# Patient Record
Sex: Female | Born: 1935
Health system: Southern US, Community
[De-identification: ages and names within clinical notes are randomized; demographics above are authoritative.]

## PROBLEM LIST (undated history)

## (undated) DIAGNOSIS — I739 Peripheral vascular disease, unspecified: Secondary | ICD-10-CM

## (undated) DIAGNOSIS — I5032 Chronic diastolic (congestive) heart failure: Secondary | ICD-10-CM

## (undated) DIAGNOSIS — I6529 Occlusion and stenosis of unspecified carotid artery: Secondary | ICD-10-CM

## (undated) DIAGNOSIS — K2970 Gastritis, unspecified, without bleeding: Secondary | ICD-10-CM

## (undated) DIAGNOSIS — J4489 Other specified chronic obstructive pulmonary disease: Secondary | ICD-10-CM

## (undated) DIAGNOSIS — I4891 Unspecified atrial fibrillation: Secondary | ICD-10-CM

## (undated) DIAGNOSIS — Z9289 Personal history of other medical treatment: Secondary | ICD-10-CM

## (undated) DIAGNOSIS — J449 Chronic obstructive pulmonary disease, unspecified: Secondary | ICD-10-CM

## (undated) DIAGNOSIS — R0602 Shortness of breath: Secondary | ICD-10-CM

## (undated) DIAGNOSIS — R609 Edema, unspecified: Secondary | ICD-10-CM

## (undated) DIAGNOSIS — M1990 Unspecified osteoarthritis, unspecified site: Secondary | ICD-10-CM

## (undated) DIAGNOSIS — K2990 Gastroduodenitis, unspecified, without bleeding: Secondary | ICD-10-CM

## (undated) DIAGNOSIS — I421 Obstructive hypertrophic cardiomyopathy: Secondary | ICD-10-CM

## (undated) DIAGNOSIS — I10 Essential (primary) hypertension: Secondary | ICD-10-CM

## (undated) DIAGNOSIS — J189 Pneumonia, unspecified organism: Secondary | ICD-10-CM

## (undated) DIAGNOSIS — E7800 Pure hypercholesterolemia, unspecified: Secondary | ICD-10-CM

## (undated) HISTORY — PX: CATARACT EXTRACTION W/ INTRAOCULAR LENS  IMPLANT, BILATERAL: SHX1307

## (undated) HISTORY — PX: PARTIAL GASTRECTOMY: SHX2172

## (undated) HISTORY — PX: PAROTID GLAND TUMOR EXCISION: SHX5221

## (undated) HISTORY — DX: Edema, unspecified: R60.9

## (undated) HISTORY — DX: Chronic obstructive pulmonary disease, unspecified: J44.9

## (undated) HISTORY — PX: CHOLECYSTECTOMY: SHX55

## (undated) HISTORY — DX: Essential (primary) hypertension: I10

## (undated) HISTORY — DX: Gastritis, unspecified, without bleeding: K29.70

## (undated) HISTORY — PX: CARDIAC CATHETERIZATION: SHX172

## (undated) HISTORY — DX: Gastroduodenitis, unspecified, without bleeding: K29.90

## (undated) HISTORY — DX: Unspecified atrial fibrillation: I48.91

## (undated) HISTORY — DX: Peripheral vascular disease, unspecified: I73.9

## (undated) HISTORY — DX: Obstructive hypertrophic cardiomyopathy: I42.1

## (undated) HISTORY — DX: Occlusion and stenosis of unspecified carotid artery: I65.29

## (undated) HISTORY — DX: Other specified chronic obstructive pulmonary disease: J44.89

## (undated) HISTORY — DX: Obstructive hypertrophic cardiomyopathy: I50.32

## (undated) HISTORY — PX: SHOULDER OPEN ROTATOR CUFF REPAIR: SHX2407

## (undated) HISTORY — PX: APPENDECTOMY: SHX54

## (undated) HISTORY — PX: TOTAL ABDOMINAL HYSTERECTOMY: SHX209

---

## 1997-06-17 ENCOUNTER — Ambulatory Visit (HOSPITAL_COMMUNITY): Admission: RE | Admit: 1997-06-17 | Discharge: 1997-06-17 | Payer: Self-pay | Admitting: *Deleted

## 1997-07-31 ENCOUNTER — Ambulatory Visit (HOSPITAL_COMMUNITY): Admission: RE | Admit: 1997-07-31 | Discharge: 1997-07-31 | Payer: Self-pay | Admitting: *Deleted

## 1998-06-01 ENCOUNTER — Ambulatory Visit (HOSPITAL_COMMUNITY): Admission: RE | Admit: 1998-06-01 | Discharge: 1998-06-01 | Payer: Self-pay | Admitting: *Deleted

## 1998-07-25 ENCOUNTER — Encounter: Payer: Self-pay | Admitting: *Deleted

## 1998-07-25 ENCOUNTER — Ambulatory Visit (HOSPITAL_COMMUNITY): Admission: RE | Admit: 1998-07-25 | Discharge: 1998-07-25 | Payer: Self-pay | Admitting: *Deleted

## 1998-09-24 ENCOUNTER — Ambulatory Visit (HOSPITAL_COMMUNITY): Admission: RE | Admit: 1998-09-24 | Discharge: 1998-09-24 | Payer: Self-pay | Admitting: *Deleted

## 1998-11-12 ENCOUNTER — Ambulatory Visit (HOSPITAL_COMMUNITY): Admission: RE | Admit: 1998-11-12 | Discharge: 1998-11-12 | Payer: Self-pay | Admitting: *Deleted

## 1998-11-12 ENCOUNTER — Encounter: Payer: Self-pay | Admitting: *Deleted

## 1998-11-23 ENCOUNTER — Ambulatory Visit (HOSPITAL_COMMUNITY): Admission: RE | Admit: 1998-11-23 | Discharge: 1998-11-23 | Payer: Self-pay | Admitting: *Deleted

## 1998-11-23 ENCOUNTER — Encounter: Payer: Self-pay | Admitting: *Deleted

## 1998-11-27 ENCOUNTER — Ambulatory Visit (HOSPITAL_COMMUNITY): Admission: RE | Admit: 1998-11-27 | Discharge: 1998-11-27 | Payer: Self-pay | Admitting: *Deleted

## 1999-01-01 ENCOUNTER — Encounter: Payer: Self-pay | Admitting: Neurology

## 1999-01-01 ENCOUNTER — Encounter: Admission: RE | Admit: 1999-01-01 | Discharge: 1999-01-01 | Payer: Self-pay | Admitting: Neurology

## 1999-01-04 ENCOUNTER — Encounter (HOSPITAL_BASED_OUTPATIENT_CLINIC_OR_DEPARTMENT_OTHER): Payer: Self-pay | Admitting: General Surgery

## 1999-01-07 ENCOUNTER — Encounter (INDEPENDENT_AMBULATORY_CARE_PROVIDER_SITE_OTHER): Payer: Self-pay | Admitting: Specialist

## 1999-01-07 ENCOUNTER — Encounter (HOSPITAL_BASED_OUTPATIENT_CLINIC_OR_DEPARTMENT_OTHER): Payer: Self-pay | Admitting: General Surgery

## 1999-01-07 ENCOUNTER — Ambulatory Visit (HOSPITAL_COMMUNITY): Admission: RE | Admit: 1999-01-07 | Discharge: 1999-01-08 | Payer: Self-pay | Admitting: General Surgery

## 2001-05-27 ENCOUNTER — Ambulatory Visit (HOSPITAL_COMMUNITY): Admission: RE | Admit: 2001-05-27 | Discharge: 2001-05-27 | Payer: Self-pay | Admitting: *Deleted

## 2001-05-27 ENCOUNTER — Encounter: Payer: Self-pay | Admitting: *Deleted

## 2001-12-13 ENCOUNTER — Encounter: Payer: Self-pay | Admitting: *Deleted

## 2001-12-13 ENCOUNTER — Encounter: Admission: RE | Admit: 2001-12-13 | Discharge: 2001-12-13 | Payer: Self-pay | Admitting: *Deleted

## 2002-12-15 ENCOUNTER — Encounter: Payer: Self-pay | Admitting: *Deleted

## 2002-12-15 ENCOUNTER — Encounter: Admission: RE | Admit: 2002-12-15 | Discharge: 2002-12-15 | Payer: Self-pay | Admitting: *Deleted

## 2003-03-13 ENCOUNTER — Emergency Department (HOSPITAL_COMMUNITY): Admission: EM | Admit: 2003-03-13 | Discharge: 2003-03-13 | Payer: Self-pay | Admitting: Emergency Medicine

## 2003-11-28 ENCOUNTER — Encounter: Payer: Self-pay | Admitting: Internal Medicine

## 2003-11-29 ENCOUNTER — Encounter: Admission: RE | Admit: 2003-11-29 | Discharge: 2003-11-29 | Payer: Self-pay | Admitting: Cardiology

## 2004-01-09 ENCOUNTER — Encounter: Admission: RE | Admit: 2004-01-09 | Discharge: 2004-01-09 | Payer: Self-pay | Admitting: Cardiology

## 2004-03-14 ENCOUNTER — Ambulatory Visit (HOSPITAL_COMMUNITY): Admission: RE | Admit: 2004-03-14 | Discharge: 2004-03-14 | Payer: Self-pay | Admitting: Cardiology

## 2004-12-31 ENCOUNTER — Ambulatory Visit (HOSPITAL_COMMUNITY): Admission: RE | Admit: 2004-12-31 | Discharge: 2004-12-31 | Payer: Self-pay | Admitting: Gastroenterology

## 2005-01-07 ENCOUNTER — Ambulatory Visit (HOSPITAL_COMMUNITY): Admission: RE | Admit: 2005-01-07 | Discharge: 2005-01-07 | Payer: Self-pay | Admitting: Gastroenterology

## 2005-01-22 ENCOUNTER — Encounter: Admission: RE | Admit: 2005-01-22 | Discharge: 2005-01-22 | Payer: Self-pay | Admitting: Cardiology

## 2005-03-11 ENCOUNTER — Encounter: Admission: RE | Admit: 2005-03-11 | Discharge: 2005-03-11 | Payer: Self-pay | Admitting: Cardiology

## 2005-08-12 ENCOUNTER — Encounter: Admission: RE | Admit: 2005-08-12 | Discharge: 2005-08-12 | Payer: Self-pay | Admitting: Cardiology

## 2005-08-27 ENCOUNTER — Encounter: Payer: Self-pay | Admitting: Internal Medicine

## 2005-08-27 ENCOUNTER — Ambulatory Visit (HOSPITAL_COMMUNITY): Admission: RE | Admit: 2005-08-27 | Discharge: 2005-08-27 | Payer: Self-pay | Admitting: Cardiology

## 2005-09-11 ENCOUNTER — Encounter: Admission: RE | Admit: 2005-09-11 | Discharge: 2005-09-11 | Payer: Self-pay | Admitting: Cardiology

## 2005-10-11 ENCOUNTER — Encounter: Admission: RE | Admit: 2005-10-11 | Discharge: 2005-10-11 | Payer: Self-pay | Admitting: Otolaryngology

## 2005-11-19 ENCOUNTER — Encounter (INDEPENDENT_AMBULATORY_CARE_PROVIDER_SITE_OTHER): Payer: Self-pay | Admitting: Specialist

## 2005-11-19 ENCOUNTER — Ambulatory Visit (HOSPITAL_COMMUNITY): Admission: RE | Admit: 2005-11-19 | Discharge: 2005-11-20 | Payer: Self-pay | Admitting: Otolaryngology

## 2006-04-10 ENCOUNTER — Ambulatory Visit (HOSPITAL_COMMUNITY): Admission: RE | Admit: 2006-04-10 | Discharge: 2006-04-11 | Payer: Self-pay | Admitting: Otolaryngology

## 2006-04-10 ENCOUNTER — Encounter (INDEPENDENT_AMBULATORY_CARE_PROVIDER_SITE_OTHER): Payer: Self-pay | Admitting: Specialist

## 2006-08-11 ENCOUNTER — Encounter: Payer: Self-pay | Admitting: Internal Medicine

## 2006-10-09 ENCOUNTER — Ambulatory Visit (HOSPITAL_COMMUNITY): Admission: RE | Admit: 2006-10-09 | Discharge: 2006-10-09 | Payer: Self-pay | Admitting: Cardiology

## 2007-03-10 ENCOUNTER — Encounter: Payer: Self-pay | Admitting: Internal Medicine

## 2007-03-12 ENCOUNTER — Encounter: Payer: Self-pay | Admitting: Internal Medicine

## 2008-05-31 ENCOUNTER — Encounter: Payer: Self-pay | Admitting: Internal Medicine

## 2008-09-05 ENCOUNTER — Encounter: Payer: Self-pay | Admitting: Internal Medicine

## 2008-10-17 ENCOUNTER — Encounter: Payer: Self-pay | Admitting: Internal Medicine

## 2008-11-22 ENCOUNTER — Encounter (INDEPENDENT_AMBULATORY_CARE_PROVIDER_SITE_OTHER): Payer: Self-pay | Admitting: *Deleted

## 2008-11-22 DIAGNOSIS — I4891 Unspecified atrial fibrillation: Secondary | ICD-10-CM

## 2008-11-22 DIAGNOSIS — J4489 Other specified chronic obstructive pulmonary disease: Secondary | ICD-10-CM | POA: Insufficient documentation

## 2008-11-22 DIAGNOSIS — F17200 Nicotine dependence, unspecified, uncomplicated: Secondary | ICD-10-CM

## 2008-11-22 DIAGNOSIS — I10 Essential (primary) hypertension: Secondary | ICD-10-CM

## 2008-11-22 DIAGNOSIS — K2990 Gastroduodenitis, unspecified, without bleeding: Secondary | ICD-10-CM

## 2008-11-22 DIAGNOSIS — J449 Chronic obstructive pulmonary disease, unspecified: Secondary | ICD-10-CM

## 2008-11-22 DIAGNOSIS — K2970 Gastritis, unspecified, without bleeding: Secondary | ICD-10-CM | POA: Insufficient documentation

## 2008-11-23 ENCOUNTER — Ambulatory Visit: Payer: Self-pay | Admitting: Internal Medicine

## 2008-11-23 DIAGNOSIS — R0602 Shortness of breath: Secondary | ICD-10-CM | POA: Insufficient documentation

## 2008-11-28 ENCOUNTER — Inpatient Hospital Stay (HOSPITAL_BASED_OUTPATIENT_CLINIC_OR_DEPARTMENT_OTHER): Admission: RE | Admit: 2008-11-28 | Discharge: 2008-11-28 | Payer: Self-pay | Admitting: Cardiology

## 2008-11-28 ENCOUNTER — Ambulatory Visit: Payer: Self-pay | Admitting: Cardiology

## 2008-12-04 ENCOUNTER — Ambulatory Visit: Payer: Self-pay

## 2008-12-04 ENCOUNTER — Ambulatory Visit (HOSPITAL_COMMUNITY): Admission: RE | Admit: 2008-12-04 | Discharge: 2008-12-04 | Payer: Self-pay | Admitting: Cardiovascular Disease

## 2008-12-04 ENCOUNTER — Encounter: Payer: Self-pay | Admitting: Internal Medicine

## 2008-12-04 ENCOUNTER — Ambulatory Visit: Payer: Self-pay | Admitting: Cardiovascular Disease

## 2008-12-07 ENCOUNTER — Encounter (INDEPENDENT_AMBULATORY_CARE_PROVIDER_SITE_OTHER): Payer: Self-pay | Admitting: *Deleted

## 2008-12-14 ENCOUNTER — Ambulatory Visit: Payer: Self-pay | Admitting: Internal Medicine

## 2008-12-19 ENCOUNTER — Ambulatory Visit: Payer: Self-pay | Admitting: Cardiovascular Disease

## 2008-12-19 DIAGNOSIS — I739 Peripheral vascular disease, unspecified: Secondary | ICD-10-CM | POA: Insufficient documentation

## 2009-01-05 ENCOUNTER — Ambulatory Visit: Payer: Self-pay

## 2009-01-05 ENCOUNTER — Encounter: Payer: Self-pay | Admitting: Cardiovascular Disease

## 2009-02-12 ENCOUNTER — Ambulatory Visit: Payer: Self-pay | Admitting: Cardiovascular Disease

## 2009-06-25 ENCOUNTER — Encounter: Payer: Self-pay | Admitting: Cardiovascular Disease

## 2009-06-25 ENCOUNTER — Ambulatory Visit: Payer: Self-pay

## 2009-06-25 ENCOUNTER — Telehealth: Payer: Self-pay | Admitting: Cardiovascular Disease

## 2009-06-25 DIAGNOSIS — R609 Edema, unspecified: Secondary | ICD-10-CM | POA: Insufficient documentation

## 2009-08-09 ENCOUNTER — Encounter: Payer: Self-pay | Admitting: Cardiovascular Disease

## 2009-08-10 ENCOUNTER — Ambulatory Visit: Payer: Self-pay | Admitting: Cardiovascular Disease

## 2009-09-24 ENCOUNTER — Telehealth: Payer: Self-pay | Admitting: Cardiovascular Disease

## 2009-10-04 ENCOUNTER — Encounter: Payer: Self-pay | Admitting: Cardiovascular Disease

## 2009-10-08 ENCOUNTER — Ambulatory Visit: Payer: Self-pay | Admitting: Cardiovascular Disease

## 2009-10-08 LAB — CONVERTED CEMR LAB
BUN: 18 mg/dL (ref 6–23)
Basophils Relative: 0.3 % (ref 0.0–3.0)
CO2: 32 meq/L (ref 19–32)
Calcium: 8.8 mg/dL (ref 8.4–10.5)
Eosinophils Relative: 1.8 % (ref 0.0–5.0)
GFR calc non Af Amer: 50.21 mL/min (ref >60)
Glucose, Bld: 73 mg/dL (ref 70–99)
HCT: 43.3 % (ref 36.0–46.0)
Hemoglobin: 14.9 g/dL (ref 12.0–15.0)
Lymphs Abs: 2.3 10*3/uL (ref 0.7–4.0)
MCV: 90.3 fL (ref 78.0–100.0)
Monocytes Absolute: 0.7 10*3/uL (ref 0.1–1.0)
Monocytes Relative: 8 % (ref 3.0–12.0)
RBC: 4.8 M/uL (ref 3.87–5.11)
Sodium: 144 meq/L (ref 135–145)
WBC: 8.3 10*3/uL (ref 4.5–10.5)

## 2009-10-10 ENCOUNTER — Ambulatory Visit (HOSPITAL_COMMUNITY): Admission: RE | Admit: 2009-10-10 | Discharge: 2009-10-10 | Payer: Self-pay | Admitting: Cardiovascular Disease

## 2009-10-10 ENCOUNTER — Ambulatory Visit: Payer: Self-pay | Admitting: Cardiovascular Disease

## 2009-10-12 ENCOUNTER — Telehealth (INDEPENDENT_AMBULATORY_CARE_PROVIDER_SITE_OTHER): Payer: Self-pay | Admitting: *Deleted

## 2009-10-29 ENCOUNTER — Ambulatory Visit: Payer: Self-pay | Admitting: Surgery

## 2009-10-29 ENCOUNTER — Encounter: Payer: Self-pay | Admitting: Internal Medicine

## 2009-10-31 ENCOUNTER — Ambulatory Visit: Payer: Self-pay | Admitting: Internal Medicine

## 2009-11-12 ENCOUNTER — Encounter: Payer: Self-pay | Admitting: Cardiovascular Disease

## 2009-11-12 ENCOUNTER — Ambulatory Visit: Payer: Self-pay | Admitting: Surgery

## 2009-11-29 ENCOUNTER — Inpatient Hospital Stay (HOSPITAL_COMMUNITY): Admission: RE | Admit: 2009-11-29 | Discharge: 2009-11-30 | Payer: Self-pay | Admitting: Surgery

## 2009-11-29 ENCOUNTER — Ambulatory Visit: Payer: Self-pay | Admitting: Surgery

## 2009-11-29 ENCOUNTER — Ambulatory Visit: Payer: Self-pay | Admitting: Cardiology

## 2009-12-01 DIAGNOSIS — Z9289 Personal history of other medical treatment: Secondary | ICD-10-CM

## 2009-12-01 HISTORY — DX: Personal history of other medical treatment: Z92.89

## 2009-12-06 ENCOUNTER — Ambulatory Visit: Payer: Self-pay | Admitting: Internal Medicine

## 2009-12-06 ENCOUNTER — Inpatient Hospital Stay (HOSPITAL_COMMUNITY): Admission: RE | Admit: 2009-12-06 | Discharge: 2009-12-25 | Payer: Self-pay | Admitting: Surgery

## 2009-12-06 ENCOUNTER — Encounter: Payer: Self-pay | Admitting: Surgery

## 2009-12-08 HISTORY — PX: AORTO-FEMORAL BYPASS GRAFT: SHX885

## 2009-12-13 ENCOUNTER — Encounter: Payer: Self-pay | Admitting: Cardiovascular Disease

## 2009-12-18 ENCOUNTER — Ambulatory Visit: Payer: Self-pay | Admitting: Infectious Diseases

## 2009-12-21 ENCOUNTER — Ambulatory Visit: Payer: Self-pay | Admitting: Physical Medicine & Rehabilitation

## 2009-12-25 ENCOUNTER — Telehealth: Payer: Self-pay | Admitting: Cardiovascular Disease

## 2009-12-26 ENCOUNTER — Ambulatory Visit: Payer: Self-pay | Admitting: Internal Medicine

## 2009-12-31 ENCOUNTER — Ambulatory Visit: Payer: Self-pay | Admitting: Surgery

## 2010-01-14 ENCOUNTER — Telehealth: Payer: Self-pay | Admitting: Infectious Diseases

## 2010-01-18 ENCOUNTER — Inpatient Hospital Stay (HOSPITAL_COMMUNITY)
Admission: EM | Admit: 2010-01-18 | Discharge: 2010-01-29 | Payer: Self-pay | Source: Home / Self Care | Admitting: Emergency Medicine

## 2010-01-23 ENCOUNTER — Encounter (INDEPENDENT_AMBULATORY_CARE_PROVIDER_SITE_OTHER): Payer: Self-pay | Admitting: Cardiology

## 2010-01-29 ENCOUNTER — Encounter: Payer: Self-pay | Admitting: Cardiovascular Disease

## 2010-01-29 DIAGNOSIS — I6529 Occlusion and stenosis of unspecified carotid artery: Secondary | ICD-10-CM | POA: Insufficient documentation

## 2010-02-07 ENCOUNTER — Encounter: Payer: Self-pay | Admitting: Pulmonary Disease

## 2010-02-07 ENCOUNTER — Inpatient Hospital Stay (HOSPITAL_COMMUNITY)
Admission: EM | Admit: 2010-02-07 | Discharge: 2010-02-14 | Payer: Self-pay | Source: Home / Self Care | Attending: Cardiology | Admitting: Cardiology

## 2010-02-11 ENCOUNTER — Ambulatory Visit: Payer: Self-pay | Admitting: Surgery

## 2010-03-31 LAB — CONVERTED CEMR LAB
BUN: 22 mg/dL (ref 6–23)
Basophils Relative: 0.7 % (ref 0.0–3.0)
Bilirubin, Direct: 0.1 mg/dL (ref 0.0–0.3)
CO2: 31 meq/L (ref 19–32)
Chloride: 101 meq/L (ref 96–112)
Creatinine, Ser: 1.2 mg/dL (ref 0.4–1.2)
Direct LDL: 185.9 mg/dL
Eosinophils Absolute: 0.1 10*3/uL (ref 0.0–0.7)
HCT: 48.2 % — ABNORMAL HIGH (ref 36.0–46.0)
HDL: 32.1 mg/dL — ABNORMAL LOW (ref >39.00)
INR: 1 (ref 0.8–1.0)
Lymphs Abs: 2.2 10*3/uL (ref 0.7–4.0)
MCHC: 34.1 g/dL (ref 30.0–36.0)
MCV: 89.6 fL (ref 78.0–100.0)
Monocytes Absolute: 0.7 10*3/uL (ref 0.1–1.0)
Neutro Abs: 5.7 10*3/uL (ref 1.4–7.7)
Neutrophils Relative %: 64.8 % (ref 43.0–77.0)
Potassium: 4.3 meq/L (ref 3.5–5.1)
RBC: 5.38 M/uL — ABNORMAL HIGH (ref 3.87–5.11)
Total Bilirubin: 1 mg/dL (ref 0.3–1.2)
Total CHOL/HDL Ratio: 8
Triglycerides: 192 mg/dL — ABNORMAL HIGH (ref 0.0–149.0)
VLDL: 38.4 mg/dL (ref 0.0–40.0)

## 2010-04-02 NOTE — Consult Note (Signed)
Summary: Theotis Burrow IV MD  Theotis Burrow IV MD   Imported By: Phillis Knack 11/06/2009 14:50:23  _____________________________________________________________________  External Attachment:    Type:   Image     Comment:   External Document

## 2010-04-02 NOTE — Miscellaneous (Signed)
Summary: Orders Update  Clinical Lists Changes  Problems: Added new problem of CAROTID ARTERY DISEASE (ICD-433.10) Orders: Added new Test order of Carotid Duplex (Carotid Duplex) - Signed 

## 2010-04-02 NOTE — Progress Notes (Signed)
Summary: pt having pain in swelling in left leg  Phone Note Call from Patient Call back at Home Phone [phone]   Caller: Patient Reason for Call: Talk to Nurse, Talk to Doctor Summary of Call: pt having trouble with pain and swelling with her left leg and she has fallen twice prior to this pain and swelling situatuation. She has an appt on june 10th for abi and appt with MD but she wanted to talk to someone about what was going on Initial call taken by: Shelda Pal,  June 25, 2009 9:32 AM  Follow-up for Phone Call        pt calling c/o increased swelling in left leg and right foot-- states" feels like i'm walking on air" no sensation on bottom of feet--swelling on left" twice the size of normal--swelling right foot and pain--spoke with dr bensimhon who advised to bring pt in for lower extremetry dopplers--pt advised and agrees--will refer to dr spruill once testing done--has an appoint with dr Angelena Form in  june--nt Follow-up by: Leodis Sias, RN,  June 25, 2009 10:16 AM     Appended Document: pt having pain in swelling in left leg Pat, Can we check with her today and make sure the dopplers were performed? thanks, cdm  Appended Document: pt having pain in swelling in left leg done 06/25/09

## 2010-04-02 NOTE — Assessment & Plan Note (Signed)
Summary: Pulmonary/ new pt eval for preop clearance, minimal copd   Visit Type:  Initial Consult Copy to:  Dr. Trula Slade Primary Provider/Referring Provider:  Rene Kocher, MD  CC:  COPD eval/Needs surgery clearance.  History of Present Illness: 77 yowf still smoking never limited by breathing but frequent epsisodes slowed down x 2008 by right leg claudication and now in need of vascular sugery referred by Dr Russella Dar for clearance.  October 31, 2009  1st pulmonary office eval not limited by breathing, no noct or early am exac or sob or cough,  no recent  pft's.   Pt denies any significant sore throat, dysphagia, itching, sneezing,  nasal congestion or excess secretions,  fever, chills, sweats, unintended wt loss, pleuritic or exertional cp, hempoptysis, change in activity tolerance  orthopnea pnd or leg swelling Pt also denies any obvious fluctuation in symptoms with weather or environmental change or other alleviating or aggravating factors.       Preventive Screening-Counseling & Management  Alcohol-Tobacco     Smoking Status: current     Smoking Cessation Counseling: yes     Packs/Day: 1/2  Current Medications (verified): 1)  Diovan 160 Mg Tabs (Valsartan) .Marland Kitchen.. 1 Tablet Daily 2)  Metoprolol Tartrate 50 Mg Tabs (Metoprolol Tartrate) .Marland Kitchen.. 1 To 2 Tabs A Day Per Pt 3)  Benadryl Allergy and Cold .... As Directed As Needed 4)  Combivent 103-18 Mcg/act Aero (Ipratropium-Albuterol) .... 2 Spray  As Needed For Wheezing  Allergies (verified): 1)  ! Sulfa 2)  ! Nsaids  Past History:  Past Medical History: ONGOING TOBACCO ABUSE (ICD-305.1) COPD (ICD-496)      - PFT's October 31, 2009 FEV1  1.83 (115%) ratio 64 GASTRITIS (ICD-535.50) HYPERTENSION (ICD-401.9) SVT ALLERGIC RHINITIS PVD............................................................Marland KitchenBradham    -occluded right external  iliac artery, moderate to severe disease left  iliac system.   Family History: Reviewed history from  12/19/2008 and no changes required. Mother had CAD.   Sister has CAD.   Father had a stroke.  Social History: Pt lives in [place] with her son.  She is retired from [employer] information [employer] manager.  She smokes 1/2 pack a day (55+ pkyr), she has cut back lately ETOH- none.   Drugs- none Widow 2 childrenPacks/Day:  1/2 Smoking Status:  current  Review of Systems       The patient complains of shortness of breath with activity, productive cough, irregular heartbeats, loss of appetite, weight change, and hand/feet swelling.  The patient denies shortness of breath at rest, non-productive cough, coughing up blood, chest pain, acid heartburn, indigestion, abdominal pain, difficulty swallowing, sore throat, tooth/dental problems, headaches, nasal congestion/difficulty breathing through nose, sneezing, itching, ear ache, anxiety, depression, joint stiffness or pain, rash, change in color of mucus, and fever.    Vital Signs:  Patient profile:   77 year old female Weight:      129 pounds O2 Sat:      97 % on Room air Temp:     97.1 degrees F oral Pulse rate:   57 / minute BP sitting:   132 / 90  (left arm)  Vitals Entered By: Tilden Dome (October 31, 2009 3:21 PM)  O2 Flow:  Room air  Physical Exam  Additional Exam:  amb chronically ill appearing wf nad HEENT mild turbinate edema.  Oropharynx no thrush or excess pnd or cobblestoning.  No JVD or cervical adenopathy. Mild accessory muscle hypertrophy. Trachea midline, nl thryroid. Chest was hyperinflated by percussion with diminished breath sounds and moderate increased exp  time without wheeze. Hoover sign positive at mid inspiration. Regular rate and rhythm without murmur gallop or rub or increase P2 or edema.  Abd: no hsm, nl excursion. Ext warm without cyanosis or clubbing.     CXR  Procedure date:  10/31/2009  Findings:      Changes of COPD.  No acute changes.  Pulmonary Function Test Date: 10/31/2009 4:03 PM Gender: Female  Pre-Spirometry FVC     Value: 2.87 L/min   % Pred: 133.50 % FEV1    Value: 1.83 L     Pred: 1.59 L     % Pred: 115 % FEV1/FVC  Value: 63.80 %     % Pred: 85.50 %  Impression & Recommendations:  Problem # 1:  COPD (ICD-496) Minimal severity no sign active bronchitis, bronchospasm or limiting  symptoms but really needs to quit smoking at least 2 weeks preop or face sign chance of post op resp complications, discussed.   I took this opportunity to educate the patient regarding the consequences of smoking in airway disorders based on all the data we have from the multiple national lung health studies indicating that smoking cessation, not choice of inhalers or physicians, is the most important aspect of care.    Problem # 2:  TOBACCO ABUSE (ICD-305.1) Discussed but not ready to committ to quit at this point - emphasized risks involved in continuing smoking and that patient should consider these in the context of the cost of smoking relative to the benefit obtained.  See instructions for specific recommendations   Medications Added to Medication List This Visit: 1)  Combivent 103-18 Mcg/act Aero (Ipratropium-albuterol) .... 2 spray  as needed for wheezing  Other Orders: Spirometry w/Graph (94010) New Patient Level V [id number] T-2 View CXR (71020TC)  Patient Instructions: 1)  The most important aspect of care is stopping smoking for at least 2 weeks before surgery 2)  use combivent as needed  3)  You are cleared for surgery but you will have higher risk of pulmonary complications which are not prohibitive when taken in the context of severe arterial insufficiency which risks losing your legs   CardioPerfect Spirometry  ID: [id number] Patient: Brenda [name], Brenda [name] DOB: 1931/05/25 Age: 77 Years Old Sex: Female Race: White Physician: wert,michael Height: 59 Weight: 129 Smoker: Yes PPD: 1/2 Has Pacemaker? No Status: Unconfirmed Past Medical History:  ONGOING TOBACCO ABUSE (ICD-305.1) COPD  (ICD-496) GASTRITIS (ICD-535.50) HYPERTENSION (ICD-401.9) SVT ALLERGIC RHINITIS PVD-occluded right external  iliac artery, moderate to severe disease left  iliac system.   Recorded: 10/31/2009 4:03 PM  Parameter  Measured Predicted %Predicted FVC     2.87        2.15        133.50 FEV1     1.83        1.59        115 FEV1%   63.80        74.66        85.50 PEF    2.91        4.37        66.50   Interpretation:

## 2010-04-02 NOTE — Letter (Signed)
Summary: [facility] Visit   [facility] Visit   Imported By: Sallee Provencal 12/17/2009 17:15:52  _____________________________________________________________________  External Attachment:    Type:   Image     Comment:   External Document

## 2010-04-02 NOTE — Letter (Signed)
Summary: Peripheral Vascular  [place] [place] [address] [place]   [place], [place] [address]   Phone: [phone]  Fax: [phone]     10/04/2009 MRN: [id number]  [place] [employer] [place], [place]  [address]  Dear Ms. Friscia,   You are scheduled for Peripheral Vascular Angiogram on  October 10, 2009             with Dr.Trude Cansler            .  Please arrive at the [facility] at  8:00     a.m. on the day of your procedure.  1. DIET     ___X_ Nothing to eat or drink after midnight except your medications with a sip of water.  2. Come to the [place] office on  October 08, 2009           for lab work.  The lab at [facility] is open from 8:30 a.m. to 1:30 p.m. and 2:30 p.m. to 5:00 p.m.  The lab at [facility] is open from 7:30 a.m. to 5:30 p.m.  You do not have to be fasting.  3. MAKE SURE YOU TAKE YOUR ASPIRIN.  4.    ___X_ YOU MAY TAKE ALL of your  medications with a small amount of water.      5. Plan for one night stay - bring personal belongings (i.e. toothpaste, toothbrush, etc.)  6. Bring a current list of your medications and current insurance cards.  7. Must have a responsible person to drive you home.   8. Someone must be with you for the first 24 hours after you arrive home.  9. Please wear clothes that are easy to get on and off and wear slip-on shoes.  *Special note: Every effort is made to have your procedure done on time.  Occasionally there are emergencies that present themselves at the hospital that may cause delays.  Please be patient if a delay does occur.  If you have any questions after you get home, please call the office at the number listed above.   Thompson Grayer, RN, BSN

## 2010-04-02 NOTE — Progress Notes (Signed)
  Phone Note Outgoing Call   Call placed by: Fredia Beets, RN,  October 12, 2009 2:38 PM Summary of Call: per dr Angelena Form pt needs referral to dr Trula Slade at [facility]. pt aware.

## 2010-04-02 NOTE — Miscellaneous (Signed)
Summary: Orders Update  Clinical Lists Changes  Orders: Added new Test order of Arterial Duplex Lower Extremity (Arterial Duplex Low) - Signed 

## 2010-04-02 NOTE — Assessment & Plan Note (Signed)
Summary: eph/jml   Visit Type:  Follow-up Referring Provider:  Dr. Trula Slade Primary Provider:  Rene Kocher, MD   History of Present Illness: Brenda [name] presents today for follow-up after her recent hospitalization.  She was just discharged yesterday.She underwent aortobifemoral bypass on 12/08/09.  Her hospital course was complicated by afib and required cardioversion by Dr Caryl Comes.  She was initiated on amiodarone and pradaxa.  She subsequently had dehisence of her R groin wound 12/15/09 requiring further surgery.  She was discharged in sinus rhythm yesterday.   Today, she returns for further evaluation.  Unfortunately, she states that she was unable to afford any of her medicines.  She has not taken her pradaxa or amiodarone and has returned to afib. She appears to be minimally symptomatic at this time.  She denies CP, SOB, palpitations, presyncope, or syncope.  She is otherwise without complaint.  Current Medications (verified): 1)  Oxycodone Hcl 5 Mg Tabs (Oxycodone Hcl) .... Uad 2)  Ipratropium Bromide 0.02 % Soln (Ipratropium Bromide) .... Uad 3)  Digoxin 0.125 Mg Tabs (Digoxin) .... 1/2 Tablet  Once Daily 4)  Amiodarone Hcl 200 Mg Tabs (Amiodarone Hcl) .... Take One Tablet By Mouth Twice A Day 5)  Pradaxa 150 Mg Caps (Dabigatran Etexilate Mesylate) .... Two Times A Day 6)  Potassium Chloride Crys Cr 20 Meq Cr-Tabs (Potassium Chloride Crys Cr) .... Take One Tablet By Mouth Daily  Allergies (verified): 1)  ! Sulfa 2)  ! Nsaids  Past History:  Past Medical History: TOBACCO ABUSE- quit x 7 weeks COPD (ICD-496)      - PFT's October 31, 2009 FEV1  1.83 (115%) ratio 64 GASTRITIS (ICD-535.50) HYPERTENSION (ICD-401.9) Persistent afib ALLERGIC RHINITIS PVD s/p aortobifemoral bypass 12/08/09  Past Surgical History: TAH for bleeding Cholecystectomy Gastrectomy/ vagotomy for gastric ulcers Rotator cuff repair Parotid gland tumors removed aortobifemoral bypass 12/08/09  Family  History: Reviewed history from 12/19/2008 and no changes required. Mother had CAD.   Sister has CAD.   Father had a stroke.  Social History: Pt lives in [place] with her son.  She is retired from [employer] manager.  She smokes 1/2 pack a day (55+ pkyr), she quit x 7 weeks ETOH- none.   Drugs- none Widow 2 children  Review of Systems       All systems are reviewed and negative except as listed in the HPI.   Vital Signs:  Patient profile:   77 year old female Height:      59 inches Weight:      129 pounds Pulse rate:   124 / minute BP sitting:   102 / 66  (left arm)  Vitals Entered By: Margaretmary Bayley CMA (December 26, 2009 12:39 PM)  Physical Exam  General:  chronicall ill, in a wheelchair today Head:  normocephalic and atraumatic Eyes:  PERRLA/EOM intact; conjunctiva and lids normal. Mouth:  Teeth, gums and palate normal. Oral mucosa normal. Neck:  supple, JVP 8cm Lungs:  prolonged expiratory phase, no wheezes or rales Heart:  tachy irregular rhythm, no m/r/g Abdomen:  Bowel sounds positive; abdomen soft and non-tender without masses, organomegaly, or hernias noted. No hepatosplenomegaly. Msk:  diffuse muscle atrophy Extremities:  No clubbing or cyanosis.  1+ BLE edema Neurologic:  Alert and oriented x 3.   EKG  Procedure date:  12/26/2009  Findings:      afib,  V rate 124, nonspecific ST/T changes  Impression & Recommendations:  Problem # 1:  ATRIAL FIBRILLATION (ICD-427.31) The patient returns today for follow-up  after her recent hospitalization.  Though she left the hospital yesterday in sinus rhythm, she has returned to afib with RVR.  She reports noncompliance with amiodarone and also pradaxa due to finances.  I have stressed the importance of complaince with these medicines today.  I have given her samples (12 days) of pradaxa and also provided her with the phone number for the pradaxa indigent program ([facility]).     Problem # 2:  HYPERTENSION  (ICD-401.9) stable no changes today  Problem # 3:  SHORTNESS OF BREATH (ICD-786.05) stable tobacco cessation encouraged  Problem # 4:  PVD (ICD-443.9) s/p aortobifemoral bypass no changes today  Problem # 5:  EDEMA (ICD-782.3) salt restriction  Other Orders: EKG w/ Interpretation (93000)  Patient Instructions: 1)  Your physician recommends that you schedule a follow-up appointment in: [place] 2)  Your physician has requested that you increase the amount of potassium in your diet. Please see [facility] handout. BANANA once daily 3)  MAKE SURE TAKE AMIODARONE 4)  Your physician has recommended you make the following change in your medication:  ADDED PRADAXA  two times a day

## 2010-04-02 NOTE — Progress Notes (Signed)
Summary: [facility] calling re: PICC  Phone Note From Other Clinic   Caller: [facility] [place]  [phone] Summary of Call: Pt is due to finish IV Atbx today. Can PICC be pulled?  Pt has no scheduled visits with Dr Johnnye Sima. She was seeen  in hospital as consult for IV Atbx   Patient's son says the surgeon said the wound looked fine at the visit last week.  No OV available with Dr Johnnye Sima until 01-28-10.   I called [facility],  verbal order per  Dr Johnnye Sima  ok to remove PICC after atbx.  completed.    Orland Mustard RN  January 14, 2010 12:05 PM  Initial call taken by: Orland Mustard RN,  January 14, 2010 12:07 PM

## 2010-04-02 NOTE — Miscellaneous (Signed)
Summary: Orders Update  Clinical Lists Changes  Problems: Added new problem of EDEMA (ICD-782.3) Orders: Added new Test order of Venous Duplex Lower Extremity (Venous Duplex Lower) - Signed

## 2010-04-02 NOTE — Progress Notes (Signed)
Summary: pt calling to set up study on her legs  Phone Note Call from Patient Call back at Home Phone [phone]   Caller: Patient Reason for Call: Talk to Nurse, Talk to Doctor Summary of Call: pt is calling to set up a study for her legs Initial call taken by: Shelda Pal,  September 24, 2009 4:27 PM  Follow-up for Phone Call        spoke with pt, she is ready to schedule an angiogram of her legs as per dr Angelena Form last office note. next PV day for dr Angelena Form is 10-10-09. will foward to pat, dr Camillia Herter nurse to see if can be done sooner than 10-10-09. pt aware will call her next week when dr Angelena Form returns Fredia Beets, RN  September 24, 2009 4:58 PM \\par   Additional Follow-up for Phone Call Additional follow up Details #1::        OK to schedule. cdm Additional Follow-up by: Lauree Chandler, MD,  October 02, 2009 5:56 PM    Additional Follow-up for Phone Call Additional follow up Details #2::    Spoke with pt and arranged procedure for October 10, 2009 at 10:00. Pt. will come in for lab work on October 08, 2009. I gave pt instructions verbally and told her I would leave at front desk for her to pick up when here for labs on August 8. She verbalizes understanding of all instructions. Follow-up by: Thompson Grayer, RN, BSN,  October 04, 2009 5:46 PM

## 2010-04-02 NOTE — Progress Notes (Signed)
Summary: medication question  Phone Note Call from Patient Call back at Home Phone [phone]   Caller: Son/ kenneth Summary of Call: Pt son ahve question about pt medication Initial call taken by: Delsa Sale,  December 25, 2009 4:22 PM  Follow-up for Phone Call        tried to call pt x2.  Whitney Jannett Celestine RN  December 25, 2009 5:09 PM  Pt. can't afford Pradaxa but does not want to start coumadin right now..? She has a f/u appt with [facility] tomorrow so they can discuss the options at the ov. Whitney Jannett Celestine RN  December 25, 2009 5:41 PM  Follow-up by: Whitney Jannett Celestine RN,  December 25, 2009 5:09 PM

## 2010-04-02 NOTE — Assessment & Plan Note (Signed)
Summary: 6 month follow up/443.9/pla   Visit Type:  6 mo f/u Referring Provider:  Wallene Huh, MD Primary Provider:  Rene Kocher, MD  CC:  weakness legs.  History of Present Illness: 77 yo WF with history of HTN, ongoing tobacco abuse, non-obstructive CAD, COPD and SVT with known history of PVD here today for PV follow up. She has been followed by Dr. Montez Morita for her cardiac issues.  During a left heart cath on 11/28/08, she was found to have a totally occluded right external iliac artery and high grade disease in the left iliac system. I referred her for lower extremity arterial dopplers and carotid artery dopplers. Carotids showed mild bilateral disease, 0-39% on right and left. ABI at that time showed right ABI 0.45, left ABI 0.93.   She is here today for follow up. She is having unsteadiness now in her gait that she contributes to weakness in her legs. She has no claudication or rest pain. When she walks, calf muscles get "sore". There are no ulcerations over the lower extremities. She does have weakness in both of her legs after prolonged periods of standing. She continues to smoke. She tells me that she has tried to stop but has been under too much stress. Her non-invasive studies today show improved ABI on the right 0.60, mildly decreased on left at 0.83.   Current Medications (verified): 1)  Diovan 160 Mg Tabs (Valsartan) .Marland Kitchen.. 1 Tablet Daily 2)  Metoprolol Tartrate 50 Mg Tabs (Metoprolol Tartrate) .Marland Kitchen.. 1 To 2 Tabs A Day Per Pt 3)  Benadryl Allergy and Cold .... As Directed As Needed 4)  Combivent 103-18 Mcg/act Aero (Ipratropium-Albuterol) .... 2 Spray  As Needed For Wheezing(Not Started) 5)  Simvastatin 40 Mg Tabs (Simvastatin) .... Take One Tablet By Mouth Daily At Bedtime(Not Started Yet)  Allergies: 1)  ! Sulfa 2)  ! Nsaids  Past History:  Past Medical History: ONGOING TOBACCO ABUSE (ICD-305.1) COPD (ICD-496) GASTRITIS (ICD-535.50) HYPERTENSION  (ICD-401.9) SVT ALLERGIC RHINITIS PVD-occluded right external  iliac artery, moderate to severe disease left  iliac system.   Social History: Reviewed history from 12/19/2008 and no changes required. Pt lives in [place] with her son.  She is retired from [employer] manager.  She smokes 1 pack a day (55+ pkyr), she has cut back lately ETOH- none.   Drugs- none Widow 2 children  Review of Systems  The patient denies fatigue, malaise, fever, weight gain/loss, vision loss, decreased hearing, hoarseness, chest pain, palpitations, shortness of breath, prolonged cough, wheezing, sleep apnea, coughing up blood, abdominal pain, blood in stool, nausea, vomiting, diarrhea, heartburn, incontinence, blood in urine, muscle weakness, joint pain, leg swelling, rash, skin lesions, headache, fainting, dizziness, depression, anxiety, enlarged lymph nodes, easy bruising or bleeding, and environmental allergies.         See HPI  Vital Signs:  Patient profile:   77 year old female Height:      59 inches Weight:      130 pounds BMI:     26.35 Pulse rate:   78 / minute Pulse rhythm:   regular BP sitting:   116 / 70  (left arm) Cuff size:   large  Vitals Entered By: Julaine Hua, CMA (August 10, 2009 3:32 PM)  Physical Exam  General:  General: Well developed, well nourished, NAD. Smells of tobacco. Psychiatric: Mood and affect normal Neck: No JVD, No carotid bruits, no thyromegaly, no lymphadenopathy. Lungs:Clear bilaterally, no wheezes, rhonci, crackles but overall decreased air movement bilaterally CV: RRR  no murmurs, gallops rubs Abdomen: soft, NT, ND, BS present Extremities: No edema, pulses are non-palpable bilateral DP/PT.  No ulcerations. Sensation intact.   ABI's  Procedure date:  08/10/2009  Findings:      Right ABI 0.6, Left 0.83. (Improved on right and slightly decreased on left)  Impression & Recommendations:  Problem # 1:  PVD (ICD-443.9) She has overall stable bilateral lower  extremity disease.  I have once again offered angiography to get a better assessment of her lower extremity disease. She is not sure that she wishes to pursue angiography at this time. I have reviewed the procedure and told her to call us if she wishes to move forward with scheduling. If she does, we will arrange on one of my PV days. I will plan repeat ABI in 6 months with clinical follow up at that time.   Problem # 2:  TOBACCO ABUSE (ICD-305.1) She is encouraged to stop smoking. She does not wish to stop. I have explained that continued tobacco use will lead to progression of her vascular disease and would most likely be detrimental to any PV procedures that  are performed in the future.   Patient Instructions: 1)  Your physician recommends that you schedule a follow-up appointment in: 6 months 2)  Your physician has requested that you have an ankle brachial index (ABI). During this test an ultrasound and blood pressure cuff are used to evaluate the arteries that supply the arms and legs with blood. Allow thirty minutes for this exam. There are no restrictions or special instructions. To be done in 6 months

## 2010-04-04 NOTE — Consult Note (Signed)
Summary: [facility]   Imported By: Phillis Knack 02/12/2010 09:06:38  _____________________________________________________________________  External Attachment:    Type:   Image     Comment:   External Document

## 2010-05-02 ENCOUNTER — Other Ambulatory Visit: Payer: Self-pay | Admitting: Cardiology

## 2010-05-02 DIAGNOSIS — Z1239 Encounter for other screening for malignant neoplasm of breast: Secondary | ICD-10-CM

## 2010-05-09 ENCOUNTER — Ambulatory Visit: Payer: Self-pay

## 2010-05-09 NOTE — Discharge Summary (Signed)
NAMEDANYLE, NICHOLL              ACCOUNT NO.:  [id number]  MEDICAL RECORD NO.:  [id number]          PATIENT TYPE:  INP  LOCATION:  4714                         FACILITY:  [facility]  PHYSICIAN:  Ardyth Gal. Janele Lague, M.D.DATE OF BIRTH:  [birth year]  DATE OF ADMISSION:  02/07/2010 DATE OF DISCHARGE:  02/14/2010                              DISCHARGE SUMMARY   DISCHARGE DIAGNOSES: 1. Diverticulitis. 2. Gastrointestinal bleeding. 3. Peripheral vascular disease.  Ms. Brenda [name] is a 77 year old patient who presented to the [facility] with a complaint of lower GI bleeding on February 07, 2010.  She was seen initially in the emergency department.  At the time of her admission to the emergency department, she reported blood in the stool. She reported a crampy lower abdomen pain with a bloody bowel movement. It is of note that the patient has had a history of similar events, and it was thought at that time to be related to an arterial venous malformation.  The patient was evaluated.  She was transfused with 2 units of packed cells due to anemia and was subsequently admitted for evaluation of this GI bleed situation.  It is of note that this patient suffers from atrial fibrillation.  During the emergency department evaluation, she was in a sinus rhythm.  A surgical consultation was obtained.  It was the opinion that the patient should first have a colonoscopy and if the bleeding continued, then an evaluation for possible colectomy would be done.  On February 07, 2010, the INR was 1.98.  The plan at that time was for transfusion of 2 units of fresh frozen plasma.  The patient was seen by pulmonary critical care.  The patient was seen by the GI specialist who recommended that the patient have two large bore IVs, transferred to intensive care bed as she had become hypotensive.  Her hemoglobin continued to be low.  Plans were also made for a bleeding scan.  The patient was also considered  for an angio test to find the source of the bleeding.  Because of the patient's history of atrial fibrillation and problems with bleeding, decision had to be made concerning the use of anticoagulation, and after speaking with the EP doctor as well as the consulting physicians, it was the opinion to hold this for now and to perhaps try aspirin in the distant future.  The hemoglobin and hematocrit gradually improved.  The patient stabilized.  The patient complained of feeling weak on February 12, 2010, however, the vital signs remained stable and plans were begun to look at possible discharge.  The patient was seen by Occupational Therapy.  Consultation was again held with the EP consultants and the risk versus benefits of not attempting the anticoagulation were discussed with the patient and family, and they are aware of the need right now to hold the anticoagulation.  On February 14, 2010, the patient was noted to have a stable hemoglobin and hematocrit.  There was no gross bleeding noted.  Plans were made for the patient to be discharged home.  The patient is to be seen in the office on February 26, 2010.  It  was emphasized to the patient to notify the physician immediately if any changes, problems, or concerns.  MEDICATIONS AT DISCHARGE: 1. Albuterol inhaler 1 puff every 4 hours. 2. Amiodarone 200 mg daily. 3. Diltiazem CD 180 mg one every morning. 4. Furosemide 20 mg daily. 5. Metoprolol 50 mg twice a day. 6. Potassium 20 mEq daily. 7. Protonix 40 mg daily.  The patient has been advised to stop the dabigatran 150 mg.     Lily Kocher, P.A.   ______________________________ Ardyth Gal. Elias Dennington, M.D.    HB/MEDQ  D:  04/16/2010  T:  04/17/2010  Job:  [id number]  Electronically Signed by Lily Kocher P.A. on 04/25/2010 12:34:15 PM Electronically Signed by Wallene Huh M.D. on 05/08/2010 07:54:41 PM

## 2010-05-13 LAB — HEMOGLOBIN AND HEMATOCRIT, BLOOD
HCT: 24.7 % — ABNORMAL LOW (ref 36.0–46.0)
HCT: 25.6 % — ABNORMAL LOW (ref 36.0–46.0)
HCT: 26.3 % — ABNORMAL LOW (ref 36.0–46.0)
HCT: 27.6 % — ABNORMAL LOW (ref 36.0–46.0)
Hemoglobin: 7.7 g/dL — ABNORMAL LOW (ref 12.0–15.0)
Hemoglobin: 8.4 g/dL — ABNORMAL LOW (ref 12.0–15.0)
Hemoglobin: 8.6 g/dL — ABNORMAL LOW (ref 12.0–15.0)
Hemoglobin: 9.3 g/dL — ABNORMAL LOW (ref 12.0–15.0)

## 2010-05-13 LAB — PROTIME-INR
INR: 1.48 (ref 0.00–1.49)
INR: 1.67 — ABNORMAL HIGH (ref 0.00–1.49)
INR: 1.98 — ABNORMAL HIGH (ref 0.00–1.49)
Prothrombin Time: 15.3 seconds — ABNORMAL HIGH (ref 11.6–15.2)
Prothrombin Time: 18.1 seconds — ABNORMAL HIGH (ref 11.6–15.2)
Prothrombin Time: 22.7 seconds — ABNORMAL HIGH (ref 11.6–15.2)

## 2010-05-13 LAB — CBC
HCT: 18.2 % — ABNORMAL LOW (ref 36.0–46.0)
HCT: 24.9 % — ABNORMAL LOW (ref 36.0–46.0)
HCT: 25.1 % — ABNORMAL LOW (ref 36.0–46.0)
HCT: 25.5 % — ABNORMAL LOW (ref 36.0–46.0)
HCT: 26.4 % — ABNORMAL LOW (ref 36.0–46.0)
HCT: 28.1 % — ABNORMAL LOW (ref 36.0–46.0)
Hemoglobin: 11.3 g/dL — ABNORMAL LOW (ref 12.0–15.0)
Hemoglobin: 6.1 g/dL — CL (ref 12.0–15.0)
Hemoglobin: 8.1 g/dL — ABNORMAL LOW (ref 12.0–15.0)
Hemoglobin: 8.2 g/dL — ABNORMAL LOW (ref 12.0–15.0)
Hemoglobin: 8.3 g/dL — ABNORMAL LOW (ref 12.0–15.0)
Hemoglobin: 8.4 g/dL — ABNORMAL LOW (ref 12.0–15.0)
Hemoglobin: 8.8 g/dL — ABNORMAL LOW (ref 12.0–15.0)
Hemoglobin: 9 g/dL — ABNORMAL LOW (ref 12.0–15.0)
MCH: 26.5 pg (ref 26.0–34.0)
MCH: 26.8 pg (ref 26.0–34.0)
MCH: 27.1 pg (ref 26.0–34.0)
MCH: 27.3 pg (ref 26.0–34.0)
MCH: 28.5 pg (ref 26.0–34.0)
MCHC: 32 g/dL (ref 30.0–36.0)
MCHC: 32 g/dL (ref 30.0–36.0)
MCHC: 32.5 g/dL (ref 30.0–36.0)
MCHC: 32.7 g/dL (ref 30.0–36.0)
MCHC: 32.7 g/dL (ref 30.0–36.0)
MCV: 83.6 fL (ref 78.0–100.0)
MCV: 83.7 fL (ref 78.0–100.0)
MCV: 84.2 fL (ref 78.0–100.0)
MCV: 88.9 fL (ref 78.0–100.0)
MCV: 89.1 fL (ref 78.0–100.0)
Platelets: 123 10*3/uL — ABNORMAL LOW (ref 150–400)
Platelets: 126 10*3/uL — ABNORMAL LOW (ref 150–400)
Platelets: 136 10*3/uL — ABNORMAL LOW (ref 150–400)
Platelets: 159 10*3/uL (ref 150–400)
RBC: 3.03 MIL/uL — ABNORMAL LOW (ref 3.87–5.11)
RBC: 3.17 MIL/uL — ABNORMAL LOW (ref 3.87–5.11)
RBC: 3.24 MIL/uL — ABNORMAL LOW (ref 3.87–5.11)
RBC: 3.28 MIL/uL — ABNORMAL LOW (ref 3.87–5.11)
RBC: 3.96 MIL/uL (ref 3.87–5.11)
RDW: 15.5 % (ref 11.5–15.5)
RDW: 19.4 % — ABNORMAL HIGH (ref 11.5–15.5)
RDW: 19.5 % — ABNORMAL HIGH (ref 11.5–15.5)
RDW: 19.5 % — ABNORMAL HIGH (ref 11.5–15.5)
RDW: 19.6 % — ABNORMAL HIGH (ref 11.5–15.5)
RDW: 22.5 % — ABNORMAL HIGH (ref 11.5–15.5)
WBC: 5.7 10*3/uL (ref 4.0–10.5)
WBC: 7.1 10*3/uL (ref 4.0–10.5)
WBC: 7.5 10*3/uL (ref 4.0–10.5)
WBC: 8.5 10*3/uL (ref 4.0–10.5)
WBC: 8.7 10*3/uL (ref 4.0–10.5)
WBC: 8.9 10*3/uL (ref 4.0–10.5)
WBC: 9.5 10*3/uL (ref 4.0–10.5)

## 2010-05-13 LAB — TYPE AND SCREEN
Unit division: 0
Unit division: 0

## 2010-05-13 LAB — BASIC METABOLIC PANEL
BUN: 15 mg/dL (ref 6–23)
BUN: 17 mg/dL (ref 6–23)
CO2: 27 mEq/L (ref 19–32)
CO2: 29 mEq/L (ref 19–32)
Calcium: 8.4 mg/dL (ref 8.4–10.5)
Calcium: 9.1 mg/dL (ref 8.4–10.5)
Chloride: 104 mEq/L (ref 96–112)
Creatinine, Ser: 1.05 mg/dL (ref 0.4–1.2)
Creatinine, Ser: 2.05 mg/dL — ABNORMAL HIGH (ref 0.4–1.2)
GFR calc Af Amer: 28 mL/min — ABNORMAL LOW (ref >60)
GFR calc non Af Amer: 42 mL/min — ABNORMAL LOW (ref >60)
GFR calc non Af Amer: 51 mL/min — ABNORMAL LOW (ref >60)
Glucose, Bld: 102 mg/dL — ABNORMAL HIGH (ref 70–99)
Glucose, Bld: 118 mg/dL — ABNORMAL HIGH (ref 70–99)
Glucose, Bld: 89 mg/dL (ref 70–99)
Potassium: 4.6 mEq/L (ref 3.5–5.1)
Sodium: 141 mEq/L (ref 135–145)

## 2010-05-13 LAB — COMPREHENSIVE METABOLIC PANEL
AST: 22 U/L (ref 0–37)
Albumin: 2.5 g/dL — ABNORMAL LOW (ref 3.5–5.2)
Albumin: 2.9 g/dL — ABNORMAL LOW (ref 3.5–5.2)
Alkaline Phosphatase: 71 U/L (ref 39–117)
BUN: 19 mg/dL (ref 6–23)
Calcium: 8.4 mg/dL (ref 8.4–10.5)
Chloride: 109 mEq/L (ref 96–112)
Creatinine, Ser: 1.32 mg/dL — ABNORMAL HIGH (ref 0.4–1.2)
Creatinine, Ser: 1.33 mg/dL — ABNORMAL HIGH (ref 0.4–1.2)
GFR calc Af Amer: 47 mL/min — ABNORMAL LOW (ref >60)
Glucose, Bld: 75 mg/dL (ref 70–99)
Potassium: 3.5 mEq/L (ref 3.5–5.1)
Total Bilirubin: 1.7 mg/dL — ABNORMAL HIGH (ref 0.3–1.2)
Total Protein: 5.5 g/dL — ABNORMAL LOW (ref 6.0–8.3)

## 2010-05-13 LAB — DIFFERENTIAL
Basophils Relative: 0 % (ref 0–1)
Eosinophils Absolute: 0.2 10*3/uL (ref 0.0–0.7)
Eosinophils Relative: 2 % (ref 0–5)
Lymphs Abs: 3.8 10*3/uL (ref 0.7–4.0)
Monocytes Absolute: 1 10*3/uL (ref 0.1–1.0)
Monocytes Relative: 9 % (ref 3–12)

## 2010-05-13 LAB — PHOSPHORUS: Phosphorus: 2.5 mg/dL (ref 2.3–4.6)

## 2010-05-13 LAB — PREPARE FRESH FROZEN PLASMA
Unit division: 0
Unit division: 0
Unit division: 0
Unit division: 0

## 2010-05-13 LAB — CARDIAC PANEL(CRET KIN+CKTOT+MB+TROPI)
CK, MB: 0.4 ng/mL (ref 0.3–4.0)
CK, MB: 0.9 ng/mL (ref 0.3–4.0)
Relative Index: INVALID (ref 0.0–2.5)
Total CK: 16 U/L (ref 7–177)
Total CK: 40 U/L (ref 7–177)

## 2010-05-13 LAB — APTT: aPTT: 55 seconds — ABNORMAL HIGH (ref 24–37)

## 2010-05-14 LAB — POCT I-STAT, CHEM 8
Calcium, Ion: 1.06 mmol/L — ABNORMAL LOW (ref 1.12–1.32)
Chloride: 103 mEq/L (ref 96–112)
Glucose, Bld: 97 mg/dL (ref 70–99)
HCT: 19 % — ABNORMAL LOW (ref 36.0–46.0)
Hemoglobin: 6.5 g/dL — CL (ref 12.0–15.0)
Potassium: 2.8 mEq/L — ABNORMAL LOW (ref 3.5–5.1)

## 2010-05-14 LAB — CBC
HCT: 24 % — ABNORMAL LOW (ref 36.0–46.0)
HCT: 24.4 % — ABNORMAL LOW (ref 36.0–46.0)
HCT: 28 % — ABNORMAL LOW (ref 36.0–46.0)
HCT: 33.4 % — ABNORMAL LOW (ref 36.0–46.0)
HCT: 34.4 % — ABNORMAL LOW (ref 36.0–46.0)
HCT: 35.6 % — ABNORMAL LOW (ref 36.0–46.0)
HCT: 36.1 % (ref 36.0–46.0)
Hemoglobin: 11.2 g/dL — ABNORMAL LOW (ref 12.0–15.0)
Hemoglobin: 11.7 g/dL — ABNORMAL LOW (ref 12.0–15.0)
Hemoglobin: 7.5 g/dL — ABNORMAL LOW (ref 12.0–15.0)
Hemoglobin: 7.5 g/dL — ABNORMAL LOW (ref 12.0–15.0)
Hemoglobin: 9.1 g/dL — ABNORMAL LOW (ref 12.0–15.0)
MCH: 27.9 pg (ref 26.0–34.0)
MCH: 28 pg (ref 26.0–34.0)
MCH: 28.1 pg (ref 26.0–34.0)
MCH: 28.1 pg (ref 26.0–34.0)
MCH: 28.2 pg (ref 26.0–34.0)
MCHC: 30.7 g/dL (ref 30.0–36.0)
MCHC: 31.3 g/dL (ref 30.0–36.0)
MCHC: 31.5 g/dL (ref 30.0–36.0)
MCHC: 32.1 g/dL (ref 30.0–36.0)
MCHC: 32.4 g/dL (ref 30.0–36.0)
MCHC: 32.6 g/dL (ref 30.0–36.0)
MCV: 85.6 fL (ref 78.0–100.0)
MCV: 86.9 fL (ref 78.0–100.0)
MCV: 87.5 fL (ref 78.0–100.0)
MCV: 87.6 fL (ref 78.0–100.0)
MCV: 88.6 fL (ref 78.0–100.0)
MCV: 90.1 fL (ref 78.0–100.0)
Platelets: 111 10*3/uL — ABNORMAL LOW (ref 150–400)
Platelets: 114 10*3/uL — ABNORMAL LOW (ref 150–400)
Platelets: 149 10*3/uL — ABNORMAL LOW (ref 150–400)
Platelets: 154 10*3/uL (ref 150–400)
RBC: 3.24 MIL/uL — ABNORMAL LOW (ref 3.87–5.11)
RBC: 3.27 MIL/uL — ABNORMAL LOW (ref 3.87–5.11)
RBC: 3.9 MIL/uL (ref 3.87–5.11)
RBC: 4.02 MIL/uL (ref 3.87–5.11)
RDW: 15.3 % (ref 11.5–15.5)
RDW: 16.4 % — ABNORMAL HIGH (ref 11.5–15.5)
RDW: 16.7 % — ABNORMAL HIGH (ref 11.5–15.5)
RDW: 16.7 % — ABNORMAL HIGH (ref 11.5–15.5)
WBC: 6.1 10*3/uL (ref 4.0–10.5)
WBC: 6.5 10*3/uL (ref 4.0–10.5)
WBC: 6.5 10*3/uL (ref 4.0–10.5)
WBC: 7.8 10*3/uL (ref 4.0–10.5)

## 2010-05-14 LAB — PREPARE FRESH FROZEN PLASMA
Unit division: 0
Unit division: 0

## 2010-05-14 LAB — TYPE AND SCREEN
ABO/RH(D): A POS
Antibody Screen: NEGATIVE
Unit division: 0

## 2010-05-14 LAB — CROSSMATCH: Antibody Screen: NEGATIVE

## 2010-05-14 LAB — BASIC METABOLIC PANEL
BUN: 10 mg/dL (ref 6–23)
BUN: 11 mg/dL (ref 6–23)
BUN: 30 mg/dL — ABNORMAL HIGH (ref 6–23)
BUN: 7 mg/dL (ref 6–23)
CO2: 31 mEq/L (ref 19–32)
CO2: 32 mEq/L (ref 19–32)
CO2: 38 mEq/L — ABNORMAL HIGH (ref 19–32)
Calcium: 7.8 mg/dL — ABNORMAL LOW (ref 8.4–10.5)
Calcium: 8 mg/dL — ABNORMAL LOW (ref 8.4–10.5)
Chloride: 105 mEq/L (ref 96–112)
Chloride: 106 mEq/L (ref 96–112)
Chloride: 107 mEq/L (ref 96–112)
Creatinine, Ser: 1.2 mg/dL (ref 0.4–1.2)
GFR calc Af Amer: 60 mL/min — ABNORMAL LOW (ref >60)
GFR calc Af Amer: >60 mL/min (ref >60)
GFR calc non Af Amer: 44 mL/min — ABNORMAL LOW (ref >60)
GFR calc non Af Amer: 50 mL/min — ABNORMAL LOW (ref >60)
GFR calc non Af Amer: 51 mL/min — ABNORMAL LOW (ref >60)
Glucose, Bld: 112 mg/dL — ABNORMAL HIGH (ref 70–99)
Glucose, Bld: 82 mg/dL (ref 70–99)
Glucose, Bld: 95 mg/dL (ref 70–99)
Potassium: 2.9 mEq/L — ABNORMAL LOW (ref 3.5–5.1)
Potassium: 3.1 mEq/L — ABNORMAL LOW (ref 3.5–5.1)
Potassium: 3.6 mEq/L (ref 3.5–5.1)
Potassium: 4.5 mEq/L (ref 3.5–5.1)
Sodium: 143 mEq/L (ref 135–145)
Sodium: 144 mEq/L (ref 135–145)
Sodium: 145 mEq/L (ref 135–145)
Sodium: 151 mEq/L — ABNORMAL HIGH (ref 135–145)

## 2010-05-14 LAB — COMPREHENSIVE METABOLIC PANEL
Albumin: 2.8 g/dL — ABNORMAL LOW (ref 3.5–5.2)
BUN: 22 mg/dL (ref 6–23)
Calcium: 8.5 mg/dL (ref 8.4–10.5)
Chloride: 104 mEq/L (ref 96–112)
Creatinine, Ser: 1.14 mg/dL (ref 0.4–1.2)
GFR calc Af Amer: 56 mL/min — ABNORMAL LOW (ref >60)
GFR calc non Af Amer: 46 mL/min — ABNORMAL LOW (ref >60)
Total Bilirubin: 1 mg/dL (ref 0.3–1.2)

## 2010-05-14 LAB — URINE MICROSCOPIC-ADD ON

## 2010-05-14 LAB — GLUCOSE, CAPILLARY: Glucose-Capillary: 90 mg/dL (ref 70–99)

## 2010-05-14 LAB — URINE CULTURE
Colony Count: 35000
Culture  Setup Time: 201111201130

## 2010-05-14 LAB — MAGNESIUM: Magnesium: 1.9 mg/dL (ref 1.5–2.5)

## 2010-05-14 LAB — PROTIME-INR
INR: 1.17 (ref 0.00–1.49)
INR: 1.41 (ref 0.00–1.49)

## 2010-05-14 LAB — HEPATIC FUNCTION PANEL
ALT: 15 U/L (ref 0–35)
Alkaline Phosphatase: 85 U/L (ref 39–117)
Bilirubin, Direct: <0.1 mg/dL (ref 0.0–0.3)
Total Bilirubin: 0.2 mg/dL — ABNORMAL LOW (ref 0.3–1.2)

## 2010-05-14 LAB — URINALYSIS, ROUTINE W REFLEX MICROSCOPIC
Bilirubin Urine: NEGATIVE
Bilirubin Urine: NEGATIVE
Glucose, UA: NEGATIVE mg/dL
Ketones, ur: NEGATIVE mg/dL
Ketones, ur: NEGATIVE mg/dL
Nitrite: NEGATIVE
Protein, ur: 30 mg/dL — AB
Protein, ur: 30 mg/dL — AB
Specific Gravity, Urine: 1.015 (ref 1.005–1.030)
Urobilinogen, UA: 0.2 mg/dL (ref 0.0–1.0)
Urobilinogen, UA: 1 mg/dL (ref 0.0–1.0)

## 2010-05-14 LAB — DIFFERENTIAL
Basophils Absolute: 0 10*3/uL (ref 0.0–0.1)
Basophils Relative: 0 % (ref 0–1)
Eosinophils Absolute: 0.1 10*3/uL (ref 0.0–0.7)
Monocytes Absolute: 0.9 10*3/uL (ref 0.1–1.0)
Monocytes Relative: 9 % (ref 3–12)
Neutrophils Relative %: 56 % (ref 43–77)

## 2010-05-14 LAB — LIPID PANEL
Cholesterol: 183 mg/dL (ref 0–200)
Triglycerides: 139 mg/dL (ref <150)

## 2010-05-14 LAB — IRON: Iron: 177 ug/dL — ABNORMAL HIGH (ref 42–135)

## 2010-05-14 LAB — APTT: aPTT: 35 seconds (ref 24–37)

## 2010-05-15 LAB — CBC
HCT: 27.1 % — ABNORMAL LOW (ref 36.0–46.0)
HCT: 27.9 % — ABNORMAL LOW (ref 36.0–46.0)
HCT: 28.1 % — ABNORMAL LOW (ref 36.0–46.0)
HCT: 28.2 % — ABNORMAL LOW (ref 36.0–46.0)
HCT: 28.4 % — ABNORMAL LOW (ref 36.0–46.0)
Hemoglobin: 8.7 g/dL — ABNORMAL LOW (ref 12.0–15.0)
Hemoglobin: 8.8 g/dL — ABNORMAL LOW (ref 12.0–15.0)
MCH: 29.2 pg (ref 26.0–34.0)
MCH: 29.5 pg (ref 26.0–34.0)
MCHC: 31.9 g/dL (ref 30.0–36.0)
MCHC: 32.1 g/dL (ref 30.0–36.0)
MCHC: 32.4 g/dL (ref 30.0–36.0)
MCHC: 33.1 g/dL (ref 30.0–36.0)
MCV: 89.2 fL (ref 78.0–100.0)
MCV: 89.9 fL (ref 78.0–100.0)
MCV: 90.9 fL (ref 78.0–100.0)
MCV: 91 fL (ref 78.0–100.0)
MCV: 92.2 fL (ref 78.0–100.0)
MCV: 93.3 fL (ref 78.0–100.0)
Platelets: 209 10*3/uL (ref 150–400)
Platelets: 217 10*3/uL (ref 150–400)
Platelets: 287 10*3/uL (ref 150–400)
Platelets: 292 10*3/uL (ref 150–400)
Platelets: 328 10*3/uL (ref 150–400)
RBC: 2.82 MIL/uL — ABNORMAL LOW (ref 3.87–5.11)
RBC: 2.94 MIL/uL — ABNORMAL LOW (ref 3.87–5.11)
RBC: 2.99 MIL/uL — ABNORMAL LOW (ref 3.87–5.11)
RBC: 3.16 MIL/uL — ABNORMAL LOW (ref 3.87–5.11)
RDW: 15.2 % (ref 11.5–15.5)
RDW: 15.2 % (ref 11.5–15.5)
RDW: 15.3 % (ref 11.5–15.5)
RDW: 15.4 % (ref 11.5–15.5)
RDW: 15.4 % (ref 11.5–15.5)
RDW: 15.6 % — ABNORMAL HIGH (ref 11.5–15.5)
WBC: 10.7 10*3/uL — ABNORMAL HIGH (ref 4.0–10.5)
WBC: 11 10*3/uL — ABNORMAL HIGH (ref 4.0–10.5)
WBC: 13.3 10*3/uL — ABNORMAL HIGH (ref 4.0–10.5)
WBC: 13.8 10*3/uL — ABNORMAL HIGH (ref 4.0–10.5)
WBC: 17.2 10*3/uL — ABNORMAL HIGH (ref 4.0–10.5)

## 2010-05-15 LAB — DIFFERENTIAL
Basophils Relative: 0 % (ref 0–1)
Lymphocytes Relative: 7 % — ABNORMAL LOW (ref 12–46)
Monocytes Relative: 7 % (ref 3–12)
Neutro Abs: 14.3 10*3/uL — ABNORMAL HIGH (ref 1.7–7.7)
Neutrophils Relative %: 85 % — ABNORMAL HIGH (ref 43–77)

## 2010-05-15 LAB — BASIC METABOLIC PANEL
BUN: 13 mg/dL (ref 6–23)
BUN: 16 mg/dL (ref 6–23)
BUN: 17 mg/dL (ref 6–23)
BUN: 19 mg/dL (ref 6–23)
BUN: 22 mg/dL (ref 6–23)
CO2: 35 mEq/L — ABNORMAL HIGH (ref 19–32)
Chloride: 107 mEq/L (ref 96–112)
Chloride: 108 mEq/L (ref 96–112)
Chloride: 99 mEq/L (ref 96–112)
Creatinine, Ser: 1.36 mg/dL — ABNORMAL HIGH (ref 0.4–1.2)
Creatinine, Ser: 1.56 mg/dL — ABNORMAL HIGH (ref 0.4–1.2)
GFR calc Af Amer: 46 mL/min — ABNORMAL LOW (ref >60)
GFR calc non Af Amer: 32 mL/min — ABNORMAL LOW (ref >60)
GFR calc non Af Amer: 34 mL/min — ABNORMAL LOW (ref >60)
GFR calc non Af Amer: 38 mL/min — ABNORMAL LOW (ref >60)
GFR calc non Af Amer: 43 mL/min — ABNORMAL LOW (ref >60)
Glucose, Bld: 83 mg/dL (ref 70–99)
Glucose, Bld: 87 mg/dL (ref 70–99)
Glucose, Bld: 88 mg/dL (ref 70–99)
Potassium: 3.5 mEq/L (ref 3.5–5.1)
Potassium: 4.2 mEq/L (ref 3.5–5.1)
Potassium: 4.8 mEq/L (ref 3.5–5.1)
Potassium: 4.9 mEq/L (ref 3.5–5.1)
Sodium: 140 mEq/L (ref 135–145)

## 2010-05-15 LAB — URINALYSIS, ROUTINE W REFLEX MICROSCOPIC
Bilirubin Urine: NEGATIVE
Ketones, ur: NEGATIVE mg/dL
Nitrite: POSITIVE — AB
Protein, ur: NEGATIVE mg/dL
Urobilinogen, UA: 1 mg/dL (ref 0.0–1.0)

## 2010-05-15 LAB — ANAEROBIC CULTURE

## 2010-05-15 LAB — CULTURE, BLOOD (ROUTINE X 2)
Culture  Setup Time: 201110190143
Culture: NO GROWTH

## 2010-05-15 LAB — WOUND CULTURE

## 2010-05-15 LAB — GLUCOSE, CAPILLARY: Glucose-Capillary: 100 mg/dL — ABNORMAL HIGH (ref 70–99)

## 2010-05-15 LAB — COMPREHENSIVE METABOLIC PANEL
Alkaline Phosphatase: 143 U/L — ABNORMAL HIGH (ref 39–117)
BUN: 18 mg/dL (ref 6–23)
Calcium: 7.5 mg/dL — ABNORMAL LOW (ref 8.4–10.5)
Creatinine, Ser: 1.07 mg/dL (ref 0.4–1.2)
Glucose, Bld: 81 mg/dL (ref 70–99)
Potassium: 3.7 mEq/L (ref 3.5–5.1)
Total Protein: 4.4 g/dL — ABNORMAL LOW (ref 6.0–8.3)

## 2010-05-15 LAB — URINE CULTURE: Colony Count: 15000

## 2010-05-16 LAB — COMPREHENSIVE METABOLIC PANEL
ALT: 20 U/L (ref 0–35)
ALT: 53 U/L — ABNORMAL HIGH (ref 0–35)
ALT: 88 U/L — ABNORMAL HIGH (ref 0–35)
AST: 22 U/L (ref 0–37)
AST: 87 U/L — ABNORMAL HIGH (ref 0–37)
Albumin: 1.7 g/dL — ABNORMAL LOW (ref 3.5–5.2)
Albumin: 3.4 g/dL — ABNORMAL LOW (ref 3.5–5.2)
Alkaline Phosphatase: 120 U/L — ABNORMAL HIGH (ref 39–117)
Alkaline Phosphatase: 99 U/L (ref 39–117)
BUN: 17 mg/dL (ref 6–23)
BUN: 19 mg/dL (ref 6–23)
CO2: 23 mEq/L (ref 19–32)
CO2: 23 mEq/L (ref 19–32)
CO2: 32 mEq/L (ref 19–32)
Calcium: 6.9 mg/dL — ABNORMAL LOW (ref 8.4–10.5)
Calcium: 9.1 mg/dL (ref 8.4–10.5)
Chloride: 103 mEq/L (ref 96–112)
Chloride: 105 mEq/L (ref 96–112)
Chloride: 113 mEq/L — ABNORMAL HIGH (ref 96–112)
Creatinine, Ser: 1.12 mg/dL (ref 0.4–1.2)
Creatinine, Ser: 1.29 mg/dL — ABNORMAL HIGH (ref 0.4–1.2)
GFR calc Af Amer: 49 mL/min — ABNORMAL LOW (ref >60)
GFR calc Af Amer: 57 mL/min — ABNORMAL LOW (ref >60)
GFR calc non Af Amer: 40 mL/min — ABNORMAL LOW (ref >60)
GFR calc non Af Amer: 47 mL/min — ABNORMAL LOW (ref >60)
GFR calc non Af Amer: 51 mL/min — ABNORMAL LOW (ref >60)
Glucose, Bld: 81 mg/dL (ref 70–99)
Glucose, Bld: 99 mg/dL (ref 70–99)
Potassium: 3 mEq/L — ABNORMAL LOW (ref 3.5–5.1)
Potassium: 4.6 mEq/L (ref 3.5–5.1)
Sodium: 137 mEq/L (ref 135–145)
Sodium: 138 mEq/L (ref 135–145)
Total Bilirubin: 1.3 mg/dL — ABNORMAL HIGH (ref 0.3–1.2)
Total Bilirubin: 1.3 mg/dL — ABNORMAL HIGH (ref 0.3–1.2)
Total Bilirubin: 1.8 mg/dL — ABNORMAL HIGH (ref 0.3–1.2)
Total Protein: 6.6 g/dL (ref 6.0–8.3)
Total Protein: 6.7 g/dL (ref 6.0–8.3)

## 2010-05-16 LAB — BASIC METABOLIC PANEL
BUN: 16 mg/dL (ref 6–23)
BUN: 18 mg/dL (ref 6–23)
BUN: 18 mg/dL (ref 6–23)
BUN: 19 mg/dL (ref 6–23)
BUN: 21 mg/dL (ref 6–23)
BUN: 22 mg/dL (ref 6–23)
CO2: 20 mEq/L (ref 19–32)
CO2: 26 mEq/L (ref 19–32)
CO2: 29 mEq/L (ref 19–32)
Calcium: 6.6 mg/dL — ABNORMAL LOW (ref 8.4–10.5)
Calcium: 7 mg/dL — ABNORMAL LOW (ref 8.4–10.5)
Calcium: 7.3 mg/dL — ABNORMAL LOW (ref 8.4–10.5)
Calcium: 8.7 mg/dL (ref 8.4–10.5)
Chloride: 105 mEq/L (ref 96–112)
Chloride: 109 mEq/L (ref 96–112)
Chloride: 110 mEq/L (ref 96–112)
Chloride: 117 mEq/L — ABNORMAL HIGH (ref 96–112)
Chloride: 119 mEq/L — ABNORMAL HIGH (ref 96–112)
Creatinine, Ser: 1.14 mg/dL (ref 0.4–1.2)
Creatinine, Ser: 1.19 mg/dL (ref 0.4–1.2)
Creatinine, Ser: 1.35 mg/dL — ABNORMAL HIGH (ref 0.4–1.2)
GFR calc Af Amer: 53 mL/min — ABNORMAL LOW (ref >60)
GFR calc Af Amer: 58 mL/min — ABNORMAL LOW (ref >60)
GFR calc non Af Amer: 44 mL/min — ABNORMAL LOW (ref >60)
GFR calc non Af Amer: 46 mL/min — ABNORMAL LOW (ref >60)
GFR calc non Af Amer: 49 mL/min — ABNORMAL LOW (ref >60)
GFR calc non Af Amer: 54 mL/min — ABNORMAL LOW (ref >60)
GFR calc non Af Amer: >60 mL/min (ref >60)
Glucose, Bld: 106 mg/dL — ABNORMAL HIGH (ref 70–99)
Glucose, Bld: 87 mg/dL (ref 70–99)
Glucose, Bld: 88 mg/dL (ref 70–99)
Glucose, Bld: 89 mg/dL (ref 70–99)
Glucose, Bld: 92 mg/dL (ref 70–99)
Glucose, Bld: 92 mg/dL (ref 70–99)
Glucose, Bld: 98 mg/dL (ref 70–99)
Potassium: 3.4 mEq/L — ABNORMAL LOW (ref 3.5–5.1)
Potassium: 3.5 mEq/L (ref 3.5–5.1)
Potassium: 3.6 mEq/L (ref 3.5–5.1)
Potassium: 3.7 mEq/L (ref 3.5–5.1)
Potassium: 3.9 mEq/L (ref 3.5–5.1)
Sodium: 141 mEq/L (ref 135–145)
Sodium: 141 mEq/L (ref 135–145)
Sodium: 143 mEq/L (ref 135–145)

## 2010-05-16 LAB — CBC
HCT: 24 % — ABNORMAL LOW (ref 36.0–46.0)
HCT: 24.8 % — ABNORMAL LOW (ref 36.0–46.0)
HCT: 26.5 % — ABNORMAL LOW (ref 36.0–46.0)
HCT: 29.2 % — ABNORMAL LOW (ref 36.0–46.0)
HCT: 30.3 % — ABNORMAL LOW (ref 36.0–46.0)
HCT: 42.1 % (ref 36.0–46.0)
HCT: 42.3 % (ref 36.0–46.0)
HCT: 44.7 % (ref 36.0–46.0)
Hemoglobin: 10.3 g/dL — ABNORMAL LOW (ref 12.0–15.0)
Hemoglobin: 14.6 g/dL (ref 12.0–15.0)
Hemoglobin: 8 g/dL — ABNORMAL LOW (ref 12.0–15.0)
Hemoglobin: 8.4 g/dL — ABNORMAL LOW (ref 12.0–15.0)
Hemoglobin: 9.6 g/dL — ABNORMAL LOW (ref 12.0–15.0)
Hemoglobin: 9.9 g/dL — ABNORMAL LOW (ref 12.0–15.0)
MCH: 29.2 pg (ref 26.0–34.0)
MCH: 29.2 pg (ref 26.0–34.0)
MCH: 29.3 pg (ref 26.0–34.0)
MCH: 29.4 pg (ref 26.0–34.0)
MCH: 29.7 pg (ref 26.0–34.0)
MCH: 29.9 pg (ref 26.0–34.0)
MCHC: 32.7 g/dL (ref 30.0–36.0)
MCHC: 32.8 g/dL (ref 30.0–36.0)
MCHC: 33 g/dL (ref 30.0–36.0)
MCHC: 33.2 g/dL (ref 30.0–36.0)
MCHC: 33.9 g/dL (ref 30.0–36.0)
MCV: 87.8 fL (ref 78.0–100.0)
MCV: 87.9 fL (ref 78.0–100.0)
MCV: 88.1 fL (ref 78.0–100.0)
MCV: 88.7 fL (ref 78.0–100.0)
MCV: 89.4 fL (ref 78.0–100.0)
MCV: 89.8 fL (ref 78.0–100.0)
MCV: 89.8 fL (ref 78.0–100.0)
MCV: 91.8 fL (ref 78.0–100.0)
Platelets: 108 10*3/uL — ABNORMAL LOW (ref 150–400)
Platelets: 132 10*3/uL — ABNORMAL LOW (ref 150–400)
Platelets: 141 10*3/uL — ABNORMAL LOW (ref 150–400)
Platelets: 81 10*3/uL — ABNORMAL LOW (ref 150–400)
Platelets: 91 10*3/uL — ABNORMAL LOW (ref 150–400)
RBC: 2.74 MIL/uL — ABNORMAL LOW (ref 3.87–5.11)
RBC: 3.23 MIL/uL — ABNORMAL LOW (ref 3.87–5.11)
RBC: 3.26 MIL/uL — ABNORMAL LOW (ref 3.87–5.11)
RBC: 3.39 MIL/uL — ABNORMAL LOW (ref 3.87–5.11)
RBC: 3.47 MIL/uL — ABNORMAL LOW (ref 3.87–5.11)
RBC: 4.71 MIL/uL (ref 3.87–5.11)
RDW: 13.8 % (ref 11.5–15.5)
RDW: 13.9 % (ref 11.5–15.5)
RDW: 13.9 % (ref 11.5–15.5)
RDW: 14 % (ref 11.5–15.5)
RDW: 14.2 % (ref 11.5–15.5)
RDW: 14.6 % (ref 11.5–15.5)
RDW: 14.9 % (ref 11.5–15.5)
WBC: 10.5 10*3/uL (ref 4.0–10.5)
WBC: 11.7 10*3/uL — ABNORMAL HIGH (ref 4.0–10.5)
WBC: 19.4 10*3/uL — ABNORMAL HIGH (ref 4.0–10.5)
WBC: 9 10*3/uL (ref 4.0–10.5)

## 2010-05-16 LAB — URINE CULTURE
Colony Count: >=100000
Colony Count: NO GROWTH
Culture  Setup Time: 201109292150
Culture  Setup Time: 201110101027

## 2010-05-16 LAB — MAGNESIUM: Magnesium: 1.5 mg/dL (ref 1.5–2.5)

## 2010-05-16 LAB — URINALYSIS, ROUTINE W REFLEX MICROSCOPIC
Glucose, UA: NEGATIVE mg/dL
Glucose, UA: NEGATIVE mg/dL
Hgb urine dipstick: NEGATIVE
Hgb urine dipstick: NEGATIVE
Hgb urine dipstick: NEGATIVE
Ketones, ur: NEGATIVE mg/dL
Protein, ur: NEGATIVE mg/dL
Specific Gravity, Urine: 1.008 (ref 1.005–1.030)
Specific Gravity, Urine: 1.028 (ref 1.005–1.030)
Urobilinogen, UA: 0.2 mg/dL (ref 0.0–1.0)
pH: 5 (ref 5.0–8.0)

## 2010-05-16 LAB — BLOOD GAS, ARTERIAL
Acid-Base Excess: 0.1 mmol/L (ref 0.0–2.0)
Acid-Base Excess: 0.3 mmol/L (ref 0.0–2.0)
Bicarbonate: 22.9 mEq/L (ref 20.0–24.0)
Bicarbonate: 23.7 mEq/L (ref 20.0–24.0)
O2 Saturation: 96.6 %
O2 Saturation: 99.1 %
PEEP: 5 cmH2O
Patient temperature: 98.6
Patient temperature: 98.6
RATE: 6 resp/min
TCO2: 24.8 mmol/L (ref 0–100)
TCO2: 25.6 mmol/L (ref 0–100)
pCO2 arterial: 35.5 mmHg (ref 35.0–45.0)
pH, Arterial: 7.316 — ABNORMAL LOW (ref 7.350–7.400)
pH, Arterial: 7.409 — ABNORMAL HIGH (ref 7.350–7.400)
pH, Arterial: 7.44 — ABNORMAL HIGH (ref 7.350–7.400)
pO2, Arterial: 105 mmHg — ABNORMAL HIGH (ref 80.0–100.0)

## 2010-05-16 LAB — POCT I-STAT 7, (LYTES, BLD GAS, ICA,H+H)
Acid-base deficit: 1 mmol/L (ref 0.0–2.0)
Bicarbonate: 24.8 mEq/L — ABNORMAL HIGH (ref 20.0–24.0)
Calcium, Ion: 1.09 mmol/L — ABNORMAL LOW (ref 1.12–1.32)
HCT: 24 % — ABNORMAL LOW (ref 36.0–46.0)
Hemoglobin: 8.2 g/dL — ABNORMAL LOW (ref 12.0–15.0)
Patient temperature: 35.5
Potassium: 4.4 mEq/L (ref 3.5–5.1)
TCO2: 26 mmol/L (ref 0–100)
pCO2 arterial: 39.2 mmHg (ref 35.0–45.0)
pCO2 arterial: 47.7 mmHg — ABNORMAL HIGH (ref 35.0–45.0)
pH, Arterial: 7.403 — ABNORMAL HIGH (ref 7.350–7.400)
pO2, Arterial: 556 mmHg — ABNORMAL HIGH (ref 80.0–100.0)

## 2010-05-16 LAB — CARDIAC PANEL(CRET KIN+CKTOT+MB+TROPI)
CK, MB: 2.2 ng/mL (ref 0.3–4.0)
CK, MB: 2.8 ng/mL (ref 0.3–4.0)
Relative Index: 1.9 (ref 0.0–2.5)
Total CK: 101 U/L (ref 7–177)
Total CK: 151 U/L (ref 7–177)
Total CK: 77 U/L (ref 7–177)

## 2010-05-16 LAB — TYPE AND SCREEN
Antibody Screen: NEGATIVE
Antibody Screen: NEGATIVE

## 2010-05-16 LAB — POCT I-STAT 3, ART BLOOD GAS (G3+)
Acid-base deficit: 5 mmol/L — ABNORMAL HIGH (ref 0.0–2.0)
Acid-base deficit: 9 mmol/L — ABNORMAL HIGH (ref 0.0–2.0)
O2 Saturation: 92 %
O2 Saturation: 94 %
O2 Saturation: 97 %
TCO2: 23 mmol/L (ref 0–100)
pCO2 arterial: 35.3 mmHg (ref 35.0–45.0)
pCO2 arterial: 45 mmHg (ref 35.0–45.0)
pH, Arterial: 7.33 — ABNORMAL LOW (ref 7.350–7.400)
pO2, Arterial: 80 mmHg (ref 80.0–100.0)

## 2010-05-16 LAB — GLUCOSE, CAPILLARY
Glucose-Capillary: 76 mg/dL (ref 70–99)
Glucose-Capillary: 77 mg/dL (ref 70–99)
Glucose-Capillary: 83 mg/dL (ref 70–99)
Glucose-Capillary: 88 mg/dL (ref 70–99)
Glucose-Capillary: 97 mg/dL (ref 70–99)

## 2010-05-16 LAB — POTASSIUM: Potassium: 3.5 mEq/L (ref 3.5–5.1)

## 2010-05-16 LAB — AMYLASE: Amylase: 246 U/L — ABNORMAL HIGH (ref 0–105)

## 2010-05-16 LAB — MRSA PCR SCREENING: MRSA by PCR: NEGATIVE

## 2010-05-16 LAB — PROTIME-INR
INR: 0.94 (ref 0.00–1.49)
INR: 1.25 (ref 0.00–1.49)
Prothrombin Time: 12.5 seconds (ref 11.6–15.2)
Prothrombin Time: 12.8 seconds (ref 11.6–15.2)
Prothrombin Time: 15.9 seconds — ABNORMAL HIGH (ref 11.6–15.2)

## 2010-05-16 LAB — SURGICAL PCR SCREEN
MRSA, PCR: NEGATIVE
Staphylococcus aureus: NEGATIVE

## 2010-05-16 LAB — APTT: aPTT: 36 seconds (ref 24–37)

## 2010-05-16 LAB — HEPARIN INDUCED THROMBOCYTOPENIA PNL
UFH Low Dose 0.5 IU/mL: 0 % Release
UFH SRA Result: NEGATIVE

## 2010-05-16 LAB — URINE MICROSCOPIC-ADD ON

## 2010-05-16 LAB — CALCIUM, IONIZED: Calcium, Ion: 1.02 mmol/L — ABNORMAL LOW (ref 1.12–1.32)

## 2010-05-16 LAB — ABO/RH: ABO/RH(D): A POS

## 2010-05-21 ENCOUNTER — Ambulatory Visit
Admission: RE | Admit: 2010-05-21 | Discharge: 2010-05-21 | Disposition: A | Discharge status: Home / Self Care | Payer: Medicare Other | Source: Ambulatory Visit | Attending: Cardiology | Admitting: Cardiology

## 2010-05-21 DIAGNOSIS — Z1239 Encounter for other screening for malignant neoplasm of breast: Secondary | ICD-10-CM

## 2010-06-07 LAB — POCT I-STAT 3, ART BLOOD GAS (G3+)
Acid-Base Excess: 2 mmol/L (ref 0.0–2.0)
O2 Saturation: 92 %
TCO2: 29 mmol/L (ref 0–100)
pO2, Arterial: 67 mmHg — ABNORMAL LOW (ref 80.0–100.0)

## 2010-06-07 LAB — POCT I-STAT 3, VENOUS BLOOD GAS (G3P V)
O2 Saturation: 63 %
pCO2, Ven: 51.9 mmHg — ABNORMAL HIGH (ref 45.0–50.0)
pH, Ven: 7.345 — ABNORMAL HIGH (ref 7.250–7.300)

## 2010-06-18 ENCOUNTER — Ambulatory Visit (INDEPENDENT_AMBULATORY_CARE_PROVIDER_SITE_OTHER): Payer: Medicare Other | Admitting: Vascular Surgery

## 2010-06-18 DIAGNOSIS — S31109A Unspecified open wound of abdominal wall, unspecified quadrant without penetration into peritoneal cavity, initial encounter: Secondary | ICD-10-CM

## 2010-06-18 DIAGNOSIS — I70219 Atherosclerosis of native arteries of extremities with intermittent claudication, unspecified extremity: Secondary | ICD-10-CM

## 2010-06-19 ENCOUNTER — Other Ambulatory Visit: Payer: Self-pay | Admitting: Vascular Surgery

## 2010-06-19 DIAGNOSIS — L02214 Cutaneous abscess of groin: Secondary | ICD-10-CM

## 2010-06-19 NOTE — Assessment & Plan Note (Signed)
OFFICE VISIT  Brenda [name], Brenda [name] DOB:  November 08, 1931                                       06/18/2010 [id number]   This is a patient of Dr. Trula Slade who he has added on the schedule today because of some drainage of Brenda [name] left inguinal area which had occurred over the weekend.  The patient had an aortobifemoral graft inserted by Dr. Trula Slade in October last year and required re-exploration of the right inguinal area with insertion of a VAC by Dr. Scot Dock about a week later.  The patient has had a CT of the abdomen in the past, in November of last year, with a low-density collection extending around the graft on the left side and at the proximal anastomosis.  The patient denies any chills, fever, diarrhea, constipation or other specific problems.  PHYSICAL EXAMINATION:  Today, blood pressure is 158/63, heart rate 86, respirations 22.  Abdominal incision is well-healed with no evidence of ventral hernia.  Both inguinal areas appear well healed with 3+ femoral pulses.  The area where the drainage occurred today is sealed over with no evidence of any drainage and I cannot express any drainage.  There is no erythema or fluctuance.  I am uncertain what this represents but we will obtain a CT angiogram and CT of the abdomen to see if there is any fluid collection around the graft.  The patient will return next week to see Dr. Trula Slade for further follow-up.    Nelda Severe Kellie Simmering, M.D. Electronically Signed  JDL/MEDQ  D:  06/18/2010  T:  06/19/2010  Job:  [id number]

## 2010-06-20 ENCOUNTER — Ambulatory Visit (HOSPITAL_COMMUNITY)
Admission: RE | Admit: 2010-06-20 | Discharge: 2010-06-20 | Disposition: A | Discharge status: Home / Self Care | Payer: Medicare Other | Source: Ambulatory Visit | Attending: Vascular Surgery | Admitting: Vascular Surgery

## 2010-06-20 DIAGNOSIS — Z9089 Acquired absence of other organs: Secondary | ICD-10-CM | POA: Insufficient documentation

## 2010-06-20 DIAGNOSIS — M79609 Pain in unspecified limb: Secondary | ICD-10-CM | POA: Insufficient documentation

## 2010-06-20 DIAGNOSIS — E278 Other specified disorders of adrenal gland: Secondary | ICD-10-CM | POA: Insufficient documentation

## 2010-06-20 DIAGNOSIS — L02214 Cutaneous abscess of groin: Secondary | ICD-10-CM

## 2010-06-20 DIAGNOSIS — R110 Nausea: Secondary | ICD-10-CM | POA: Insufficient documentation

## 2010-06-20 DIAGNOSIS — K5730 Diverticulosis of large intestine without perforation or abscess without bleeding: Secondary | ICD-10-CM | POA: Insufficient documentation

## 2010-06-20 DIAGNOSIS — K5900 Constipation, unspecified: Secondary | ICD-10-CM | POA: Insufficient documentation

## 2010-06-20 LAB — BUN: BUN: 20 mg/dL (ref 6–23)

## 2010-06-20 LAB — CREATININE, SERUM: GFR calc non Af Amer: 32 mL/min — ABNORMAL LOW (ref >60)

## 2010-06-24 ENCOUNTER — Ambulatory Visit (INDEPENDENT_AMBULATORY_CARE_PROVIDER_SITE_OTHER): Payer: Medicare Other | Admitting: Surgery

## 2010-06-24 DIAGNOSIS — I7092 Chronic total occlusion of artery of the extremities: Secondary | ICD-10-CM

## 2010-06-24 DIAGNOSIS — S31109A Unspecified open wound of abdominal wall, unspecified quadrant without penetration into peritoneal cavity, initial encounter: Secondary | ICD-10-CM

## 2010-06-25 NOTE — Assessment & Plan Note (Signed)
OFFICE VISIT  Brenda, [name] [name] DOB:  Jul 05, 1931                                       06/24/2010 [id number]  The patient comes back today for followup.  She is status post aortobifemoral bypass graft on 12/07/2009.  She required groin reexploration by Dr. Scot [name] for lymphatic leak.  Her cultures grew out Pseudomonas.  She was seen by infectious disease and placed on antibiotic therapy.  She represented on 04/17 to Dr. Kellie [name] with drainage from her left groin.  Nothing could be expressed at that time. There was no erythema or fluctuance.  She was not having any fever.  She was sent for a CT scan and comes back today for followup.  She states that since this past Tuesday there has been no drainage.  She has not had any fevers, drainage or pain in that area.  PHYSICAL EXAMINATION:  Vital signs:  Heart rate 50, blood pressure 130/65, temperature is 97.6.  General:  She is well-appearing, in no distress.  Cardiovascular:  She has palpable pedal pulses and femoral pulses bilaterally.  Her groin incisions are healed.  There is no drainage.  There is no erythema.  DIAGNOSTIC STUDIES:  CT scan was reviewed today.  It was done without contrast due to elevated creatinine.  There is no evidence to suggest infection.  There were no fluid collections in the groin.  ASSESSMENT AND LAN:  Status post aortobifemoral bypass graft.  There is no evidence of infection by CT scan or clinically.  She most likely developed a small lymphocele which spontaneously drained and is now healed.  I did discuss with her that if this were to happen again to contact our office as she did before.  I would be concerned that this could lead to a graft infection which could be very problematic. However, at this time I think she is doing very well and I do not see any further intervention needed.  The patient has been complaining of some dizziness with her walking.  We have carotid  ultrasounds here that showed no significant carotid or vertebral artery disease bilaterally.  This does not sound like vertigo as she does not describe the room as spinning around but rather she just gets dizzy and feels like she is going to fall.  With her cardiac history I think that she needs to be reevaluated by Dr. Montez [name] or Dr. Doylene [name], whoever is covering for him when he is away.    Brenda Abrahams, MD Electronically Signed  VWB/MEDQ  D:  06/24/2010  [name]:  06/25/2010  Job:  [id number]  cc:   Brenda [name], M.D.

## 2010-07-16 NOTE — Assessment & Plan Note (Signed)
OFFICE VISIT   Brenda [name], Brenda [name]  DOB:  22-Feb-1932                                       12/31/2009  [id number]   REASON FOR VISIT:  Follow-up.   Patient is status post aortobifemoral bypass graft with a significant  right groin reconstruction which involved end-to-end graft at profunda  femoral artery with reimplantation of the superficial femoral artery on  the anterior medial side of the graft.  This was performed on  12/07/2009.   Her postoperative course was complicated by a late lymphatic leak in the  right groin which required reexploration by Dr. Scot Dock.  Cultures grew  out Pseudomonas.  The patient was seen by infectious disease and placed  on antibiotic therapy, which she is getting intravenously.  She has had  a slight separation of the distal aspect of her left groin incision,  approximately 1 mm.  There is no evidence of infection.  There is  minimal drainage.   Overall the patient is doing very well.  She does have some issues with  constipation, but her pain is better controlled.  She is ambulating  better.  I am very happy with the progress that she has been making, and  I have encouraged her to continue eating and even to try nutritional  supplements.  I have given her a prescription for Colace to help with  her constipation.  I will see her back in 3 weeks to evaluate her left  groin.  In the meantime, she is going to continue to paint her incisions  with Betadine, and she will call me if she has any problems.     Eldridge Abrahams, MD  Electronically Signed   VWB/MEDQ  D:  12/31/2009  [name]:  01/01/2010  Job:  [id number]

## 2010-07-16 NOTE — Procedures (Signed)
CAROTID DUPLEX EXAM   INDICATION:  Preoperative evaluation prior to aortofemoral bypass graft.   HISTORY:  Diabetes:  No.  Cardiac:  Arrhythmia.  Hypertension:  Yes.  Smoking:  No.  Previous Surgery:  No.  CV History:  No.  Amaurosis Fugax  No, Paresthesias  No, Hemiparesis  No.                                       RIGHT             LEFT  Brachial systolic pressure:         200               200  Brachial Doppler waveforms:         Triphasic         Triphasic  Vertebral direction of flow:        Antegrade         Antegrade  DUPLEX VELOCITIES (cm/sec)  CCA peak systolic                   79                70  ECA peak systolic                   38                50  ICA peak systolic                   65                68  ICA end diastolic                   17                14  PLAQUE MORPHOLOGY:                  Soft              Calcified  PLAQUE AMOUNT:                      Mild              Mild-moderate  PLAQUE LOCATION:                    Bifurcation       Bifurcation/ICA  proximal   IMPRESSION:  No evidence of hemodynamically significant internal carotid  artery stenosis bilaterally, <40%.         ___________________________________________  V. Leia Alf, MD   RS/MEDQ  D:  11/12/2009  T:  11/12/2009  Job:  [id number]

## 2010-07-16 NOTE — Procedures (Signed)
[name], [name]              ACCOUNT NO.:  [id number]   MEDICAL RECORD NO.:  [id number]          PATIENT TYPE:  AMB   LOCATION:  SDS                          FACILITY:  [facility]   PHYSICIAN:  Lauree Chandler, MDDATE OF BIRTH:  08-22-1931   DATE OF PROCEDURE:  10/10/2009  DATE OF DISCHARGE:                    PERIPHERAL VASCULAR INVASIVE PROCEDURE   PRIMARY CARDIOLOGIST:  Ardyth Gal. Spruill, M.D.   REFERRING PHYSICIAN:  Loralie Champagne, M.D.   PROCEDURE PERFORMED:  Distal aortogram with bilateral lower extremity  runoff.   OPERATOR:  Darlina Guys, M.D.   INDICATION:  This is a 77 year old Caucasian female with a history of  long-standing, ongoing tobacco abuse, as well as COPD, hypertension and  known peripheral vascular disease whom I saw in the office with  complaints of claudication.  She had undergone a diagnostic cardiac  catheterization by Dr. Loralie Champagne in September of 2010, and a distal  aortogram suggested high-grade disease in the right and left iliac  system.  Noninvasive testing in the office showed her ABI in the right  leg was 0.60 and in the left leg was 0.83.  The patient has pain in both  calves after walking 10-15 feet.  She also has complaints of cramping at  rest in both legs.  Diagnostic angiogram was arranged for today.   DETAILS OF PROCEDURE:  The patient was brought to the main peripheral  vascular laboratory after signing informed consent for the procedure.  The left groin was prepped and draped in a sterile fashion.  One percent  lidocaine was used for local anesthesia.  A 5-French sheath was inserted  into the left femoral artery without difficulty.  We ran a pigtail  catheter into the distal aorta just above the take-off of the bilateral  renal arteries.  We performed a distal aortogram and then pulled the  pigtail catheter back to the aortic bifurcation.  We then performed a  stationary pelvic angiogram and followed this with oblique  views of the  pelvic arteries.  We then performed a bilateral lower extremity run-off  to the feet.  The patient tolerated the procedure well and was taken to  the holding area in stable condition.   HEMODYNAMIC FINDINGS:  Central aortic pressure 158/77.   ANGIOGRAPHIC FINDINGS:  1. The distal aorta had diffuse plaque and 40% stenosis of the distal      portion before the bifurcation.  2. The right renal artery was patent with no obstructive disease.  3. The left renal artery was patent with a 50% stenosis in the mid      portion of the main renal artery.  4. The right common iliac artery had an ostial 70% stenosis and then      80% stenosis just past the ostium.  There was diffuse plaque      throughout the remainder of the right common iliac artery.  The      right external iliac artery had diffuse 80% stenosis and then was      totally occluded at its distal portion.  The right common femoral      artery was totally occluded.  The profunda femoral artery filled      from collaterals.  The right superficial femoral artery filled fromcollaterals.  The right superficial femoral artery had irregular      30% plaque in the proximal portion of the vessel and diffuse 40%      disease in the distal portion of the vessel.  The right popliteal      artery had a 50% discrete stenosis.  There was three vessel run-off      to the right foot.  5. The left common iliac artery had serial 50% lesions.  The left      external iliac artery had a 40% stenosis.  The left common femoral      artery had diffuse plaque but no obstructive lesions.  The left      superficial femoral artery had mild plaque in the proximal and mid      portions and serial 50% lesions in the distal portion.  The left      popliteal artery had a 50% discrete stenosis.  The left anterior      tibial artery arose from an unusually high take-off and had 99%      ostial stenosis followed by 100% proximal occlusion.  This vessel       was diffusely diseased throughout and reconstituted by collaterals      in the mid portion of the vessel.  The left posterior tibial artery      and left peroneal artery were patent.   IMPRESSION:  1. Severe peripheral vascular disease with occlusion of the right      external iliac artery and right common femoral artery with diffuse      throughout both superficial femoral arteries and popliteal      arteries.  2. Diffuse disease in the left anterior tibial artery with      reconstitution by collateral vessels.   RECOMMENDATIONS:  This patient does not have peripheral vascular disease  that is favorable for percutaneous intervention.  I will make a referral  to [facility] and have them evaluate her films for  consideration for a right common femoral endarterectomy versus femoral-  femoral bypass.      Lauree Chandler, MD     CM/MEDQ  D:  10/10/2009  T:  10/10/2009  Job:  [id number]   cc:   Ardyth Gal. Spruill, M.D.  Loralie Champagne, MD   Electronically Signed by Lauree Chandler MD on 10/11/2009 03:29:37 PM

## 2010-07-16 NOTE — Assessment & Plan Note (Signed)
OFFICE VISIT   KARYZMA, MUEHL  DOB:  Dec 20, 1931                                       11/12/2009  [id number]   The patient comes back today for evaluation of her arterial  insufficiency, right greater than left.  In summary, she is a 77-year-  old female that I saw at request Dr. Angelena Form for bilateral claudication  at 10-15 feet.  She has an ABI of 0.6 on the right and 0.83 on the left.  Distal abdominal aortogram reveals an occluded right external iliac  artery as well as diffuse disease throughout the left common iliac  artery.  She also has distal common femoral disease extending into the  profunda and superficial femoral on the right.  She had been cleared for  a cardiac perspective.  Her biggest issue has been longstanding COPD  secondary to tobacco abuse.  I sent her to see Dr. Melvyn Novas who has cleared  her from a pulmonary perspective although has cautioned her about the  need for smoking cessation.  The patient has not had a cigarette for the  past week and is going to continue to abstain prior to her operation.  She comes in today to discuss options of aortobifemoral versus left  iliac stenting and femoral-femoral bypass.  After discussion of the  risks and benefits of both procedures, we have elected to proceed with  aortobifemoral bypass graft.  Her operation has been scheduled for  Thursday, September 29th.  We discussed all possible complications  including the risk of bowel injury, the risk of hernia, the risk of  infection and distal embolization, as well as cardiopulmonary  complications.  All of her questions have been answered.  Today, I spent  in excess of 45 minutes going over the patient's records and talking  with her.     Eldridge Abrahams, MD  Electronically Signed   VWB/MEDQ  D:  11/12/2009  T:  11/13/2009  Job:  [id number]   cc:   Christena Deem. Melvyn Novas, MD, [facility]  Lauree Chandler, MD  Ardyth Gal. Spruill, M.D.

## 2010-07-16 NOTE — Assessment & Plan Note (Signed)
OFFICE VISIT   Brenda [name], Brenda [name]  DOB:  1931/08/29                                       10/29/2009  [id number]   REASON FOR VISIT:  Lower extremity vascular insufficiency, right greater  than left.   HISTORY:  This is a very pleasant 77 year old female seen at the request  of Dr. Angelena Form for evaluation of lower extremity vascular disease.  Patient has been complaining of pain in both calves right greater than  left after walking approximately 10-15 feet.  A duplex ultrasound  revealed an ABI on the right of 0.6 and the left of 0.83.  She has  undergone a distal abdominal aortogram which reveals an occluded right  external iliac artery as well as diffuse disease throughout the left  common and external iliac arteries.  She is referred to me for possible  surgical revascularization.  The patient has had an extensive cardiac  workup, she has undergone diagnostic cardiac catheterization by Dr.  Loralie Champagne as well as evaluation by Dr. Rayann Heman for arrhythmias.  She  has been cleared from a cardiac perspective.  She does complain of  shortness of breath with walking.  She has known COPD secondary to  longstanding tobacco abuse.   The patient suffers also from a history of ulcer disease.  She has  undergone ulcer operation in 1983 by Dr. Leafy Kindle.  She states that no  stomach or small bowel were removed at that time.  She has a history of  supraventricular tachycardia which has been controlled with metoprolol.  She is also on simvastatin for hypercholesterolemia.   REVIEW OF SYSTEMS:  GENERAL:  Positive for loss of appetite.  VASCULAR:  Positive for pain in the legs when walking and lying flat and  slurred speech.  CARDIAC:  Is positive for chest tightness and pressure as well as  shortness of breath with exertion and palpitations.  NEURO:  Positive for dizziness and headaches.  PULMONARY:  Positive bronchitis, wheezing.  URINARY:  Positive for  frequent urination.  ENT:  Positive for recent change in eyesight and change in hearing.  MUSCULOSKELETAL:  Positive for arthritis.  All other review of systems are negative.   PAST MEDICAL HISTORY:  COPD, hypertension, hypercholesterolemia, history  of SVT, ongoing tobacco abuse, COPD, gastritis.   PAST SURGICAL HISTORY:  1. Partial hysterectomy.  2. Complete hysterectomy.  3. Rotator cuff repair.  4. Gallbladder cholecystectomy.  5. Ulcer operation.   SOCIAL HISTORY:  She is widowed with two children.  She is retired.  She  smokes half pack a day.  Does not drink alcohol.   FAMILY HISTORY:  Negative for premature cardiovascular disease.   MEDICATIONS:  Include Diovan, metoprolol, Combivent, simvastatin.   ALLERGY:  TO SULFA AND NSAIDS.   PHYSICAL EXAM:  Heart rate 60, blood pressure 135/82, temperature is  98.1, O2 saturations 96% on room air.  General:  She is well-appearing,  no distress.  HEENT:  Within normal limits.  Lungs are coarse  bilaterally with mild wheezes.  Cardiovascular:  Regular rate and  rhythm.  No murmur.  No carotid bruits, palpable left femoral pulse,  nonpalpable right femoral pulse.  Abdomen is soft and nontender.  Well-  healed midline abdominal incision.  Musculoskeletal:  Without major  deformities.  Neuro:  There are no focal deficits.  Skin:  Without rash.  I have reviewed her abdominal aortogram which shows diffuse disease  throughout the iliac system on the left and occluded right external  iliac and distal common femoral artery.   ASSESSMENT:  Bilateral claudication right greater than left.   PLAN:  I outlined 2 options for the patient today.  1. Aortobifemoral bypass graft.  2. Stenting of her left iliac system followed by left to right femoral-      femoral bypass graft.   I spoke with Lauree Chandler, MD in regards to her cardiac risk  factors based on her cardiac catheterization by Dr. Aundra Dubin as well as  discussions with Dr.  Rayann Heman, she is felt to be low risk from operation  standpoint from a cardiac perspective.  I think the patient's biggest  comorbidity is going to be her COPD, underlying pulmonary disease.  For  that reason I am going to have her sent to be evaluated by pulmonary  medicine prior to proceeding with her operation.  If she is a reasonable  pulmonary risk I would recommend aortobifemoral bypass graft.  If she is  at high risk from a pulmonary perspective we would consider stenting of  the left iliac system followed by left-to-right femoral-femoral bypass  graft.  She will most likely require endarterectomy in the right distal  common femoral artery regardless.  The patient is going to be seen by  Pulmonary and follow up with me in 2 weeks.  We will hopefully get her  operation done in the immediate future.   We will also need to put her on aspirin for her operation.     Eldridge Abrahams, MD  Electronically Signed   VWB/MEDQ  D:  10/29/2009  [name]:  10/30/2009  Job:  [id number]   cc:   Lauree Chandler, MD  Dr. Montez Morita

## 2010-07-17 ENCOUNTER — Encounter: Payer: Self-pay | Admitting: *Deleted

## 2010-07-17 ENCOUNTER — Encounter: Payer: Self-pay | Admitting: Internal Medicine

## 2010-07-19 ENCOUNTER — Encounter: Payer: Self-pay | Admitting: Internal Medicine

## 2010-07-19 NOTE — Op Note (Signed)
Brenda [name], Brenda [name]              ACCOUNT NO.:  [id number]   MEDICAL RECORD NO.:  [id number]          PATIENT TYPE:  AMB   LOCATION:  ENDO                         FACILITY:  [facility]   PHYSICIAN:  James L. Rolla Flatten., M.D.DATE OF BIRTH:  13-Jul-1931   DATE OF PROCEDURE:  12/31/2004  DATE OF DISCHARGE:                                 OPERATIVE REPORT   PROCEDURE:  Colonoscopy.   MEDICATIONS:  Total unknown. The patient received some for the endo and  additional sedation by the nurse which was not recorded on my information.  Please see the nurse's note for the total.   SCOPE:  Pediatric adjustable scope.   INDICATIONS FOR PROCEDURE:  History of melena, endo right before this was  negative.   DESCRIPTION OF PROCEDURE:  The procedure including the risks and benefits  had been discussed with the patient in the office and consent obtained. In  the left lateral decubitus position, the Olympus pediatric adjustable scope  was inserted, the patient had extensive diverticular disease over a tortuous  colon. We were able to pass some of this with multiple posteriors, but we  were unable to pass the scope beyond 50 cm. She has had a previous  hysterectomy and the feeling really of adhesions. Other than extensive  diverticular disease of the left colon no other abnormalities were found. At  this point, I felt it was in the best interest to terminate the procedure.  We tried to pass the scope in left lateral supine and right lateral  decubitus position. The sigmoid and rectum were normal other than the  diverticulosis.   ASSESSMENT:  1.  History of melena without obvious cause on aborted colonoscopy.  2.  Diverticulosis.   PLAN:  Will schedule a barium enema in 1-2 weeks and have her return to the  office in one month.           ______________________________  Joyice Faster. Rolla Flatten., M.D.     [name]  D:  12/31/2004  T:  12/31/2004  Job:  [id number]   cc:   Ardyth Gal. Spruill, M.D.  Fax:  [phone]

## 2010-07-19 NOTE — Op Note (Signed)
Brenda [name], Brenda [name]              ACCOUNT NO.:  [id number]   MEDICAL RECORD NO.:  [id number]          PATIENT TYPE:  AMB   LOCATION:  ENDO                         [facility]ACILITY:  [facility]   PHYSICIAN:  James L. Rolla Flatten., M.D.DATE OF BIRTH:  18-Feb-1932   DATE OF PROCEDURE:  12/31/2004  DATE OF DISCHARGE:                                 OPERATIVE REPORT   PROCEDURE:  Esophagogastroduodenoscopy.   MEDICATIONS:  Cetacaine spray, fentanyl 25 mcg, Versed 3 mg IV.   INDICATIONS FOR PROCEDURE:  History of ulcer disease with a history of  melena.   DESCRIPTION OF PROCEDURE:  The procedure had been explained to the patient  and consent obtained. With the patient in the left lateral decubitus  position, the Olympus scope was inserted into the esophagus with  agglutination and advanced. The stomach was entered, pylorus identified and  passed. The duodenum including the bulb and second portion were seen well  and were entirely unremarkable. The duodenum including the bulb and second  portion was normal. The pyloric channel, antrum and body of the stomach were  all normal. The fundus and cardia were seen well in the retroflexed view and  were normal. The scope was withdrawn and the esophagus was in essence  normal.   ASSESSMENT:  History of melena with negative endoscopy, no reason to explain  it.   PLAN:  Will proceed with colonoscopy at this time as planned.           ______________________________  Joyice Faster. Rolla Flatten., M.D.     [name]  D:  12/31/2004  T:  12/31/2004  Job:  [id number]   cc:   Ardyth Gal. Spruill, M.D.  Fax: [phone]

## 2010-07-19 NOTE — Op Note (Signed)
Brenda [name], Brenda [name]              ACCOUNT NO.:  [id number]   MEDICAL RECORD NO.:  [id number]          PATIENT TYPE:  OIB   LOCATION:  [id number]                         FACILITY:  [facility]   PHYSICIAN:  Onnie Graham, MD     DATE OF BIRTH:  04/04/1931   DATE OF PROCEDURE:  11/19/2005  DATE OF DISCHARGE:  11/20/2005                                 OPERATIVE REPORT   PREOPERATIVE DIAGNOSIS:  Bilateral parotid gland masses.   POSTOPERATIVE DIAGNOSIS:  Bilateral parotid gland masses.   PROCEDURE:  1. Left superficial parotidectomy with facial nerve dissection.  2. Nerve integrity monitoring for 2 hours.   SURGEON:  Melida Quitter, M.D.   ASSISTANT:  Jerrell Belfast, M.D.   ANESTHESIA:  General endotracheal anesthesia.   COMPLICATIONS:  None.   INDICATIONS:  The patient is a 77 year old white female who has had swelling  in both sides of her neck for over 5 years.  The swelling is constant and  the size seems to fluctuated a bit.  Sometimes the areas get sore.  Examination demonstrated bilateral parotid region masses.  She underwent CT  scanning that confirmed bilateral parotid gland masses with 2 in each gland.  She presents to the operating room for surgical management of the left side  because the 2 masses are closest to her facial nerve on that side.   FINDINGS:  Two separate masses were palpated in the superficial lobe of the  parotid gland.  The deep gland was found to be clear of any mass.  A small  lymph node was also removed in the inferior gland.   DESCRIPTION OF PROCEDURE:  The patient was identified in the holding room  and informed consent having been obtained, the patient was moved to the  operating suite and put on the operative table in supine position.  Anesthesia was induced and the patient was intubated by anesthesia team  without difficulty.  The patient was given intravenous antibiotics during  the case.  The eyes were taped closed and the facial nerve monitor was  attached in the standard fashion.  The bed was turned 90 degrees from  Anesthesia.  The preauricular incision was marked with a marking pen and  injected with 1% lidocaine with 100,000 epinephrine.  The left neck and face  were prepped and draped in sterile fashion.  A sterile clear drape was  placed across the patient's face.  Incision was then made with a 15 blade  scalpel through the skin and extended through subcutaneous tissue using a  Bovie electrocautery.  The subcutaneous flap was then elevated in an  anterior direction over the gland.  The earlobe was then retracted  posteriorly and held in place with a 2-0 silk suture.  The gland was then  dissected from the tragal cartilage slightly with sharp dissection.  This  was extended down on the mastoid tip, elevating the tail of the parotid  gland off the tip.  The sternocleidomastoid muscle was then encountered and  dissected free from the parotid tail.  The main trunk of the facial nerve  was then dissected free  at the stylomastoid foramen and was then traced in a  distal direction to the pes.  The inferior division was then traced  distally, elevating the lower part of the gland off of the nerve.  The  facial nerve monitor was used during this portion of the procedure to guide  dissection.  Bipolar electrocautery was used to divide tissue to avoid  damage to the nerves.  Branches were traced as the nerve was dissected free  and the gland further elevated in a superior direction.  The gland was  divided up to the superior division, which was then traced out as well, and  the gland continued to be elevated off of the nerve.  It was found that the  tumors were in the superficial lobe and elevated with the gland.  The more  distal branches of the superior division were all identified and traced,  then removing the gland with the tumors.  This was passed to Nursing for  Pathology.  The dissection site was then copiously irrigated with  saline.  Bleeding was controlled with bipolar electrocautery.  A 10-French suction  drain was then placed in the dissection site, coming out posterior to the  incision.  It was secured to the skin level using 2-0 nylon in a standard  drain stitch.  The incision was then closed with 3-0 Vicryl in a simple  running fashion in the subcutaneous layer.  The drain was then hooked to  suction.  The skin was closed with 5-0 nylon in a simple running fashion.  Prior to closure, the nerve was found to stimulate on 0.5 mA, including the  entire lower division and the buccal and eye branch of the upper vision.  An  additional branch that appeared to be going to the frontalis muscle did not  stimulate at the end of the case, but was seen to be intact.  At this point,  the incision was coated with bacitracin ointment.  She was turned back to  Anesthesia for wake-up and was extubated and moved to the recovery room in  stable condition.      Onnie Graham, MD  Electronically Signed     DDB/MEDQ  D:  11/19/2005  T:  11/21/2005  Job:  [id number]

## 2010-07-19 NOTE — Op Note (Signed)
NAMEANESHIA, VILAS              ACCOUNT NO.:  [id number]   MEDICAL RECORD NO.:  [id number]          PATIENT TYPE:  AMB   LOCATION:  SDS                          FACILITY:  [facility]   PHYSICIAN:  Onnie Graham, MD     DATE OF BIRTH:  [birth year]   DATE OF PROCEDURE:  04/10/2006  DATE OF DISCHARGE:                               OPERATIVE REPORT   PREOPERATIVE DIAGNOSES:  1. Right parotid mass.  2. Right melolabial skin lesion, 0.5 cm.   POSTOPERATIVE DIAGNOSES:  1. Right parotid mass.  2. Right melolabial skin lesion, 0.5 cm.   PROCEDURE:  1. Right superficial parotidectomy.  2. Excision of right melolabial skin lesion.   SURGEON:  Melida Quitter, MD   ASSISTANT:  Jerrell Belfast, MD   ANESTHESIA:  General endotracheal.   COMPLICATIONS:  None.   INDICATIONS:  The patient is a 77 year old white female who presented  with bilateral parotid masses and has undergone a left superficial  parotidectomy in September.  This demonstrated a Warthin's tumor.  With  2 masses in the right parotid and the likelihood of these growing with  time, she presents for excision of these tumors as well.  A skin lesion  on the right melolabial fold has been present for 5 years and has slowly  increased in size.  She presents for surgical management.   FINDINGS:  1. There was a raised round pigmentless lesion in the right melolabial      fold that was excised.  2. There were 2 masses within the right superficial parotid gland that      were removed.  The inferior division stimulated at 0.5 mA after the      procedure.  The superior division did not stimulate, but by visual      inspection was intact.   DESCRIPTION OF PROCEDURE:  The patient was identified in the holding  room and informed consent having been obtained, the patient was moved to  the operating suite and put on the operating table in supine position.  Anesthesia was induced and patient is intubated by the anesthesia team  without  difficulty.  The patient given intravenous antibiotics during  the case.  The eyes were taped closed and the bed was turned 90 degrees  from anesthesia.  Incisions were marked with a marking pen and injected  with 1% lidocaine with 1:100,000 of epinephrine.  The face was prepped  and draped in sterile fashion after replacement of the nerve integrity  monitor in the standard fashion.  The monitor was turned on during the  case.  An elliptical incision was made around the melolabial lesion  using a 15 blade scalpel.  The lesion was removed using the scalpel.  Bleeding was controlled with electrocautery.  The elliptical defect was  then closed with 4-0 Vicryl suture in a simple interrupted fashion in  the deep tissues and 5-0 nylon in a simple running fashion in the skin.  The preauricular and neck incision was then made with a 15 blade scalpel  and extended with Bovie electrocautery through the subcutaneous tissues.  A subcutaneous  flap was then elevated over the parotid fascia using  Bovie electrocautery.  The dissection was then performed posteriorly  around the tail of the parotid gland, exposing the sternocleidomastoid  muscle.  This was connected to the preauricular region, where the gland  was divided from the tragal cartilage with careful dissection.  The  facial nerve was encountered in this process and was then traced in an  antegrade fashion, first up the superior division and then into the  inferior division.  The nerve was carefully traced using a McCabe  dissector and was divided sequentially using bipolar electrocautery and  scissors.  This proceeded from superior to inferior, elevating the gland  and tumors as this progressed.  The tissue was divided inferiorly and  the inferior division was fully traced and skeletonize, elevating the  gland off of it.  The gland was then removed from the anterior soft  tissue connections and passed to nursing for Pathology.  Bleeding was   controlled with bipolar electrocautery.  A couple of bleeding vessels  were suture-ligated.  At this point, the defect was copiously irrigated  with saline.  The nerve was stimulated and findings noted above.  At  this point, a Keenan Bachelor drain was placed in the inferior portion of the  defect coming out the a stab incision in the skin posterior to the  incision.  This was secured to the skin using a 2-0 nylon suture in a  standard drain stitch.  The skin flap was then laid back down and closed  in the subcutaneous layer using 4-0 Vicryl suture in a simple running  fashion.  Skin was then closed with 5-0 nylon in a simple running  fashion.  Bacitracin was then added to the incisions after the patient  was cleaned off.  The drain was hooked to bulb suction after closure and  was then secured to the right shoulder.  The patient was then turned  back to Anesthesia and was extubated and moved to the recovery room in  stable condition.      Onnie Graham, MD  Electronically Signed     DDB/MEDQ  D:  04/10/2006  T:  04/10/2006  Job:  [id number]

## 2010-07-22 ENCOUNTER — Encounter: Payer: Self-pay | Admitting: Internal Medicine

## 2010-07-22 ENCOUNTER — Ambulatory Visit (INDEPENDENT_AMBULATORY_CARE_PROVIDER_SITE_OTHER): Payer: Medicare Other | Admitting: Internal Medicine

## 2010-07-22 DIAGNOSIS — I498 Other specified cardiac arrhythmias: Secondary | ICD-10-CM

## 2010-07-22 DIAGNOSIS — R001 Bradycardia, unspecified: Secondary | ICD-10-CM

## 2010-07-22 DIAGNOSIS — I4891 Unspecified atrial fibrillation: Secondary | ICD-10-CM

## 2010-07-22 DIAGNOSIS — I10 Essential (primary) hypertension: Secondary | ICD-10-CM

## 2010-07-22 DIAGNOSIS — F17200 Nicotine dependence, unspecified, uncomplicated: Secondary | ICD-10-CM

## 2010-07-22 DIAGNOSIS — I495 Sick sinus syndrome: Secondary | ICD-10-CM

## 2010-07-22 DIAGNOSIS — J449 Chronic obstructive pulmonary disease, unspecified: Secondary | ICD-10-CM

## 2010-07-22 LAB — BASIC METABOLIC PANEL WITH GFR
BUN: 27 mg/dL — ABNORMAL HIGH (ref 6–23)
CO2: 27 meq/L (ref 19–32)
Calcium: 8.5 mg/dL (ref 8.4–10.5)
Chloride: 102 meq/L (ref 96–112)
Creatinine, Ser: 1.8 mg/dL — ABNORMAL HIGH (ref 0.4–1.2)
GFR: 29.94 mL/min — ABNORMAL LOW
Glucose, Bld: 76 mg/dL (ref 70–99)
Potassium: 4.3 meq/L (ref 3.5–5.1)
Sodium: 139 meq/L (ref 135–145)

## 2010-07-22 LAB — CBC WITH DIFFERENTIAL/PLATELET
Basophils Absolute: 0 K/uL (ref 0.0–0.1)
Basophils Relative: 0.2 % (ref 0.0–3.0)
Eosinophils Absolute: 0.1 K/uL (ref 0.0–0.7)
Eosinophils Relative: 1.1 % (ref 0.0–5.0)
HCT: 35.7 % — ABNORMAL LOW (ref 36.0–46.0)
Hemoglobin: 12.2 g/dL (ref 12.0–15.0)
Lymphocytes Relative: 21.7 % (ref 12.0–46.0)
Lymphs Abs: 2.1 K/uL (ref 0.7–4.0)
MCHC: 34.1 g/dL (ref 30.0–36.0)
MCV: 88.7 fl (ref 78.0–100.0)
Monocytes Absolute: 0.7 K/uL (ref 0.1–1.0)
Monocytes Relative: 7.6 % (ref 3.0–12.0)
Neutro Abs: 6.8 K/uL (ref 1.4–7.7)
Neutrophils Relative %: 69.4 % (ref 43.0–77.0)
Platelets: 192 K/uL (ref 150.0–400.0)
RBC: 4.03 Mil/uL (ref 3.87–5.11)
RDW: 18 % — ABNORMAL HIGH (ref 11.5–14.6)
WBC: 9.8 K/uL (ref 4.5–10.5)

## 2010-07-22 LAB — HEPATIC FUNCTION PANEL
AST: 20 U/L (ref 0–37)
Albumin: 3 g/dL — ABNORMAL LOW (ref 3.5–5.2)
Alkaline Phosphatase: 100 U/L (ref 39–117)

## 2010-07-22 LAB — T4, FREE: Free T4: 0.67 ng/dL (ref 0.60–1.60)

## 2010-07-22 LAB — TSH: TSH: 7.49 u[IU]/mL — ABNORMAL HIGH (ref 0.35–5.50)

## 2010-07-22 MED ORDER — METOPROLOL TARTRATE 25 MG PO TABS: ORAL_TABLET | ORAL | Status: DC

## 2010-07-22 NOTE — Patient Instructions (Signed)
Your physician recommends that you schedule a follow-up appointment in: 8 weeks with Dr Rayann Heman  Your physician has recommended you make the following change in your medication: decrease Metoprolol to 12.5mg (1/2 of a 25mg  tablet) twice daily

## 2010-07-22 NOTE — Assessment & Plan Note (Signed)
Her cessation is advised

## 2010-07-22 NOTE — Progress Notes (Signed)
The patient presents today for routine electrophysiology followup.  Since last being seen in our clinic, she has been hospitalized for GI bleeding 11/11.  She was on pradaxa at that time.  She appears to have done reasonably well since that time.  She reports unsteadiness with ambulation.  She is very pleased that she was able to quit smoking and feels that her breathing has improved.  Today, she denies symptoms of palpitations, chest pain, shortness of breath, orthopnea, PND, lower extremity edema, dizziness, presyncope, syncope, or neurologic sequela.  The patient feels that she is tolerating medications without difficulties and is otherwise without complaint today.   Past Medical History  Diagnosis Date  . HYPERTENSION   . Atrial fibrillation     persistent atrial fibrillation, prior GI bleeding, presently not anticoagulated  . CAROTID ARTERY DISEASE   . COPD     quit smoking 10/11  . GASTRITIS   . Edema   . Allergic rhinitis   . PVD (peripheral vascular disease)     s/p aortobifemoral bypass 12/08/09   Past Surgical History  Procedure Date  . Total abdominal hysterectomy   . Cholecystectomy   . Gastrectomy   . Rotator cuff repair   . Parotid gland tumors removed   . Aortobifemoral bypass 12/08/09     Current Outpatient Prescriptions  Medication Sig Dispense Refill  . amiodarone (PACERONE) 200 MG tablet Take 200 mg by mouth 2 (two) times daily.        . ferrous fumarate (FERRO-SEQUELS) 50 MG CR tablet Take 50 mg by mouth daily.        . furosemide (LASIX) 20 MG tablet Take 20 mg by mouth daily.        . metoprolol (LOPRESSOR) 25 MG tablet 1/2 tablet twice daily  30 tablet  6  . potassium chloride SA (K-DUR,KLOR-CON) 20 MEQ tablet Take 20 mEq by mouth daily.        Marland Kitchen DISCONTD: metoprolol (LOPRESSOR) 50 MG tablet Take 25 mg by mouth 2 (two) times daily.        Marland Kitchen DISCONTD: dabigatran (PRADAXA) 150 MG CAPS Take 150 mg by mouth every 12 (twelve) hours.        Marland Kitchen DISCONTD: digoxin  (LANOXIN) 0.125 MG tablet Take 125 mcg by mouth daily.          Allergies  Allergen Reactions  . Nsaids   . Sulfonamide Derivatives     History   Social History  . Marital Status: Widowed    Spouse Name: N/A    Number of Children: N/A  . Years of Education: N/A   Occupational History  . Not on file.   Social History Main Topics  . Smoking status: Former Smoker    Quit date: 12/21/2009  . Smokeless tobacco: Not on file  . Alcohol Use: No  . Drug Use: No  . Sexually Active: Not on file   Other Topics Concern  . Not on file   Social History Narrative   Pt lives in [place] with her son.  She is retired from [employer].  She smokes 1/2 pack a day (55+ pkyr), she quit 10/11    Family History  Problem Relation Age of Onset  . Coronary artery disease Mother   . Coronary artery disease Sister   . Stroke Father     ROS-  All systems are reviewed and are negative except as outlined in the HPI above    Physical Exam: Filed Vitals:   07/22/10 1434  BP: 130/68  Pulse: 47  Height: 5' (1.524 m)  Weight: 121 lb (54.885 kg)    GEN- The patient is elderly and thin, alert and oriented x 3 today.   Head- normocephalic, atraumatic Eyes-  Sclera clear, conjunctiva pink Ears- hearing intact Oropharynx- clear Neck- supple, no JVP Lymph- no cervical lymphadenopathy Lungs- Clear to ausculation bilaterally, normal work of breathing Heart- Regular rate and rhythm, no murmurs, rubs or gallops, PMI not laterally displaced GI- soft, NT, ND, + BS Extremities- + clubbing, no cyanosis, or edema MS- age appropriate muscle atrophy Skin- no rash or lesion Psych- euthymic mood, full affect Neuro- strength and sensation are intact  EKG today reveals sinus bradycardia 47 bpm, PR 202, QTc 416, otherwise normal ekg  Assessment and Plan:

## 2010-07-22 NOTE — Assessment & Plan Note (Addendum)
Stable No change required today  Given recent renal failure, we will check BMET today.

## 2010-07-22 NOTE — Assessment & Plan Note (Signed)
Stable No change required today  

## 2010-07-22 NOTE — Assessment & Plan Note (Signed)
Decrease metoprolol to 12.5mg  BID at this time She does not appear to be overtly symptomatic

## 2010-07-22 NOTE — Assessment & Plan Note (Addendum)
Maintaining sinus rhythm with amiodarone 200mg  daily Check LFTs and TFTs today Not a candidate for coumadin at this time due to recent GI bleeding.  She has a very high risk for stroke.  We will reconsider anticoagulation in the future.  Check CBC today given anemia.

## 2010-08-02 ENCOUNTER — Telehealth: Payer: Self-pay | Admitting: Internal Medicine

## 2010-08-02 MED ORDER — DILTIAZEM HCL ER COATED BEADS 180 MG PO CP24: 180.0000 mg | ORAL_CAPSULE | Freq: Every day | ORAL | Status: DC

## 2010-08-02 NOTE — Telephone Encounter (Signed)
rx sent in to pharmacy. Pt notified

## 2010-08-02 NOTE — Telephone Encounter (Signed)
Pt needs refill on Taztia.  She has one capsule left.  Please call Vladimir Faster on [employer] [phone].

## 2010-08-07 ENCOUNTER — Other Ambulatory Visit (INDEPENDENT_AMBULATORY_CARE_PROVIDER_SITE_OTHER): Payer: Medicare Other | Admitting: *Deleted

## 2010-08-07 DIAGNOSIS — I10 Essential (primary) hypertension: Secondary | ICD-10-CM

## 2010-08-07 LAB — BASIC METABOLIC PANEL
BUN: 19 mg/dL (ref 6–23)
Calcium: 9.1 mg/dL (ref 8.4–10.5)
Creatinine, Ser: 1.5 mg/dL — ABNORMAL HIGH (ref 0.4–1.2)
GFR: 36.9 mL/min — ABNORMAL LOW (ref >60.00)

## 2010-08-12 ENCOUNTER — Telehealth: Payer: Self-pay | Admitting: Internal Medicine

## 2010-08-12 NOTE — Telephone Encounter (Signed)
Pt was taken of water pill about 3 weeks ago, after having blood work done, wants to know why, having swelling since yesterday

## 2010-08-12 NOTE — Telephone Encounter (Signed)
Patient states that after her blood work her fluid pill and Potassium were d/c'd  Looks to be due to renal insuff  She is now c/o swelling in feet and ankles.  The swelling does not go down at night and she keeps her feet elevated during the day  Her Creat in down to 1.5 Will have patient take 10mg  of Furosemide qod with Potassium for one week and call me back on Mon  This will hopefully help with her swelling

## 2010-08-19 ENCOUNTER — Telehealth: Payer: Self-pay | Admitting: Internal Medicine

## 2010-08-19 DIAGNOSIS — N289 Disorder of kidney and ureter, unspecified: Secondary | ICD-10-CM

## 2010-08-19 DIAGNOSIS — R609 Edema, unspecified: Secondary | ICD-10-CM

## 2010-08-19 NOTE — Telephone Encounter (Signed)
Pt to  rtn call to kelly today

## 2010-08-19 NOTE — Telephone Encounter (Signed)
Spoke with patient  Will have her come in tomorrow for BMP to assess renal function  Her swelling is much better  She also asked for pain medication and I advised her that Dr Rayann Heman did not prescribe this. Discussed with Dr Rayann Heman and he agrees  She can call surgeon or call Dr Spruill's office and find out who he wanted her to see for primary care

## 2010-08-20 ENCOUNTER — Other Ambulatory Visit (INDEPENDENT_AMBULATORY_CARE_PROVIDER_SITE_OTHER): Payer: Medicare Other | Admitting: *Deleted

## 2010-08-20 DIAGNOSIS — R609 Edema, unspecified: Secondary | ICD-10-CM

## 2010-08-20 DIAGNOSIS — N289 Disorder of kidney and ureter, unspecified: Secondary | ICD-10-CM

## 2010-08-20 DIAGNOSIS — R229 Localized swelling, mass and lump, unspecified: Secondary | ICD-10-CM

## 2010-08-20 LAB — BASIC METABOLIC PANEL
Calcium: 9.1 mg/dL (ref 8.4–10.5)
GFR: 26.9 mL/min — ABNORMAL LOW (ref >60.00)
Sodium: 140 mEq/L (ref 135–145)

## 2010-09-12 ENCOUNTER — Encounter: Payer: Self-pay | Admitting: Internal Medicine

## 2010-09-16 ENCOUNTER — Encounter: Payer: Self-pay | Admitting: Internal Medicine

## 2010-09-16 ENCOUNTER — Ambulatory Visit (INDEPENDENT_AMBULATORY_CARE_PROVIDER_SITE_OTHER): Payer: Medicare Other | Admitting: Internal Medicine

## 2010-09-16 DIAGNOSIS — I5033 Acute on chronic diastolic (congestive) heart failure: Secondary | ICD-10-CM

## 2010-09-16 DIAGNOSIS — I10 Essential (primary) hypertension: Secondary | ICD-10-CM

## 2010-09-16 DIAGNOSIS — R609 Edema, unspecified: Secondary | ICD-10-CM

## 2010-09-16 DIAGNOSIS — I4891 Unspecified atrial fibrillation: Secondary | ICD-10-CM

## 2010-09-16 LAB — HEPATIC FUNCTION PANEL
ALT: 24 U/L (ref 0–35)
Albumin: 3.4 g/dL — ABNORMAL LOW (ref 3.5–5.2)
Alkaline Phosphatase: 105 U/L (ref 39–117)
Bilirubin, Direct: 0.1 mg/dL (ref 0.0–0.3)
Total Protein: 6.5 g/dL (ref 6.0–8.3)

## 2010-09-16 LAB — BASIC METABOLIC PANEL
Chloride: 101 mEq/L (ref 96–112)
Potassium: 3.8 mEq/L (ref 3.5–5.1)

## 2010-09-16 NOTE — Assessment & Plan Note (Signed)
Likely due to diastolic dysfunction and diltiazem Salt restriction Support hose Consider low dose hctz if creatinine is improved

## 2010-09-16 NOTE — Assessment & Plan Note (Signed)
Above goal today, though she reports good BP control at home. Check BMET today Consider low dose hctz vs ace inhibitor if remains elevated and creatinine is improved

## 2010-09-16 NOTE — Assessment & Plan Note (Signed)
Maintaining sinus rhythm Off coumadin due to prior GI bleeding Although I have been clear in the past that she should be on amiodarone 200mg  daily, she appears to be still taking it BID. I had a long discussion with her and her daughter about her medications today.  We will decrease amiodarone to 200mg  daily at this time. Check LFTs and TFTs today.

## 2010-09-16 NOTE — Patient Instructions (Signed)
Your physician recommends that you schedule a follow-up appointment in: 3 months with Dr Rayann Heman  You need to watch the [place] in your diet and get support hose  Make sure you are only taking Amiodarone 200mg  once daily  Your physician recommends that you return for lab work today BMP/Liver Panel/TSH

## 2010-09-16 NOTE — Progress Notes (Signed)
The patient presents today for routine electrophysiology followup.  Since last being seen in our clinic, the patient reports doing reasonably well.   Her primary concern today is with chronic backpain.  She is maintaining sinus rhythm with amiodarone.  She has mild edema with cardizem.  Today, she denies symptoms of palpitations, chest pain, shortness of breath, orthopnea, PND, dizziness, presyncope, syncope, or neurologic sequela.  The patient feels that she is tolerating medications without difficulties and is otherwise without complaint today.   Past Medical History  Diagnosis Date  . HYPERTENSION   . Atrial fibrillation     persistent atrial fibrillation, prior GI bleeding, presently not anticoagulated  . CAROTID ARTERY DISEASE   . COPD     quit smoking 10/11  . GASTRITIS     with GI bleeding  . Edema   . Allergic rhinitis   . PVD (peripheral vascular disease)     s/p aortobifemoral bypass 12/08/09  . Chronic diastolic heart failure due to hypertrophic obstructive cardiomyopathy   . Chronic renal insufficiency    Past Surgical History  Procedure Date  . Total abdominal hysterectomy   . Cholecystectomy   . Gastrectomy   . Rotator cuff repair   . Parotid gland tumors removed   . Aortobifemoral bypass 12/08/09     Current Outpatient Prescriptions  Medication Sig Dispense Refill  . amiodarone (PACERONE) 200 MG tablet Take 1 tablet (200 mg total) by mouth daily.  30 tablet  11  . diltiazem (CARDIZEM CD) 180 MG 24 hr capsule Take 1 capsule (180 mg total) by mouth daily.  30 capsule  11  . ferrous fumarate (FERRO-SEQUELS) 50 MG CR tablet Take 50 mg by mouth daily.        . metoprolol (LOPRESSOR) 25 MG tablet 1/2 tablet twice daily  30 tablet  6    Allergies  Allergen Reactions  . Nsaids   . Sulfonamide Derivatives     History   Social History  . Marital Status: Widowed    Spouse Name: N/A    Number of Children: N/A  . Years of Education: N/A   Occupational History  .  Not on file.   Social History Main Topics  . Smoking status: Former Smoker    Quit date: 12/21/2009  . Smokeless tobacco: Not on file   Comment: quit  . Alcohol Use: No  . Drug Use: No  . Sexually Active: Not on file   Other Topics Concern  . Not on file   Social History Narrative   Pt lives in [place] with her son.  She is retired from [employer] information [employer] manager.  She smokes 1/2 pack a day (55+ pkyr), she quit 10/11    Family History  Problem Relation Age of Onset  . Coronary artery disease Mother   . Coronary artery disease Sister   . Stroke Father    Physical Exam: Filed Vitals:   09/16/10 1049  BP: 160/77  Pulse: 63  Resp: 16  Height: 5' (1.524 m)  Weight: 132 lb (59.875 kg)    GEN- The patient is thin and elderly appearing, alert and oriented x 3 today.   Head- normocephalic, atraumatic Eyes-  Sclera clear, conjunctiva pink Ears- hearing intact Oropharynx- clear Neck- supple, no JVP Lymph- no cervical lymphadenopathy Lungs- prolonged expiratory phase, normal work of breathing Heart- Regular rate and rhythm, no murmurs, rubs or gallops, PMI not laterally displaced GI- soft, NT, ND, + BS Extremities- no clubbing, cyanosis, trace edema MS- no significant deformity or  atrophy Skin- no rash or lesion Psych- euthymic mood, full affect Neuro- strength and sensation are intact  Assessment and Plan:

## 2010-09-17 ENCOUNTER — Telehealth: Payer: Self-pay | Admitting: Internal Medicine

## 2010-09-17 NOTE — Telephone Encounter (Signed)
Pt was suppose to have a medication called in yesterday after her visit to replace the lasix and it still has not been called in yet pls call into [facility] [address]. She was also to get support hose and she can't afford to get them she is on SSI and gets paid once a month.

## 2010-09-17 NOTE — Telephone Encounter (Signed)
Her kidney function is normal HCTZ what dose do you want to start   She can not afford the support hose that we wrote for her  They were $55 and so she found some for $33 at [facility]

## 2010-09-18 MED ORDER — HYDROCHLOROTHIAZIDE 12.5 MG PO CAPS: 12.5000 mg | ORAL_CAPSULE | Freq: Every day | ORAL | Status: DC

## 2010-09-18 NOTE — Telephone Encounter (Signed)
Will have her start her HCTZ 12.5mg  daily  Will have BMP in 2 weeks 10/03/10 at 11:00

## 2010-09-18 NOTE — Telephone Encounter (Signed)
Start HCTZ 12.5mg  daily and repeat bmet in 2 weeks.

## 2010-09-23 ENCOUNTER — Ambulatory Visit: Payer: Medicare Other | Admitting: Surgery

## 2010-10-03 ENCOUNTER — Ambulatory Visit (INDEPENDENT_AMBULATORY_CARE_PROVIDER_SITE_OTHER): Payer: Medicare Other | Admitting: *Deleted

## 2010-10-03 DIAGNOSIS — I10 Essential (primary) hypertension: Secondary | ICD-10-CM

## 2010-10-03 DIAGNOSIS — I6529 Occlusion and stenosis of unspecified carotid artery: Secondary | ICD-10-CM

## 2010-10-03 DIAGNOSIS — I4891 Unspecified atrial fibrillation: Secondary | ICD-10-CM

## 2010-10-03 LAB — BASIC METABOLIC PANEL
BUN: 20 mg/dL (ref 6–23)
Calcium: 9 mg/dL (ref 8.4–10.5)
GFR: 34.68 mL/min — ABNORMAL LOW (ref >60.00)
Glucose, Bld: 88 mg/dL (ref 70–99)
Sodium: 138 mEq/L (ref 135–145)

## 2010-10-03 LAB — LDL CHOLESTEROL, DIRECT: Direct LDL: 139.9 mg/dL

## 2010-10-03 LAB — HEPATIC FUNCTION PANEL
ALT: 16 U/L (ref 0–35)
Albumin: 3.8 g/dL (ref 3.5–5.2)
Total Bilirubin: 0.9 mg/dL (ref 0.3–1.2)

## 2010-10-03 LAB — LIPID PANEL
Cholesterol: 227 mg/dL — ABNORMAL HIGH (ref 0–200)
HDL: 47.8 mg/dL (ref >39.00)
Triglycerides: 161 mg/dL — ABNORMAL HIGH (ref 0.0–149.0)

## 2010-10-03 LAB — T4, FREE: Free T4: 0.71 ng/dL (ref 0.60–1.60)

## 2010-10-03 LAB — TSH: TSH: 6.41 u[IU]/mL — ABNORMAL HIGH (ref 0.35–5.50)

## 2010-11-08 ENCOUNTER — Encounter: Payer: Self-pay | Admitting: Surgery

## 2010-11-11 ENCOUNTER — Encounter (INDEPENDENT_AMBULATORY_CARE_PROVIDER_SITE_OTHER): Payer: Medicare Other | Admitting: *Deleted

## 2010-11-11 ENCOUNTER — Ambulatory Visit (INDEPENDENT_AMBULATORY_CARE_PROVIDER_SITE_OTHER): Payer: Medicare Other | Admitting: Surgery

## 2010-11-11 ENCOUNTER — Encounter: Payer: Self-pay | Admitting: Surgery

## 2010-11-11 VITALS — BP 123/75 | HR 52 | Resp 16 | Wt 134.0 lb

## 2010-11-11 DIAGNOSIS — Z48812 Encounter for surgical aftercare following surgery on the circulatory system: Secondary | ICD-10-CM

## 2010-11-11 DIAGNOSIS — I70219 Atherosclerosis of native arteries of extremities with intermittent claudication, unspecified extremity: Secondary | ICD-10-CM

## 2010-11-11 DIAGNOSIS — S31109A Unspecified open wound of abdominal wall, unspecified quadrant without penetration into peritoneal cavity, initial encounter: Secondary | ICD-10-CM

## 2010-11-11 DIAGNOSIS — Z48811 Encounter for surgical aftercare following surgery on the nervous system: Secondary | ICD-10-CM

## 2010-11-11 DIAGNOSIS — I739 Peripheral vascular disease, unspecified: Secondary | ICD-10-CM

## 2010-11-11 NOTE — Progress Notes (Signed)
[facility]   Patient name: Brenda [name] MRN: [id number] DOB: 03-May-1931 Sex: female   Referred by: Angelena Form  Reason for referral:  Chief Complaint  Patient presents with  . PVD    3 mo followup of groin with doppler    HISTORY OF PRESENT ILLNESS: The patient returns today for followup. She is status post aortobifemoral bypass graft on 12/07/2009 for lifestyle limiting claudication. She underwent groin reexploration by Dr. Doren Custard for lymphatic leak. Her cultures grew out Pseudomonas. She is seen by infectious disease and placed on antibiotic therapy. She came back and cell Dr. Kellie Simmering on April 17 for drainage in her left groin however at that time nothing could be expressed and there was no erythema or fluctuance. She had a CT scan at that time that showed no evidence of infection either by CT or clinically. My suspicion was that she developed a small lymphocele was spontaneously drained and then healed. She states that she is still having trouble with walking. She describes a pins and needle like feeling and sensation in her thighs when she gets when she starts to walk she says she has a history of a pants nerve in her back  Past Medical History  Diagnosis Date  . HYPERTENSION   . Atrial fibrillation     persistent atrial fibrillation, prior GI bleeding, presently not anticoagulated  . CAROTID ARTERY DISEASE   . COPD     quit smoking 10/11  . GASTRITIS     with GI bleeding  . Edema   . Allergic rhinitis   . PVD (peripheral vascular disease)     s/p aortobifemoral bypass 12/08/09  . Chronic diastolic heart failure due to hypertrophic obstructive cardiomyopathy   . Chronic renal insufficiency     Past Surgical History  Procedure Date  . Total abdominal hysterectomy   . Cholecystectomy   . Gastrectomy   . Rotator cuff repair   . Parotid gland tumors removed   . Aortobifemoral bypass 12/08/09     History   Social History  . Marital Status:  Widowed    Spouse Name: N/A    Number of Children: N/A  . Years of Education: N/A   Occupational History  . Not on file.   Social History Main Topics  . Smoking status: Former Smoker    Quit date: 12/21/2009  . Smokeless tobacco: Not on file   Comment: quit  . Alcohol Use: No  . Drug Use: No  . Sexually Active: Not on file   Other Topics Concern  . Not on file   Social History Narrative   Pt lives in [place] with her son.  She is retired from [employer] information centre manager.  She smokes 1/2 pack a day (55+ pkyr), she quit 10/11    Family History  Problem Relation Age of Onset  . Coronary artery disease Mother   . Coronary artery disease Sister   . Stroke Father     Allergies as of 11/11/2010 - Review Complete 11/11/2010  Allergen Reaction Noted  . Nsaids    . Sulfonamide derivatives      Current Outpatient Prescriptions on File Prior to Visit  Medication Sig Dispense Refill  . amiodarone (PACERONE) 200 MG tablet Take 1 tablet (200 mg total) by mouth daily.  30 tablet  11  . diltiazem (CARDIZEM CD) 180 MG 24 hr capsule Take 1 capsule (180 mg total) by mouth daily.  30 capsule  11  . ferrous fumarate (FERRO-SEQUELS) 50 MG  CR tablet Take 50 mg by mouth daily.        . hydrochlorothiazide (,MICROZIDE/HYDRODIURIL,) 12.5 MG capsule Take 1 capsule (12.5 mg total) by mouth daily.  30 capsule  11  . metoprolol (LOPRESSOR) 25 MG tablet 1/2 tablet twice daily  30 tablet  6     REVIEW OF SYSTEMS: No change from 10/29/2009 per the patient  PHYSICAL EXAMINATION: General: The patient appears their stated age.  Vital signs are BP 123/75  Pulse 52  Resp 16  Wt 134 lb (60.782 kg) Pulmonary: There is a good air exchange bilaterally without wheezing or rales. Abdomen: Soft and non-tender with normal pitch bowel sounds. Incision is well healed without hernia Musculoskeletal: There are no major deformities.  There is no significant extremity pain. Neurologic: No focal weakness or paresthesias are  detected, Skin: There are no ulcer or rashes noted in either lower extremity Psychiatric: The patient has normal affect. Cardiovascular: There is a regular rate and rhythm without significant murmur appreciated. She has palpable pedal pulses   Outside Studies/Documentation Historical records were reviewed.  ABI today in the office was 1.0 on the right and 0.97 on the left both with triphasic signals*  Medical Decision Making  Brenda [name] is a 77 y.o. female who presents with:  Chief Complaint  Patient presents with  . PVD    3 mo followup of groin with doppler   the patient is doing very well from her aortobifemoral bypass graft. Her groins are soft without drainage. She does still complaining of leg pain which most likely given the pins and needle description is related to her pants nerve in her back. She has requested to see Dr. Vertell Limber and [facility] care appointment to refer her to him for evaluation of this problem. Otherwise I plan on seeing her back in one year with a repeat ultrasound    Medication changes include:none   BRABHAM,VANCE W [facility] Office: [phone]

## 2010-11-28 ENCOUNTER — Other Ambulatory Visit (HOSPITAL_COMMUNITY): Payer: Self-pay | Admitting: Cardiology

## 2010-11-29 ENCOUNTER — Encounter: Payer: Self-pay | Admitting: Internal Medicine

## 2010-12-02 ENCOUNTER — Ambulatory Visit: Payer: Medicare Other | Admitting: Internal Medicine

## 2010-12-11 ENCOUNTER — Encounter (HOSPITAL_COMMUNITY)
Admission: RE | Admit: 2010-12-11 | Discharge: 2010-12-11 | Disposition: A | Discharge status: Home / Self Care | Payer: Medicare Other | Source: Ambulatory Visit | Attending: Cardiology | Admitting: Cardiology

## 2010-12-11 DIAGNOSIS — R079 Chest pain, unspecified: Secondary | ICD-10-CM | POA: Insufficient documentation

## 2010-12-11 MED ORDER — TECHNETIUM TC 99M TETROFOSMIN IV KIT
10.0000 | PACK | Freq: Once | INTRAVENOUS | Status: AC | PRN
  Administered 2010-12-11: 10 via INTRAVENOUS

## 2010-12-11 MED ORDER — TECHNETIUM TC 99M TETROFOSMIN IV KIT
30.0000 | PACK | Freq: Once | INTRAVENOUS | Status: AC | PRN
  Administered 2010-12-11: 30 via INTRAVENOUS

## 2011-02-21 IMAGING — CT CT ANGIO ABDOMEN
2 of 7 series · 15 of 46 positions shown, 17 images · IV contrast (APPLIED)
Comparison: CT abdomen and pelvis 08/12/2005.

CTA ABDOMEN

CLINICAL DATA: Upper rectal bleed.  Evaluate for a airway and tear
the fistula.  Aortobifemoral graft.

CT ANGIOGRAPHY ABDOMEN AND PELVIS
TECHNIQUE: Multidetector CT imaging of the abdomen and pelvis was
performed using the standard protocol during bolus administration
of intravenous contrast.  Multiplanar reconstructed images
including MIPs were obtained and reviewed to evaluate the vascular
anatomy.
Contrast:  50 ml Omnipaque 300

[Series 4: cta abd/pel 2.0 st · axial · 0.64mm/px · z∈[-534,-214]mm · 12 of 176 slices shown, 14 images]
[im 8/176  soft-tissue]
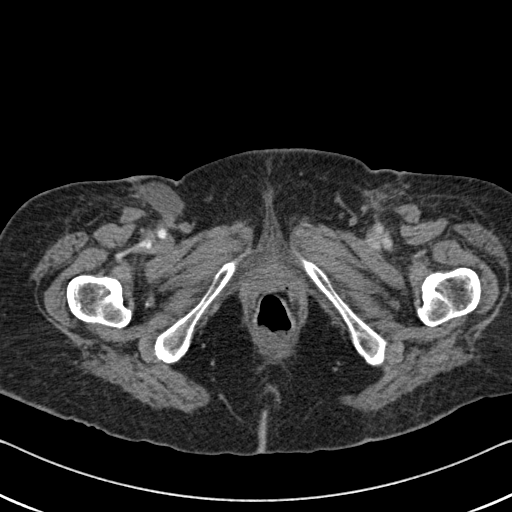
[im 8/176  bone]
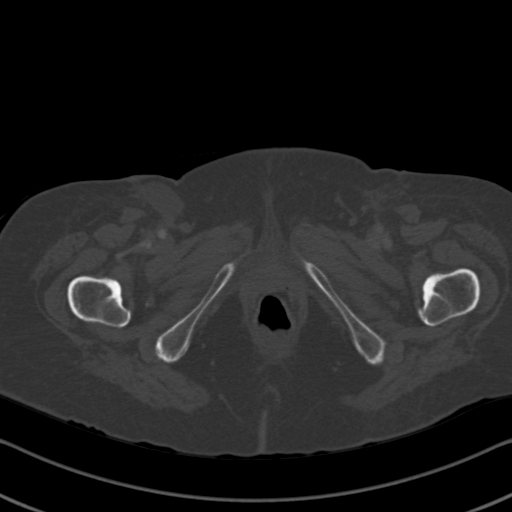
[im 23/176  soft-tissue]
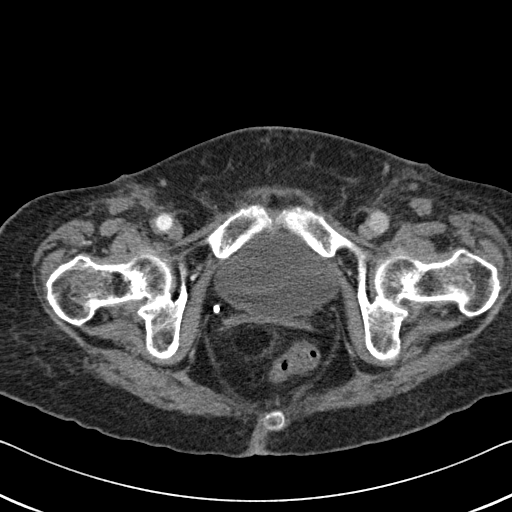
[im 39/176  soft-tissue]
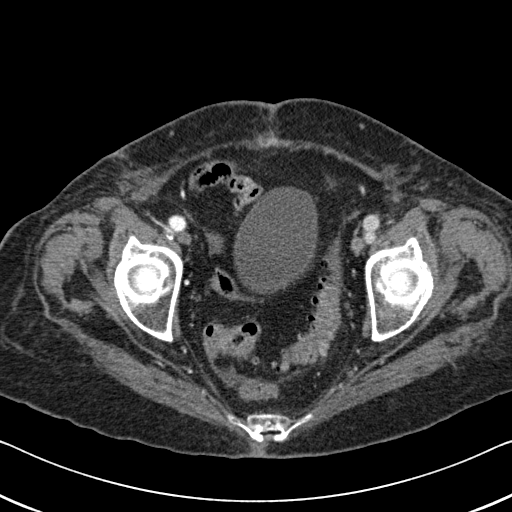
[im 54/176  soft-tissue]
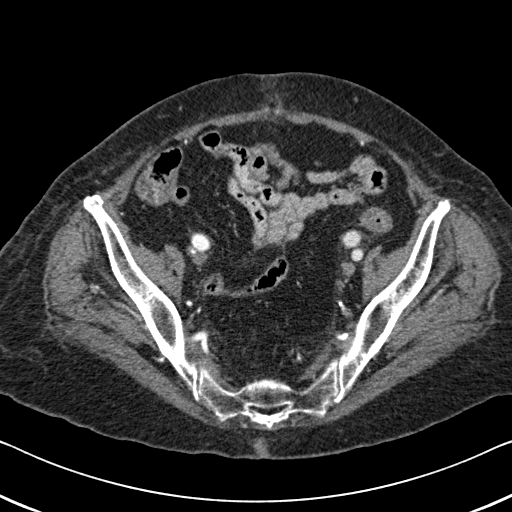
[im 69/176  soft-tissue]
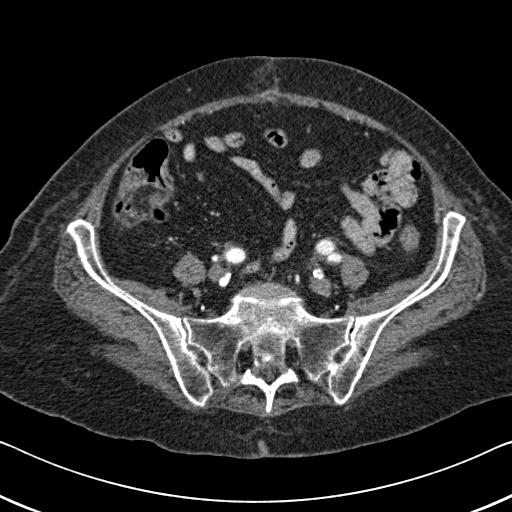
[im 84/176  soft-tissue]
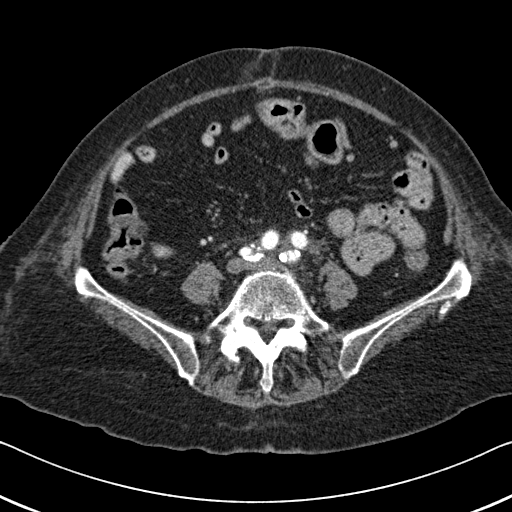
[im 92/176  soft-tissue]
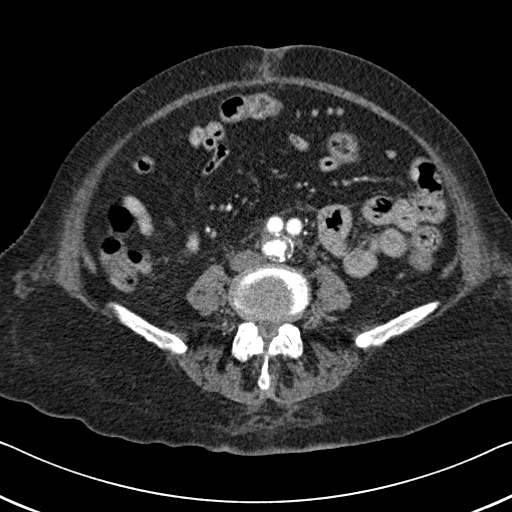
[im 107/176  soft-tissue]
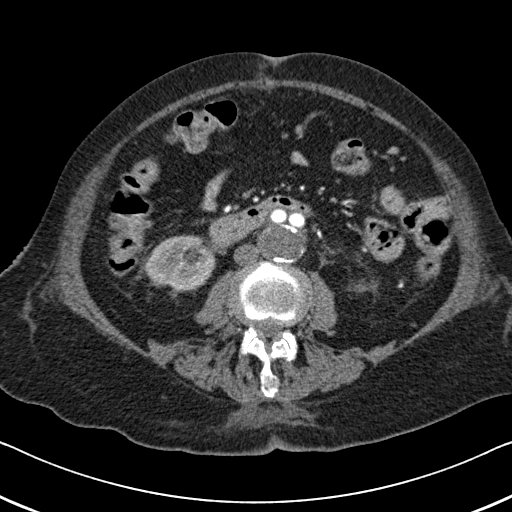
[im 122/176  soft-tissue]
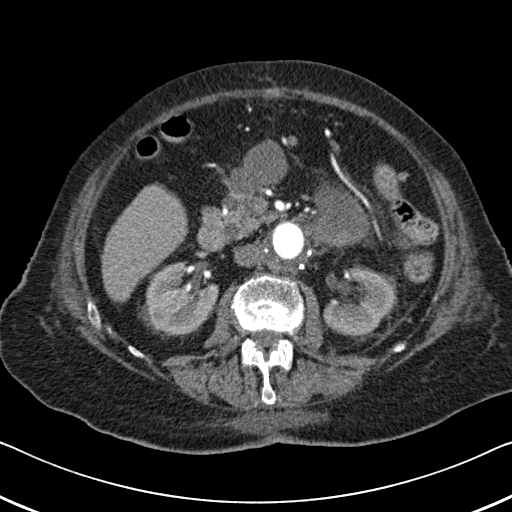
[im 122/176  bone]
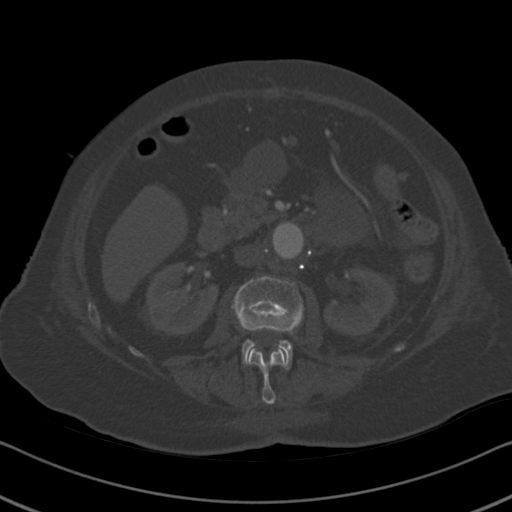
[im 137/176  soft-tissue]
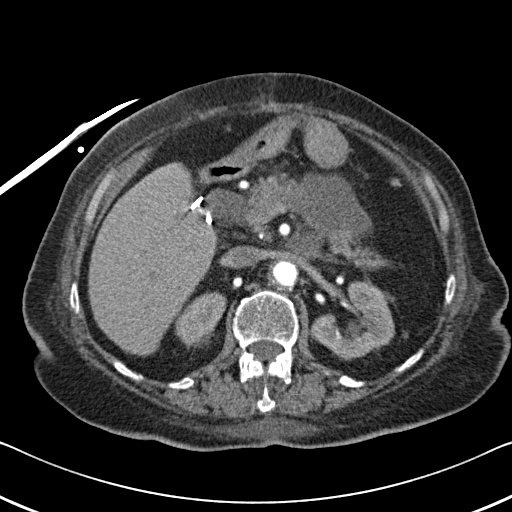
[im 153/176  soft-tissue]
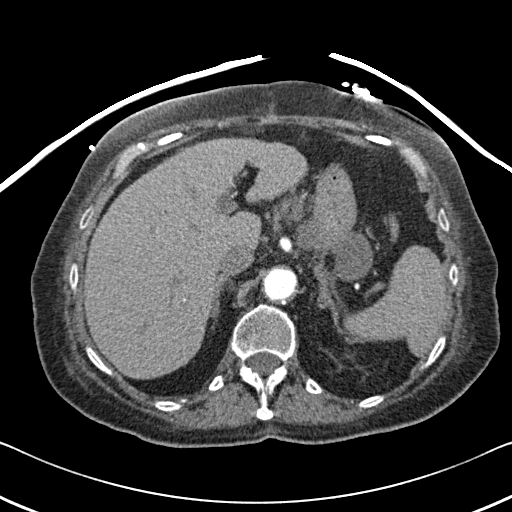
[im 168/176  soft-tissue]
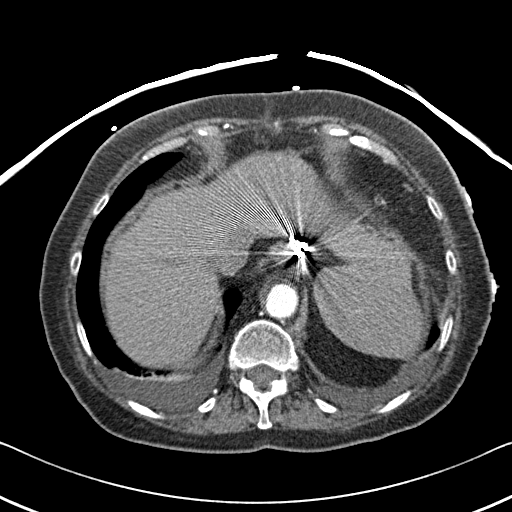

[Series 602: <mpr thick range> · coronal · 0.69mm/px · 3 of 106 slices shown]
[im 27/106  soft-tissue]
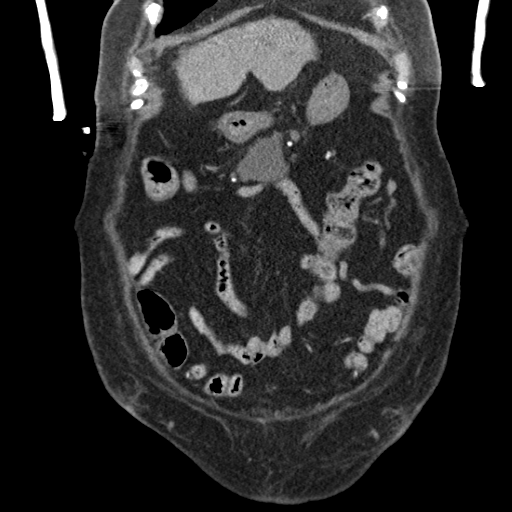
[im 53/106  soft-tissue]
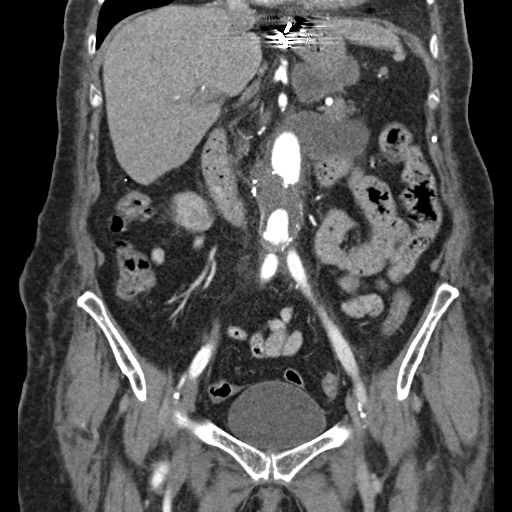
[im 79/106  soft-tissue]
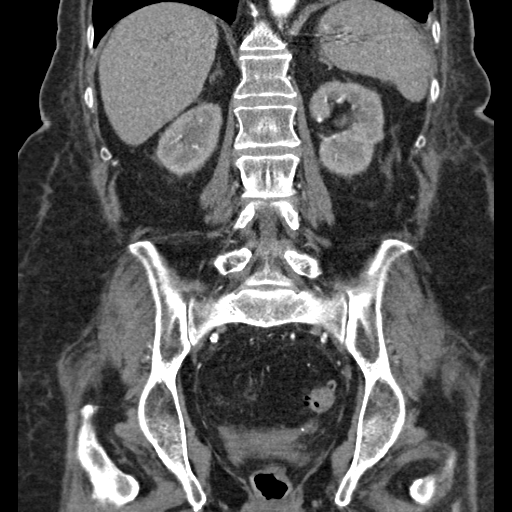

[15 of 46 positions shown; findings below may reference images not displayed]

FINDINGS: Moderate bilateral pleural effusions are present.  There
is associated atelectasis.  Heart size is normal.  The lung bases
are otherwise clear.

The liver and spleen are within normal limits.  The stomach and
duodenum are normal.  The pancreas is somewhat atrophic.  The
common bile duct is enlarged, measuring 10 mm following
cholecystectomy.  This may be within normal limits.  The adrenal
glands are normal bilaterally.  The kidneys are somewhat atrophic.
No focal mass lesion is present.

The CTA images demonstrate an aorto femoral graft.  The mesenteric
arteries are within normal limits.  There is some narrowing of the
renal arteries bilaterally.  More distal stenosis is evident on the
left.

A 5.2 x 5.2 cm low density collection extends from the left side of
the proximal anastomosis and extends to the undersurface of the
pancreas.  There are two separate low density fluid collections
within the lesser sac as well.  These do not clearly communicate
with the larger collection although they may communicate with one
another.  These collections measure 2.2 x 2.8 and 3.2 x 2.4 cm
respectively.

The graft is patent and appears to be otherwise within normal
limits throughout its course.  There is no evidence for contrast
extending to the bowel.

There is contrast within the more distal aneurysm sac, beginning
approximately 5 cm below the level the proximal anastomosis.
Contrast can be followed into the iliac arteries bilaterally. A
right groin fluid collection measures 2.8 x 2.0 cm, compatible with
a small seroma.  There is scar tissue and some inflammatory change
in the left groin but no focal fluid collection.

Minimal free fluid is noted within the anatomic pelvis.
Diverticular changes are present in the sigmoid colon without focal
inflammation to suggest diverticulitis.  The remainder of the colon
is within normal limits.  The appendix is not visualized and may be
surgically absent.  The distal small bowel is unremarkable.
Patient is status post hysterectomy.  The ovaries are not
visualized and likely surgically absent.  The urinary bladder is
within normal limits.

The bone windows demonstrate a focal bone island in the left sacral
ala.  No other focal lytic or blastic lesions are present.
Degenerative changes noted at the L4-5 and L5-S1 facet joints.

 Review of the MIP images confirms the above findings.
IMPRESSION: 1.  Status post aorto bifemoral graft.
2.  A large fluid collection extends from the proximal anastomosis
of the graft anteriorly and to the left to the level of the
pancreas.  This likely represents a seroma.  The if the patient has
had pancreatitis, pancreatic pseudocyst is also considered, but
less likely.  The collection does not appear to be infected.
3.  Two additional smaller collections are present in the lesser
sac.  These may be postoperative as well.  Pancreatic pseudocysts
are considered.
4.  Moderate bilateral pleural effusions.
5.  Contrast material  within the native aneurysm sac distally.  I
favor a proximal source for this contrast but to it is unclear.  I
do not think this is contrast extending in a retrograde fashion
from the distal anastomoses.
6.  Right groin fluid collection compatible with a seroma.
7.  Sigmoid diverticulosis without diverticulitis.
8.  Moderate spondylosis of the lower lumbar spine.

CTA PELVIS
FINDINGS: Please note the combined report above.

 Review of the MIP images confirms the above findings.
IMPRESSION: Please note the combined report above.

These findings were discussed with Dr. Jim at [DATE] p.m.
01/19/2010.

## 2011-02-27 ENCOUNTER — Other Ambulatory Visit: Payer: Self-pay | Admitting: Orthopedic Surgery

## 2011-02-27 ENCOUNTER — Encounter (HOSPITAL_COMMUNITY): Payer: Self-pay

## 2011-03-11 ENCOUNTER — Ambulatory Visit (HOSPITAL_COMMUNITY)
Admission: RE | Admit: 2011-03-11 | Discharge: 2011-03-11 | Disposition: A | Discharge status: Home / Self Care | Payer: Medicare Other | Source: Ambulatory Visit | Attending: Orthopedic Surgery | Admitting: Orthopedic Surgery

## 2011-03-11 ENCOUNTER — Encounter (HOSPITAL_COMMUNITY): Payer: Self-pay

## 2011-03-11 ENCOUNTER — Encounter (HOSPITAL_COMMUNITY)
Admission: RE | Admit: 2011-03-11 | Discharge: 2011-03-11 | Disposition: A | Discharge status: Home / Self Care | Payer: Medicare Other | Source: Ambulatory Visit | Attending: Orthopedic Surgery | Admitting: Orthopedic Surgery

## 2011-03-11 DIAGNOSIS — Z01818 Encounter for other preprocedural examination: Secondary | ICD-10-CM | POA: Insufficient documentation

## 2011-03-11 DIAGNOSIS — Z01812 Encounter for preprocedural laboratory examination: Secondary | ICD-10-CM | POA: Insufficient documentation

## 2011-03-11 LAB — TYPE AND SCREEN: Antibody Screen: NEGATIVE

## 2011-03-11 LAB — COMPREHENSIVE METABOLIC PANEL
ALT: 10 U/L (ref 0–35)
BUN: 16 mg/dL (ref 6–23)
CO2: 32 mEq/L (ref 19–32)
Calcium: 9.3 mg/dL (ref 8.4–10.5)
GFR calc Af Amer: 39 mL/min — ABNORMAL LOW (ref >90)
GFR calc non Af Amer: 34 mL/min — ABNORMAL LOW (ref >90)
Glucose, Bld: 82 mg/dL (ref 70–99)
Sodium: 140 mEq/L (ref 135–145)
Total Protein: 7.4 g/dL (ref 6.0–8.3)

## 2011-03-11 LAB — DIFFERENTIAL
Basophils Absolute: 0 10*3/uL (ref 0.0–0.1)
Eosinophils Absolute: 0.2 10*3/uL (ref 0.0–0.7)
Eosinophils Relative: 2 % (ref 0–5)
Lymphocytes Relative: 30 % (ref 12–46)
Lymphs Abs: 3.5 10*3/uL (ref 0.7–4.0)
Neutrophils Relative %: 58 % (ref 43–77)

## 2011-03-11 LAB — URINALYSIS, ROUTINE W REFLEX MICROSCOPIC
Hgb urine dipstick: NEGATIVE
Nitrite: NEGATIVE
Protein, ur: NEGATIVE mg/dL
Urobilinogen, UA: 1 mg/dL (ref 0.0–1.0)

## 2011-03-11 LAB — CBC
HCT: 43.8 % (ref 36.0–46.0)
Hemoglobin: 14.2 g/dL (ref 12.0–15.0)
MCH: 30.6 pg (ref 26.0–34.0)
MCHC: 32.4 g/dL (ref 30.0–36.0)
MCV: 94.4 fL (ref 78.0–100.0)
RBC: 4.64 MIL/uL (ref 3.87–5.11)

## 2011-03-11 LAB — SURGICAL PCR SCREEN: MRSA, PCR: NEGATIVE

## 2011-03-11 LAB — PROTIME-INR
INR: 0.92 (ref 0.00–1.49)
Prothrombin Time: 12.6 seconds (ref 11.6–15.2)

## 2011-03-11 MED ORDER — POVIDONE-IODINE 7.5 % EX SOLN: Freq: Once | CUTANEOUS | Status: DC

## 2011-03-11 NOTE — Pre-Procedure Instructions (Signed)
[place]  03/11/2011   Your procedure is scheduled on: 03-13-2011 @10 :40 AM  Report to [facility] at 8:40 AM.  Call this number if you have problems the morning of surgery: [phone]   Remember:   Do not eat food:After Midnight.  May have clear liquids: up to 4 Hours before arrival.  Clear liquids include soda, tea, black coffee, apple or grape juice, broth. 4:40 AM  Take these medicines the morning of surgery with A SIP OF WATER albuterol inhaler if needed, amiodarone,hydrocodone if needed,metoprolol,protonix   Do not wear jewelry, make-up or nail polish.  Do not wear lotions, powders, or perfumes. You may wear deodorant.  Do not shave 48 hours prior to surgery.  Do not bring valuables to the hospital.  Contacts, dentures or bridgework may not be worn into surgery.  Leave suitcase in the car. After surgery it may be brought to your room.  For patients admitted to the hospital, checkout time is 11:00 AM the day of discharge.   Marland Kitchen    Special Instructions: Incentive Spirometry - Practice and bring it with you on the day of surgery. and CHG Shower Use Special Wash: 1/2 bottle night before surgery and 1/2 bottle morning of surgery.   Please read over the following fact sheets that you were given: Pain Booklet, Blood Transfusion Information, MRSA Information and Surgical Site Infection Prevention

## 2011-03-11 NOTE — Pre-Procedure Instructions (Signed)
[place]  03/11/2011   Your procedure is scheduled on:03-13-2011 A 10:40  Report to [facility] at 8:40 AM.  Call this number if you have problems the morning of surgery: [phone]   Remember:   Do not eat food:After Midnight.  May have clear liquids: up to 4 Hours before arrival.  Clear liquids include soda, tea, black coffee, apple or grape juice, broth.  Take these medicines the morning of surgery with A SIP OF WATER: until 6:40AM   Do not wear jewelry, make-up or nail polish.  Do not wear lotions, powders, or perfumes. You may wear deodorant.  Do not shave 48 hours prior to surgery.  Do not bring valuables to the hospital.  Contacts, dentures or bridgework may not be worn into surgery.  Leave suitcase in the car. After surgery it may be brought to your room.  For patients admitted to the hospital, checkout time is 11:00 AM the day of discharge.    Special Instructions: Incentive Spirometry - Practice and bring it with you on the day of surgery. and CHG Shower Use Special Wash: 1/2 bottle night before surgery and 1/2 bottle morning of surgery.   Please read over the following fact sheets that you were given: Blood Transfusion Information, MRSA Information and Surgical Site Infection Prevention

## 2011-03-12 MED ORDER — CEFAZOLIN SODIUM-DEXTROSE 2-3 GM-% IV SOLR
2.0000 g | INTRAVENOUS | Status: AC
  Administered 2011-03-13: 2 g via INTRAVENOUS
  Filled 2011-03-12: qty 50

## 2011-03-12 MED ORDER — CHLORHEXIDINE GLUCONATE 4 % EX LIQD: 60.0000 mL | Freq: Once | CUTANEOUS | Status: DC

## 2011-03-13 ENCOUNTER — Encounter (HOSPITAL_COMMUNITY): Payer: Self-pay | Admitting: Anesthesiology

## 2011-03-13 ENCOUNTER — Encounter (HOSPITAL_COMMUNITY): Payer: Self-pay

## 2011-03-13 ENCOUNTER — Inpatient Hospital Stay (HOSPITAL_COMMUNITY): Payer: Medicare Other

## 2011-03-13 ENCOUNTER — Inpatient Hospital Stay (HOSPITAL_COMMUNITY)
Admission: RE | Admit: 2011-03-13 | Discharge: 2011-03-15 | DRG: 490 | Disposition: A | Discharge status: Home / Self Care | Payer: Medicare Other | Source: Ambulatory Visit | Attending: Orthopedic Surgery | Admitting: Orthopedic Surgery

## 2011-03-13 ENCOUNTER — Inpatient Hospital Stay (HOSPITAL_COMMUNITY): Payer: Medicare Other | Admitting: Anesthesiology

## 2011-03-13 ENCOUNTER — Encounter (HOSPITAL_COMMUNITY)
Admission: RE | Disposition: A | Discharge status: Home / Self Care | Payer: Self-pay | Source: Ambulatory Visit | Attending: Orthopedic Surgery

## 2011-03-13 ENCOUNTER — Encounter (HOSPITAL_COMMUNITY): Payer: Self-pay | Admitting: *Deleted

## 2011-03-13 DIAGNOSIS — Z9889 Other specified postprocedural states: Secondary | ICD-10-CM

## 2011-03-13 DIAGNOSIS — J449 Chronic obstructive pulmonary disease, unspecified: Secondary | ICD-10-CM | POA: Diagnosis present

## 2011-03-13 DIAGNOSIS — I509 Heart failure, unspecified: Secondary | ICD-10-CM | POA: Diagnosis present

## 2011-03-13 DIAGNOSIS — M48062 Spinal stenosis, lumbar region with neurogenic claudication: Principal | ICD-10-CM | POA: Diagnosis present

## 2011-03-13 DIAGNOSIS — J4489 Other specified chronic obstructive pulmonary disease: Secondary | ICD-10-CM | POA: Diagnosis present

## 2011-03-13 DIAGNOSIS — Z23 Encounter for immunization: Secondary | ICD-10-CM

## 2011-03-13 DIAGNOSIS — I739 Peripheral vascular disease, unspecified: Secondary | ICD-10-CM | POA: Diagnosis present

## 2011-03-13 DIAGNOSIS — I5032 Chronic diastolic (congestive) heart failure: Secondary | ICD-10-CM | POA: Diagnosis present

## 2011-03-13 DIAGNOSIS — I4891 Unspecified atrial fibrillation: Secondary | ICD-10-CM | POA: Diagnosis present

## 2011-03-13 DIAGNOSIS — Z87891 Personal history of nicotine dependence: Secondary | ICD-10-CM

## 2011-03-13 DIAGNOSIS — I10 Essential (primary) hypertension: Secondary | ICD-10-CM | POA: Diagnosis present

## 2011-03-13 DIAGNOSIS — Z882 Allergy status to sulfonamides status: Secondary | ICD-10-CM

## 2011-03-13 HISTORY — PX: LUMBAR LAMINECTOMY: SHX95

## 2011-03-13 SURGERY — MICRODISCECTOMY LUMBAR LAMINECTOMY
Anesthesia: General | Site: Spine Lumbar | Laterality: Bilateral | Wound class: Clean

## 2011-03-13 MED ORDER — ROCURONIUM BROMIDE 100 MG/10ML IV SOLN
INTRAVENOUS | Status: DC | PRN
  Administered 2011-03-13: 35 mg via INTRAVENOUS

## 2011-03-13 MED ORDER — CEFAZOLIN SODIUM 1-5 GM-% IV SOLN
1.0000 g | Freq: Three times a day (TID) | INTRAVENOUS | Status: AC
  Administered 2011-03-14: 1 g via INTRAVENOUS
  Filled 2011-03-13 (×2): qty 50

## 2011-03-13 MED ORDER — ONDANSETRON HCL 4 MG/2ML IJ SOLN
INTRAMUSCULAR | Status: DC | PRN
  Administered 2011-03-13: 4 mg via INTRAVENOUS

## 2011-03-13 MED ORDER — HYDROCHLOROTHIAZIDE 12.5 MG PO CAPS
12.5000 mg | ORAL_CAPSULE | Freq: Every day | ORAL | Status: DC
  Administered 2011-03-14: 12.5 mg via ORAL
  Filled 2011-03-13 (×3): qty 1

## 2011-03-13 MED ORDER — LACTATED RINGERS IV SOLN
INTRAVENOUS | Status: DC | PRN
  Administered 2011-03-13 (×2): via INTRAVENOUS

## 2011-03-13 MED ORDER — DIAZEPAM 5 MG PO TABS: 5.0000 mg | ORAL_TABLET | Freq: Four times a day (QID) | ORAL | Status: DC | PRN

## 2011-03-13 MED ORDER — PANTOPRAZOLE SODIUM 40 MG PO TBEC
40.0000 mg | DELAYED_RELEASE_TABLET | Freq: Two times a day (BID) | ORAL | Status: DC
  Administered 2011-03-13 – 2011-03-15 (×3): 40 mg via ORAL
  Filled 2011-03-13 (×3): qty 1

## 2011-03-13 MED ORDER — POTASSIUM CHLORIDE IN NACL 20-0.9 MEQ/L-% IV SOLN
INTRAVENOUS | Status: DC
  Administered 2011-03-13: 80 mL/h via INTRAVENOUS
  Administered 2011-03-14: 12:00:00 via INTRAVENOUS
  Administered 2011-03-15: 80 mL/h via INTRAVENOUS
  Filled 2011-03-13 (×5): qty 1000

## 2011-03-13 MED ORDER — BUPIVACAINE-EPINEPHRINE 0.25% -1:200000 IJ SOLN
INTRAMUSCULAR | Status: DC | PRN
  Administered 2011-03-13: 10 mL

## 2011-03-13 MED ORDER — ZOLPIDEM TARTRATE 5 MG PO TABS: 5.0000 mg | ORAL_TABLET | Freq: Every evening | ORAL | Status: DC | PRN

## 2011-03-13 MED ORDER — PROMETHAZINE HCL 25 MG/ML IJ SOLN: 6.2500 mg | INTRAMUSCULAR | Status: DC | PRN

## 2011-03-13 MED ORDER — SODIUM CHLORIDE 0.9 % IV SOLN: 250.0000 mL | INTRAVENOUS | Status: DC

## 2011-03-13 MED ORDER — HYDROXYZINE HCL 50 MG/ML IM SOLN
50.0000 mg | INTRAMUSCULAR | Status: DC | PRN
  Filled 2011-03-13: qty 1

## 2011-03-13 MED ORDER — THROMBIN 5000 UNITS EX SOLR
CUTANEOUS | Status: DC | PRN
  Administered 2011-03-13 (×2): 5000 [IU] via TOPICAL

## 2011-03-13 MED ORDER — HEMOSTATIC AGENTS (NO CHARGE) OPTIME
TOPICAL | Status: DC | PRN
  Administered 2011-03-13 (×2): 1 via TOPICAL

## 2011-03-13 MED ORDER — AMIODARONE HCL 100 MG PO TABS
100.0000 mg | ORAL_TABLET | Freq: Two times a day (BID) | ORAL | Status: DC
  Administered 2011-03-13 – 2011-03-14 (×3): 100 mg via ORAL
  Filled 2011-03-13 (×5): qty 1

## 2011-03-13 MED ORDER — ALBUTEROL SULFATE HFA 108 (90 BASE) MCG/ACT IN AERS
2.0000 | INHALATION_SPRAY | Freq: Four times a day (QID) | RESPIRATORY_TRACT | Status: DC | PRN
  Filled 2011-03-13: qty 6.7

## 2011-03-13 MED ORDER — HYDROMORPHONE HCL PF 1 MG/ML IJ SOLN
INTRAMUSCULAR | Status: AC
  Administered 2011-03-13: 0.5 mg via INTRAVENOUS
  Filled 2011-03-13: qty 1

## 2011-03-13 MED ORDER — OXYCODONE-ACETAMINOPHEN 5-325 MG PO TABS
1.0000 | ORAL_TABLET | ORAL | Status: DC | PRN
  Administered 2011-03-13 – 2011-03-15 (×5): 1 via ORAL
  Filled 2011-03-13 (×5): qty 1

## 2011-03-13 MED ORDER — SUFENTANIL CITRATE 50 MCG/ML IV SOLN
INTRAVENOUS | Status: DC | PRN
  Administered 2011-03-13: 5 ug via INTRAVENOUS
  Administered 2011-03-13: 15 ug via INTRAVENOUS

## 2011-03-13 MED ORDER — EPHEDRINE SULFATE 50 MG/ML IJ SOLN
INTRAMUSCULAR | Status: DC | PRN
  Administered 2011-03-13 (×2): 5 mg via INTRAVENOUS
  Administered 2011-03-13: 10 mg via INTRAVENOUS

## 2011-03-13 MED ORDER — MEPERIDINE HCL 25 MG/ML IJ SOLN: 6.2500 mg | INTRAMUSCULAR | Status: DC | PRN

## 2011-03-13 MED ORDER — METHYLPREDNISOLONE ACETATE 40 MG/ML IJ SUSP
INTRAMUSCULAR | Status: DC | PRN
  Administered 2011-03-13: 40 mg via INTRA_ARTICULAR

## 2011-03-13 MED ORDER — FERROUS SULFATE 325 (65 FE) MG PO TABS
325.0000 mg | ORAL_TABLET | Freq: Three times a day (TID) | ORAL | Status: DC
  Administered 2011-03-14 – 2011-03-15 (×4): 325 mg via ORAL
  Filled 2011-03-13 (×8): qty 1

## 2011-03-13 MED ORDER — MENTHOL 3 MG MT LOZG: 1.0000 | LOZENGE | OROMUCOSAL | Status: DC | PRN

## 2011-03-13 MED ORDER — SODIUM CHLORIDE 0.9 % IJ SOLN: 3.0000 mL | Freq: Two times a day (BID) | INTRAMUSCULAR | Status: DC

## 2011-03-13 MED ORDER — HYDROMORPHONE HCL PF 1 MG/ML IJ SOLN
0.2500 mg | INTRAMUSCULAR | Status: DC | PRN
  Administered 2011-03-13 (×2): 0.5 mg via INTRAVENOUS

## 2011-03-13 MED ORDER — PNEUMOCOCCAL VAC POLYVALENT 25 MCG/0.5ML IJ INJ
0.5000 mL | INJECTION | INTRAMUSCULAR | Status: AC
  Administered 2011-03-14: 0.5 mL via INTRAMUSCULAR
  Filled 2011-03-13: qty 0.5

## 2011-03-13 MED ORDER — PROPOFOL 10 MG/ML IV EMUL
INTRAVENOUS | Status: DC | PRN
  Administered 2011-03-13: 90 mg via INTRAVENOUS

## 2011-03-13 MED ORDER — MORPHINE SULFATE 4 MG/ML IJ SOLN: 2.0000 mg | INTRAMUSCULAR | Status: DC | PRN

## 2011-03-13 MED ORDER — HYDROXYZINE HCL 50 MG PO TABS
50.0000 mg | ORAL_TABLET | ORAL | Status: DC | PRN
  Filled 2011-03-13: qty 1

## 2011-03-13 MED ORDER — DILTIAZEM HCL ER COATED BEADS 180 MG PO CP24
180.0000 mg | ORAL_CAPSULE | Freq: Every day | ORAL | Status: DC
  Administered 2011-03-14: 180 mg via ORAL
  Filled 2011-03-13 (×3): qty 1

## 2011-03-13 MED ORDER — DEXTROSE 5 % IV SOLN
INTRAVENOUS | Status: DC | PRN
  Administered 2011-03-13: 12:00:00 via INTRAVENOUS

## 2011-03-13 MED ORDER — PHENOL 1.4 % MT LIQD: 1.0000 | OROMUCOSAL | Status: DC | PRN

## 2011-03-13 MED ORDER — MIDAZOLAM HCL 5 MG/5ML IJ SOLN
INTRAMUSCULAR | Status: DC | PRN
  Administered 2011-03-13: 1 mg via INTRAVENOUS

## 2011-03-13 MED ORDER — ACETAMINOPHEN 325 MG PO TABS: 650.0000 mg | ORAL_TABLET | ORAL | Status: DC | PRN

## 2011-03-13 MED ORDER — ACETAMINOPHEN 650 MG RE SUPP: 650.0000 mg | RECTAL | Status: DC | PRN

## 2011-03-13 MED ORDER — 0.9 % SODIUM CHLORIDE (POUR BTL) OPTIME
TOPICAL | Status: DC | PRN
  Administered 2011-03-13: 2000 mL

## 2011-03-13 MED ORDER — INDIGOTINDISULFONATE SODIUM 8 MG/ML IJ SOLN
INTRAMUSCULAR | Status: DC | PRN
  Administered 2011-03-13: 1 mL

## 2011-03-13 MED ORDER — SODIUM CHLORIDE 0.9 % IJ SOLN: 3.0000 mL | INTRAMUSCULAR | Status: DC | PRN

## 2011-03-13 MED ORDER — METOPROLOL TARTRATE 12.5 MG HALF TABLET
12.5000 mg | ORAL_TABLET | Freq: Two times a day (BID) | ORAL | Status: DC
  Administered 2011-03-13 – 2011-03-14 (×3): 12.5 mg via ORAL
  Filled 2011-03-13 (×5): qty 1

## 2011-03-13 SURGICAL SUPPLY — 72 items
APL SKNCLS STERI-STRIP NONHPOA (GAUZE/BANDAGES/DRESSINGS) ×1
BENZOIN TINCTURE PRP APPL 2/3 (GAUZE/BANDAGES/DRESSINGS) ×1 IMPLANT
BUR ROUND PRECISION 4.0 (BURR) ×2 IMPLANT
CANISTER SUCTION 2500CC (MISCELLANEOUS) ×1 IMPLANT
CLOSURE STERI STRIP 1/2 X4 (GAUZE/BANDAGES/DRESSINGS) ×1 IMPLANT
CLOTH BEACON ORANGE TIMEOUT ST (SAFETY) ×2 IMPLANT
CONT SPEC STER OR (MISCELLANEOUS) ×3 IMPLANT
CORDS BIPOLAR (ELECTRODE) ×2 IMPLANT
COVER SURGICAL LIGHT HANDLE (MISCELLANEOUS) ×2 IMPLANT
DRAIN CHANNEL 10F 3/8 F FF (DRAIN) IMPLANT
DRAPE C-ARM 42X72 X-RAY (DRAPES) IMPLANT
DRAPE POUCH INSTRU U-SHP 10X18 (DRAPES) ×3 IMPLANT
DRAPE SURG 17X23 STRL (DRAPES) ×8 IMPLANT
DURAPREP 26ML APPLICATOR (WOUND CARE) ×2 IMPLANT
ELECT BLADE 4.0 EZ CLEAN MEGAD (MISCELLANEOUS) ×2
ELECT BLADE 6.5 EXT (BLADE) ×1 IMPLANT
ELECT CAUTERY BLADE 6.4 (BLADE) ×2 IMPLANT
ELECT REM PT RETURN 9FT ADLT (ELECTROSURGICAL) ×2
ELECTRODE BLDE 4.0 EZ CLN MEGD (MISCELLANEOUS) IMPLANT
ELECTRODE REM PT RTRN 9FT ADLT (ELECTROSURGICAL) ×1 IMPLANT
EVACUATOR SILICONE 100CC (DRAIN) IMPLANT
FILTER STRAW FLUID ASPIR (MISCELLANEOUS) ×2 IMPLANT
GAUZE SPONGE 4X4 16PLY XRAY LF (GAUZE/BANDAGES/DRESSINGS) ×2 IMPLANT
GLOVE BIO SURGEON STRL SZ8 (GLOVE) ×2 IMPLANT
GLOVE BIO SURGEON STRL SZ8.5 (GLOVE) ×1 IMPLANT
GLOVE BIOGEL PI IND STRL 6.5 (GLOVE) IMPLANT
GLOVE BIOGEL PI IND STRL 8 (GLOVE) ×1 IMPLANT
GLOVE BIOGEL PI INDICATOR 6.5 (GLOVE) ×1
GLOVE BIOGEL PI INDICATOR 8 (GLOVE) ×1
GLOVE SS BIOGEL STRL SZ 6.5 (GLOVE) IMPLANT
GLOVE SUPERSENSE BIOGEL SZ 6.5 (GLOVE) ×1
GLOVE SURG SS PI 8.5 STRL IVOR (GLOVE) ×1
GLOVE SURG SS PI 8.5 STRL STRW (GLOVE) IMPLANT
GOWN STRL NON-REIN LRG LVL3 (GOWN DISPOSABLE) ×4 IMPLANT
IV CATH 14GX2 1/4 (CATHETERS) ×2 IMPLANT
KIT BASIN OR (CUSTOM PROCEDURE TRAY) ×2 IMPLANT
KIT ROOM TURNOVER OR (KITS) ×2 IMPLANT
NDL 18GX1X1/2 (RX/OR ONLY) (NEEDLE) ×1 IMPLANT
NDL HYPO 25GX1X1/2 BEV (NEEDLE) ×1 IMPLANT
NDL SPNL 18GX3.5 QUINCKE PK (NEEDLE) ×2 IMPLANT
NDL SPNL 22GX3.5 QUINCKE BK (NEEDLE) IMPLANT
NEEDLE 18GX1X1/2 (RX/OR ONLY) (NEEDLE) ×2 IMPLANT
NEEDLE HYPO 25GX1X1/2 BEV (NEEDLE) ×2 IMPLANT
NEEDLE SPNL 18GX3.5 QUINCKE PK (NEEDLE) ×4 IMPLANT
NEEDLE SPNL 22GX3.5 QUINCKE BK (NEEDLE) IMPLANT
NS IRRIG 1000ML POUR BTL (IV SOLUTION) ×2 IMPLANT
PACK LAMINECTOMY ORTHO (CUSTOM PROCEDURE TRAY) ×2 IMPLANT
PACK UNIVERSAL I (CUSTOM PROCEDURE TRAY) ×2 IMPLANT
PAD ARMBOARD 7.5X6 YLW CONV (MISCELLANEOUS) ×4 IMPLANT
PATTIES SURGICAL .5 X.5 (GAUZE/BANDAGES/DRESSINGS) IMPLANT
PATTIES SURGICAL .5 X1 (DISPOSABLE) ×2 IMPLANT
PATTIES SURGICAL 1X1 (DISPOSABLE) IMPLANT
SPONGE GAUZE 4X4 12PLY (GAUZE/BANDAGES/DRESSINGS) ×1 IMPLANT
STRIP CLOSURE SKIN 1/2X4 (GAUZE/BANDAGES/DRESSINGS) IMPLANT
SURGIFLO TRUKIT (HEMOSTASIS) ×1 IMPLANT
SUT ETHILON 3 0 FSL (SUTURE) IMPLANT
SUT MNCRL AB 3-0 PS2 27 (SUTURE) IMPLANT
SUT VIC AB 0 CT1 27 (SUTURE)
SUT VIC AB 0 CT1 27XBRD ANBCTR (SUTURE) IMPLANT
SUT VIC AB 0 CT2 27 (SUTURE) ×2 IMPLANT
SUT VIC AB 1 CT1 18XCR BRD 8 (SUTURE) ×1 IMPLANT
SUT VIC AB 1 CT1 8-18 (SUTURE) ×2
SUT VIC AB 2-0 CT2 18 VCP726D (SUTURE) ×2 IMPLANT
SYR 20CC LL (SYRINGE) IMPLANT
SYR BULB IRRIGATION 50ML (SYRINGE) ×2 IMPLANT
SYR CONTROL 10ML LL (SYRINGE) ×2 IMPLANT
SYR TB 1ML 26GX3/8 SAFETY (SYRINGE) ×3 IMPLANT
TAPE CLOTH SURG 4X10 WHT LF (GAUZE/BANDAGES/DRESSINGS) ×1 IMPLANT
TOWEL OR 17X24 6PK STRL BLUE (TOWEL DISPOSABLE) ×2 IMPLANT
TOWEL OR 17X26 10 PK STRL BLUE (TOWEL DISPOSABLE) ×2 IMPLANT
WATER STERILE IRR 1000ML POUR (IV SOLUTION) ×1 IMPLANT
YANKAUER SUCT BULB TIP NO VENT (SUCTIONS) ×3 IMPLANT

## 2011-03-13 NOTE — Anesthesia Preprocedure Evaluation (Addendum)
Anesthesia Evaluation  Patient identified by MRN, date of birth, ID band Patient awake    Reviewed: Allergy & Precautions, H&P , NPO status , Patient's Chart, lab work & pertinent test results  History of Anesthesia Complications Negative for: history of anesthetic complications  Airway Mallampati: II  Neck ROM: Full  Mouth opening: Limited Mouth Opening  Dental  (+) Edentulous Upper and Edentulous Lower   Pulmonary shortness of breath, COPD clear to auscultation        Cardiovascular hypertension, Pt. on medications + dysrhythmias Regular Normal Hx of Cardiomyopathy, stable c no recent issues.   Neuro/Psych    GI/Hepatic negative GI ROS, Neg liver ROS,   Endo/Other  Negative Endocrine ROS  Renal/GU negative Renal ROS     Musculoskeletal   Abdominal   Peds  Hematology negative hematology ROS (+)   Anesthesia Other Findings   Reproductive/Obstetrics                          Anesthesia Physical Anesthesia Plan  ASA: III  Anesthesia Plan: General   Post-op Pain Management:    Induction: Intravenous  Airway Management Planned: Oral ETT  Additional Equipment:   Intra-op Plan:   Post-operative Plan: Extubation in OR  Informed Consent: I have reviewed the patients History and Physical, chart, labs and discussed the procedure including the risks, benefits and alternatives for the proposed anesthesia with the patient or authorized representative who has indicated his/her understanding and acceptance.   Dental advisory given  Plan Discussed with: CRNA, Surgeon and Anesthesiologist  Anesthesia Plan Comments:        Anesthesia Quick Evaluation

## 2011-03-13 NOTE — H&P (Signed)
PREOPERATIVE H&P  Chief Complaint: R > L leg pain  HPI: Brenda [name] is a 78 y.o. female who presents with spinal stenosis  Past Medical History  Diagnosis Date  . HYPERTENSION   . CAROTID ARTERY DISEASE   . COPD     quit smoking 10/11  . GASTRITIS     with GI bleeding  . Edema   . Allergic rhinitis   . PVD (peripheral vascular disease)     s/p aortobifemoral bypass 12/08/09  . Chronic diastolic heart failure due to hypertrophic obstructive cardiomyopathy   . Atrial fibrillation     persistent atrial fibrillation, prior GI bleeding, presently not anticoagulated   Past Surgical History  Procedure Date  . Total abdominal hysterectomy   . Cholecystectomy   . Gastrectomy   . Rotator cuff repair   . Parotid gland tumors removed   . Aortobifemoral bypass 12/08/09   . Appendectomy   . Eye surgery     bilateral cataract removal   History   Social History  . Marital Status: Widowed    Spouse Name: N/A    Number of Children: N/A  . Years of Education: N/A   Social History Main Topics  . Smoking status: Former Smoker    Quit date: 12/21/2009  . Smokeless tobacco: None   Comment: quit  . Alcohol Use: No  . Drug Use: No  . Sexually Active: None   Other Topics Concern  . None   Social History Narrative   Pt lives in [place] with her son.  She is retired from [employer] information centre manager.  She smokes 1/2 pack a day (55+ pkyr), she quit 10/11   Family History  Problem Relation Age of Onset  . Coronary artery disease Mother   . Coronary artery disease Sister   . Stroke Father    Allergies  Allergen Reactions  . Nsaids Other (See Comments)    Sick   . Sulfonamide Derivatives Other (See Comments)    Sick    Prior to Admission medications   Medication Sig Start Date End Date Taking? Authorizing Provider  amiodarone (PACERONE) 200 MG tablet Take 100 mg by mouth 2 (two) times daily.  09/16/10  Yes Coralyn Emrie Gayle, MD  diltiazem (CARDIZEM CD) 180 MG 24 hr capsule Take 1 capsule  (180 mg total) by mouth daily. 08/02/10 08/02/11 Yes Coralyn Evora Schechter, MD  FERROUS SULFATE PO Take 1 tablet by mouth daily.     Yes Historical Provider, MD  hydrochlorothiazide (,MICROZIDE/HYDRODIURIL,) 12.5 MG capsule Take 1 capsule (12.5 mg total) by mouth daily. 09/18/10 09/18/11 Yes Coralyn Angus Amini, MD  HYDROcodone-acetaminophen (LORTAB) 7.5-500 MG per tablet Take 1 tablet by mouth daily.     Yes Historical Provider, MD  metoprolol tartrate (LOPRESSOR) 25 MG tablet Take 12.5 mg by mouth 2 (two) times daily. 1/2 tablet twice daily  07/22/10  Yes Coralyn Jaydence Vanyo, MD  pantoprazole (PROTONIX) 40 MG tablet Take 40 mg by mouth 2 (two) times daily.     Yes Historical Provider, MD  albuterol (PROVENTIL HFA;VENTOLIN HFA) 108 (90 BASE) MCG/ACT inhaler Inhale 2 puffs into the lungs every 6 (six) hours as needed. For shortness of breath.     Historical Provider, MD     All other systems have been reviewed and were otherwise negative with the exception of those mentioned in the HPI and as above.  Physical Exam: There were no vitals filed for this visit.  General: Alert, no acute distress Cardiovascular: No pedal edema Respiratory:  No cyanosis, no use of accessory musculature GI: No organomegaly, abdomen is soft and non-tender Skin: No lesions in the area of chief complaint Neurologic: Sensation intact distally Psychiatric: Patient is competent for consent with normal mood and affect Lymphatic: No axillary or cervical lymphadenopathy  MUSCULOSKELETAL: weakness to bilateral EHL and DF  Assessment/Plan: Bilatral leg pain from spinal stenosis Plan for Procedure(s): L3-S1 decompression   Brenda [name] 03/13/2011 11:01 AM

## 2011-03-13 NOTE — Anesthesia Postprocedure Evaluation (Signed)
  Anesthesia Post-op Note  Patient: Brenda [name]  Procedure(s) Performed:  MICRODISCECTOMY LUMBAR LAMINECTOMY - Lumbar 3-4, lumbar 4-5, lumbar 5-sacrum 1 decompression.  Patient Location: PACU  Anesthesia Type: General  Level of Consciousness: awake  Airway and Oxygen Therapy: Patient Spontanous Breathing  Post-op Pain: mild  Post-op Assessment: Post-op Vital signs reviewed  Post-op Vital Signs: stable  Complications: No apparent anesthesia complications

## 2011-03-13 NOTE — Plan of Care (Signed)
Problem: Consults Goal: Diagnosis - Spinal Surgery Microdiscectomy

## 2011-03-13 NOTE — Transfer of Care (Signed)
Immediate Anesthesia Transfer of Care Note  Patient: Brenda [name]  Procedure(s) Performed:  MICRODISCECTOMY LUMBAR LAMINECTOMY - Lumbar 3-4, lumbar 4-5, lumbar 5-sacrum 1 decompression.  Patient Location: PACU  Anesthesia Type: General  Level of Consciousness: awake and alert   Airway & Oxygen Therapy: Patient Spontanous Breathing and Patient connected to nasal cannula oxygen  Post-op Assessment: Report given to PACU RN, Post -op Vital signs reviewed and stable and Patient moving all extremities  Post vital signs: Reviewed and stable There were no vitals filed for this visit.  Complications: No apparent anesthesia complications

## 2011-03-14 ENCOUNTER — Encounter (HOSPITAL_COMMUNITY): Payer: Self-pay | Admitting: Orthopedic Surgery

## 2011-03-14 MED ORDER — OXYCODONE-ACETAMINOPHEN 5-325 MG PO TABS: 1.0000 | ORAL_TABLET | ORAL | Status: AC | PRN

## 2011-03-14 MED ORDER — DIAZEPAM 5 MG PO TABS: 5.0000 mg | ORAL_TABLET | Freq: Four times a day (QID) | ORAL | Status: AC | PRN

## 2011-03-14 NOTE — Op Note (Signed)
NAMESHERIAN, BIGNESS NO.:  [id number]  MEDICAL RECORD NO.:  [id number]  LOCATION:  5007                         FACILITY:  [facility]  PHYSICIAN:  Phylliss Bob, MD      DATE OF BIRTH:  08/18/1931  DATE OF PROCEDURE:  03/13/2011 DATE OF DISCHARGE:                              OPERATIVE REPORT   PREOPERATIVE DIAGNOSES: 1. Spinal stenosis L3-4, L4-5, L5-S1. 2. Neurogenic claudication.  POSTOPERATIVE DIAGNOSES: 1. Spinal stenosis L3-4, L4-5, L5-S1. 2. Neurogenic claudication.  PROCEDURE:  L3-4, L4-5, L5-S1 laminectomy, partial facetectomy, and bilateral foraminotomy.  SURGEON:  Phylliss Bob, MD  ASSISTANT:  None.  ANESTHESIA:  General endotracheal anesthesia.  COMPLICATIONS:  None.  DISPOSITION:  Stable.  ESTIMATED BLOOD LOSS:  Minimal.  INDICATIONS FOR PROCEDURE:  Briefly, Ms. Slagle is a very pleasant, 78-year-old female who presented to my office on February 04, 2011.  The patient was previously diagnosed with spinal stenosis.  The patient was very much against going forward with epidural injections.  She estimates that her pain has been present for approximately 5 years.  Of note, the patient states that her pain essentially involved the bilateral buttocks and the anterolateral aspect of her thighs bilaterally.  I did review an MRI which was notable for 3 levels of spinal stenosis.  I did have a rather extensive discussion with the patient regarding the fact that I did feel that one additional option regarding nonoperative measures was to go forward with epidural injections.  She did have physical therapy, but did not have any improvement.  However, the patient was very much against any form of injections and did wish instead to go forward directly with the surgical decompression.  I go on to explain to the patient that one of the advantages of epidural injections are diagnostic, but again, the patient refused to go forward with injections and  therefore we did have a discussion regarding going forward with a three-level bilateral decompression.  The patient fully understood the risks and limitations of the procedure, as outlined in my preoperative note.  OPERATIVE DETAILS:  On March 13, 2011, the patient was brought to surgery and general endotracheal anesthesia was administered.  The patient was placed prone on a well-padded Wilson frame.  All bony prominences were meticulously padded.  The back was prepped and draped in the usual sterile fashion.  I then made an incision from approximately spinous process of L2 to approximately spinous process of S1.  The fascia was incised at the midline and the paraspinal musculature was bluntly swept laterally.  I then obtained a lateral fluoroscopic view with a curette located beneath the L4 lamina.  This did confirm the appropriate operative levels.  I then removed the spinous processes of L3, L4, and L5.  I then started the L5-S1 interspace and used a series of Kerrison punches to perform a central decompression across the lower 3 interspaces.  I then continued to use a Kerrison meticulously and went forward with a thorough lateral recess decompression on both the right and left sides.  The neural foramina were also decompressed using a #2 Kerrison.  Of note, there was no undue trauma to the dura or the  traversing or exiting nerves throughout the procedure.  There was no extravasation of cerebrospinal fluid.  At the termination of the decompression, I was easily able to pass a nerve hook out the neural foramina, associated with the L3-4, L4-5, and L5-S1 levels.  All epidural bleeding was controlled using bipolar electrocautery, in addition to FloSeal and patties.  I then copiously irrigated the wound.  I did infiltrate 40 mg of Depo-Medrol about the epidural space.  I then closed the fascia using #1 Vicryl.  The subcutaneous layer was then closed using 2-0 Vicryl and the skin  was closed using 4-0 Monocryl.  All instrument counts were correct at the termination of the procedure.     Phylliss Bob, MD     MD/MEDQ  D:  03/13/2011  T:  03/14/2011  Job:  [id number]  cc:   Ardyth Gal. Spruill, M.D. Fax: [phone]

## 2011-03-14 NOTE — Progress Notes (Signed)
Pt feels well, + LBP  AVSS Dressing CDI NVI  POD #1 after L3-S1 decompression  - up with PT today - likely d/c home tomorrow - f/u 2 weeks

## 2011-03-14 NOTE — Progress Notes (Signed)
UR COMPLETED  

## 2011-03-14 NOTE — Progress Notes (Signed)
Physical Therapy Evaluation Patient Details Name: Brenda [name] MRN: [id number] DOB: Jan 16, 1932 Today's Date: 03/14/2011  Problem List:  Patient Active Problem List  Diagnoses  . TOBACCO ABUSE  . HYPERTENSION  . ATRIAL FIBRILLATION  . CAROTID ARTERY DISEASE  . PVD  . COPD  . GASTRITIS  . EDEMA  . SHORTNESS OF BREATH  . Bradycardia    Past Medical History:  Past Medical History  Diagnosis Date  . HYPERTENSION   . CAROTID ARTERY DISEASE   . COPD     quit smoking 10/11  . GASTRITIS     with GI bleeding  . Edema   . Allergic rhinitis   . PVD (peripheral vascular disease)     s/p aortobifemoral bypass 12/08/09  . Chronic diastolic heart failure due to hypertrophic obstructive cardiomyopathy   . Atrial fibrillation     persistent atrial fibrillation, prior GI bleeding, presently not anticoagulated   Past Surgical History:  Past Surgical History  Procedure Date  . Total abdominal hysterectomy   . Cholecystectomy   . Gastrectomy   . Rotator cuff repair   . Parotid gland tumors removed   . Aortobifemoral bypass 12/08/09   . Appendectomy   . Eye surgery     bilateral cataract removal  . Lumbar laminectomy 03/13/2011    Procedure: MICRODISCECTOMY LUMBAR LAMINECTOMY;  Surgeon: Lawrence Santiago [facility];  Location: [place];  Service: Orthopedics;  Laterality: Bilateral;  Lumbar 3-4, lumbar 4-5, lumbar 5-sacrum 1 decompression.    PT Assessment/Plan/Recommendation PT Assessment Clinical Impression Statement: Pt presents with a medical diagnosis of microdiscectomy of multiple levels(L3-S1). Pt will benefit from skilled PT in the acute care setting in order to maximize functional mobility for a safe dc home PT Recommendation/Assessment: Patient will need skilled PT in the acute care venue PT Problem List: Decreased activity tolerance;Decreased mobility;Decreased knowledge of use of DME;Decreased knowledge of precautions;Pain PT Therapy Diagnosis : Acute pain PT Plan PT Frequency:  Min 5X/week PT Treatment/Interventions: DME instruction;Gait training;Stair training;Functional mobility training;Therapeutic activities;Therapeutic exercise;Patient/family education PT Recommendation Follow Up Recommendations: No PT follow up Equipment Recommended: None recommended by PT PT Goals  Acute Rehab PT Goals PT Goal Formulation: With patient Time For Goal Achievement: 7 days Pt will go Supine/Side to Sit: with modified independence PT Goal: Supine/Side to Sit - Progress: Progressing toward goal Pt will go Sit to Stand: with modified independence PT Goal: Sit to Stand - Progress: Progressing toward goal Pt will go Stand to Sit: with modified independence PT Goal: Stand to Sit - Progress: Progressing toward goal Pt will Transfer Bed to Chair/Chair to Bed: with modified independence PT Transfer Goal: Bed to Chair/Chair to Bed - Progress: Progressing toward goal Pt will Ambulate: >150 feet;with supervision;with rolling walker PT Goal: Ambulate - Progress: Progressing toward goal Pt will Go Up / Down Stairs: 1-2 stairs;with supervision;with rail(s) (1 rail) PT Goal: Up/Down Stairs - Progress: Progressing toward goal Pt will Perform Home Exercise Program: Independently PT Goal: Perform Home Exercise Program - Progress: Not met  PT Evaluation Precautions/Restrictions    Prior Functioning  Home Living Lives With: Son;Family Receives Help From: Family Type of Home: House Home Layout: One level Home Access: Stairs to enter Entrance Stairs-Rails: Right Entrance Stairs-Number of Steps: 2 Bathroom Shower/Tub: Walk-in shower;Door [employer] Toilet: Standard Bathroom Accessibility: Yes How Accessible: Accessible via walker Home Adaptive Equipment: Walker - rolling;Straight cane;Shower chair with back Prior Function Level of Independence: Needs assistance with homemaking;Independent with gait (daughter in law does all housework; ambulates with  cane) Able to Take Stairs?:  Yes Driving: No Vocation: Retired Artist: Awake/alert Overall Cognitive Status: Appears within functional limits for tasks assessed Orientation Level: Oriented X4 Sensation/Coordination Sensation Light Touch: Appears Intact Extremity Assessment RLE Assessment RLE Assessment: Within Functional Limits LLE Assessment LLE Assessment: Within Functional Limits Mobility (including Balance) Bed Mobility Bed Mobility: Yes Rolling Left: 5: Supervision Rolling Left Details (indicate cue type and reason): VC for proper rolling in order to maintain back precautions Left Sidelying to Sit: 4: Min assist;HOB elevated (comment degrees);With rails (30) Left Sidelying to Sit Details (indicate cue type and reason): VC for proper sequencing. Assisted pt with trunk control into sitting Sitting - Scoot to Edge of Bed: 6: Modified independent (Device/Increase time) Transfers Transfers: Yes Sit to Stand: 4: Min assist;With upper extremity assist;From bed Sit to Stand Details (indicate cue type and reason): Assist for standing to maintain back precautions without bending. Pt with increased assistance of UEs Stand to Sit: 5: Supervision;To chair/3-in-1 Stand to Sit Details: VC for hand placement and back precautions while sitting. Pt able to control descent into chair Ambulation/Gait Ambulation/Gait: Yes Ambulation/Gait Assistance: 4: Min assist (Minguard assist) Ambulation/Gait Assistance Details (indicate cue type and reason): VC for safety with RW with ambulation Ambulation Distance (Feet): 200 Feet Assistive device: Rolling walker Gait Pattern: Step-to pattern;Decreased stride length Gait velocity: Decreased gait speed Stairs: Yes Stairs Assistance: 5: Supervision Stairs Assistance Details (indicate cue type and reason): VC for proper stairs sequencing. Pt required no physical assist. Supervision for safety Stair Management Technique: Two rails;Forwards Number of  Stairs: 2     Exercise    End of Session PT - End of Session Equipment Utilized During Treatment: Gait belt Activity Tolerance: Patient tolerated treatment well Patient left: in chair;with call bell in reach Nurse Communication: Mobility status for transfers;Mobility status for ambulation General Behavior During Session: [facility] for tasks performed Cognition: [facility] for tasks performed  Ambrose Finland 03/14/2011, 1:34 PM  03/14/2011 Ambrose Finland DPT PAGER: [phone] OFFICE: [phone]

## 2011-03-14 NOTE — Progress Notes (Signed)
OT Screen  OT order received. Chart reviewed. Discussed OT with pt. Pt received assistance with all ADL by family PTA. Pt will continue to have assistance after D/C and is not interested in becoming more independent. No OT needs. OT signing off. Thanks for referral. Maurie Boettcher, OTR/L  [id number] 03/14/2011

## 2011-03-15 NOTE — Progress Notes (Signed)
Physical Therapy Treatment Patient Details Name: NASHIYA GREYNOLDS MRN: [id number] DOB: [birth year] Today's Date: 03/15/2011  PT Assessment/Plan  PT - Assessment/Plan Comments on Treatment Session: Pt able to verbalize all precautions but requires some cueing to recall with activity. PT Plan: Discharge plan remains appropriate PT Frequency: Min 5X/week Follow Up Recommendations: No PT follow up Equipment Recommended: None recommended by PT PT Goals  Acute Rehab PT Goals PT Goal: Supine/Side to Sit - Progress: Progressing toward goal PT Goal: Sit to Stand - Progress: Progressing toward goal PT Goal: Stand to Sit - Progress: Progressing toward goal PT Goal: Ambulate - Progress: Progressing toward goal PT Goal: Up/Down Stairs - Progress: Met  PT Treatment Precautions/Restrictions  Precautions Precautions: Back Mobility (including Balance) Bed Mobility Rolling Left: 5: Supervision Rolling Left Details (indicate cue type and reason): Cues for safe log roll technique Left Sidelying to Sit: 5: Supervision;HOB flat Left Sidelying to Sit Details (indicate cue type and reason): Cues for safe techinque Sitting - Scoot to Edge of Bed: 6: Modified independent (Device/Increase time) Transfers Sit to Stand: 5: Supervision;From bed Sit to Stand Details (indicate cue type and reason): Cues for safe hand placement Stand to Sit: 5: Supervision;To chair/3-in-1 Stand to Sit Details: Cues for safe hand placement Ambulation/Gait Ambulation/Gait Assistance: 5: Supervision Ambulation/Gait Assistance Details (indicate cue type and reason): cues for avoiding obstacles in hallway with use of RW Ambulation Distance (Feet): 200 Feet Assistive device: Rolling walker Gait Pattern: Step-to pattern Gait velocity: decreased Stairs: Yes Stairs Assistance: 5: Supervision Stair Management Technique: Two rails Number of Stairs: 2     Exercise    End of Session PT - End of Session Equipment Utilized During  Treatment: Gait belt Activity Tolerance: Patient tolerated treatment well Patient left: in chair Nurse Communication: Mobility status for transfers;Mobility status for ambulation General Behavior During Session: [facility] for tasks performed Cognition: [facility] for tasks performed  Robinette, Tonia Brooms 03/15/2011, 10:06 AM 03/15/2011 Jacqualyn Posey PTA [phone] pager [phone] office

## 2011-03-15 NOTE — Progress Notes (Signed)
Subjective: 2 Days Post-Op Procedure(s) (LRB): MICRODISCECTOMY LUMBAR LAMINECTOMY (Bilateral)  Activity level:  oob having no leg pain and happy Diet tolerance:  eating Voiding  ok Patient reports pain as 1 on 0-10 scale.    Objective: Vital signs in last 24 hours: Temp:  [97.6 F (36.4 C)-98.8 F (37.1 C)] 97.8 F (36.6 C) (01/12 0602) Pulse Rate:  [59-80] 59  (01/12 0602) Resp:  [18-20] 20  (01/12 0602) BP: (99-118)/(51-76) 99/58 mmHg (01/12 0602) SpO2:  [91 %-95 %] 93 % (01/12 0602)  Intake/Output from previous day: 01/11 0701 - 01/12 0700 In: 1862 [P.O.:200; I.V.:1662] Out: -  Intake/Output this shift:    No results found for this basename: HGB:5 in the last 72 hours No results found for this basename: WBC:2,RBC:2,HCT:2,PLT:2 in the last 72 hours No results found for this basename: NA:2,K:2,CL:2,CO2:2,BUN:2,CREATININE:2,GLUCOSE:2,CALCIUM:2 in the last 72 hours No results found for this basename: LABPT:2,INR:2 in the last 72 hours  PHYSICAL EXAM:  Neurologically intact ABD soft Neurovascular intact Sensation intact distally Intact pulses distally  Good motor function le  Assessment/Plan: 2 Days Post-Op Procedure(s) (LRB): MICRODISCECTOMY LUMBAR LAMINECTOMY (Bilateral) Advance diet Up with therapy   D/c home today  Brenda [name] R 03/15/2011, 8:10 AM

## 2011-03-19 NOTE — Discharge Summary (Signed)
NAMENAHLAH, [name] NO.:  [id number]  MEDICAL RECORD NO.:  [id number]  LOCATION:  5007                         FACILITY:  [place]  PHYSICIAN:  Phylliss Bob, MD      DATE OF BIRTH:  1931/05/21  DATE OF ADMISSION:  03/13/2011 DATE OF DISCHARGE:  03/15/2011                              DISCHARGE SUMMARY   ADMISSION DIAGNOSES: 1. L3-S1 spinal stenosis 2. Neurogenic claudication.  DISCHARGE DIAGNOSES: 1. L3-S1 spinal stenosis. 2. Neurogenic claudication.  ADMISSION HISTORY:  Ms. Brenda [name] is a very pleasant 78 year old female who presented to my office on February 04, 2011 with severe and debilitating pain in her bilateral legs.  I did review an MRI which was notable for 3 levels of spinal stenosis.  Given the patient's failure of conservative care, the patient was admitted on March 13, 2011 for decompressive procedure.  HOSPITAL COURSE:  On March 13, 2011, the patient was brought to surgery and underwent a 3-level decompression.  The patient tolerated the procedure well and was transferred to recovery in stable condition. The patient was progressively mobilized throughout her hospital stay. On the morning of postop day #2, it was felt the patient was safe for discharge home.  DISCHARGE INSTRUCTIONS:  The patient will take Percocet for pain and Valium for spasms.  She will adhere to back precautions at all times. The patient was instructed on signs and symptoms associated with infection.  Again, she will follow up in my office in approximately 2 weeks after her procedure.     Phylliss Bob, MD     MD/MEDQ  D:  03/19/2011  T:  03/19/2011  Job:  [id number]  cc:   Ardyth Gal. Spruill, M.D.

## 2011-06-06 ENCOUNTER — Encounter: Payer: Self-pay | Admitting: Surgery

## 2011-06-09 ENCOUNTER — Encounter: Payer: Self-pay | Admitting: Surgery

## 2011-06-09 ENCOUNTER — Ambulatory Visit (INDEPENDENT_AMBULATORY_CARE_PROVIDER_SITE_OTHER): Payer: Medicare Other | Admitting: Surgery

## 2011-06-09 ENCOUNTER — Ambulatory Visit (INDEPENDENT_AMBULATORY_CARE_PROVIDER_SITE_OTHER): Payer: Medicare Other | Admitting: *Deleted

## 2011-06-09 VITALS — BP 128/64 | HR 49 | Temp 97.9°F | Resp 16 | Ht 60.0 in | Wt 134.0 lb

## 2011-06-09 DIAGNOSIS — Z48812 Encounter for surgical aftercare following surgery on the circulatory system: Secondary | ICD-10-CM

## 2011-06-09 DIAGNOSIS — I739 Peripheral vascular disease, unspecified: Secondary | ICD-10-CM

## 2011-06-09 NOTE — Progress Notes (Signed)
[facility] [place]   Patient name: Brenda [name] MRN: [id number] DOB: 1931/09/20 Sex: female     Chief Complaint  Patient presents with  . PVD    follow up w/labs, Dr. Lynann Bologna planning spine surgery soon.    HISTORY OF PRESENT ILLNESS: The patient comes back today for followup. She is status post aortobifemoral bypass graft using a 14 x 7 dacryon graft. This was done in October of 2011 in the setting of bilateral claudication. She is back today with complaints of pins and needles in both of her thighs. She has a history of back surgery but has been reassured this is not coming from her back. She denies ulceration or claudication like symptoms however, she does state that her legs will get a week on occasion.  Past Medical History  Diagnosis Date  . HYPERTENSION   . CAROTID ARTERY DISEASE   . COPD     quit smoking 10/11  . GASTRITIS     with GI bleeding  . Edema   . Allergic rhinitis   . PVD (peripheral vascular disease)     s/p aortobifemoral bypass 12/08/09  . Chronic diastolic heart failure due to hypertrophic obstructive cardiomyopathy   . Atrial fibrillation     persistent atrial fibrillation, prior GI bleeding, presently not anticoagulated    Past Surgical History  Procedure Date  . Total abdominal hysterectomy   . Cholecystectomy   . Gastrectomy   . Rotator cuff repair   . Parotid gland tumors removed   . Aortobifemoral bypass 12/08/09   . Appendectomy   . Eye surgery     bilateral cataract removal  . Lumbar laminectomy 03/13/2011    Procedure: MICRODISCECTOMY LUMBAR LAMINECTOMY;  Surgeon: Lawrence Santiago [facility];  Location: [place];  Service: Orthopedics;  Laterality: Bilateral;  Lumbar 3-4, lumbar 4-5, lumbar 5-sacrum 1 decompression.    History   Social History  . Marital Status: Widowed    Spouse Name: N/A    Number of Children: N/A  . Years of Education: N/A   Occupational History  . Not on file.   Social History Main Topics  .  Smoking status: Former Smoker    Quit date: 12/21/2009  . Smokeless tobacco: Not on file   Comment: quit  . Alcohol Use: No  . Drug Use: No  . Sexually Active: Not on file   Other Topics Concern  . Not on file   Social History Narrative   Pt lives in [place] with her son.  She is retired from Tourist information centre manager.  She smokes 1/2 pack a day (55+ pkyr), she quit 10/11    Family History  Problem Relation Age of Onset  . Coronary artery disease Mother   . Coronary artery disease Sister   . Stroke Father     Allergies as of 06/09/2011 - Review Complete 06/09/2011  Allergen Reaction Noted  . Nsaids Other (See Comments)   . Sulfonamide derivatives Other (See Comments)     Current Outpatient Prescriptions on File Prior to Visit  Medication Sig Dispense Refill  . albuterol (PROVENTIL HFA;VENTOLIN HFA) 108 (90 BASE) MCG/ACT inhaler Inhale 2 puffs into the lungs every 6 (six) hours as needed. For shortness of breath.       . diltiazem (CARDIZEM CD) 180 MG 24 hr capsule Take 1 capsule (180 mg total) by mouth daily.  30 capsule  11  . FERROUS SULFATE PO Take 1 tablet by mouth daily.        Marland Kitchen  hydrochlorothiazide (,MICROZIDE/HYDRODIURIL,) 12.5 MG capsule Take 1 capsule (12.5 mg total) by mouth daily.  30 capsule  11  . metoprolol tartrate (LOPRESSOR) 25 MG tablet Take 12.5 mg by mouth 2 (two) times daily. 1/2 tablet twice daily       . pantoprazole (PROTONIX) 40 MG tablet Take 40 mg by mouth 2 (two) times daily.        Marland Kitchen amiodarone (PACERONE) 200 MG tablet Take 100 mg by mouth 2 (two) times daily.   30 tablet  11     REVIEW OF SYSTEMS: Positive for shortness of breath with exertion, pain in her legs with walking, weakness in her legs, numbness in her legs, dizziness. All other review of systems are negative PHYSICAL EXAMINATION:   Vital signs are BP 128/64  Pulse 49  Temp(Src) 97.9 F (36.6 C) (Oral)  Resp 16  Ht 5' (1.524 m)  Wt 134 lb (60.782 kg)  BMI 26.17 kg/m2  SpO2 98% General:  The patient appears their stated age. HEENT:  No gross abnormalities Pulmonary:  Non labored breathing Abdomen: Soft and non-tender incision is well-healed. No evidence of hernia Musculoskeletal: There are no major deformities. Neurologic: No focal weakness or paresthesias are detected, Skin: There are no ulcer or rashes noted. Psychiatric: The patient has normal affect. Cardiovascular: There is a regular rate and rhythm without significant murmur appreciated. Palpable pedal pulses bilaterally   Diagnostic Studies ABIs are normal. 0.95 on the right 0.96 the left, both with triphasic waveforms  Assessment: Status post aortobifemoral bypass graft Plan: I do not believe her symptoms are arterial in origin. If they are not coming from her back down not sure what the etiology is. I will give her a trial of Neurontin to see if this helps alleviate some of her problems. If not, she may benefit from a formal evaluation by a neurologist   V. Leia Alf, M.D. [facility] [place] Office: [phone] Pager:  [phone]

## 2011-06-13 ENCOUNTER — Other Ambulatory Visit: Payer: Self-pay

## 2011-06-13 ENCOUNTER — Telehealth: Payer: Self-pay

## 2011-06-13 DIAGNOSIS — R29898 Other symptoms and signs involving the musculoskeletal system: Secondary | ICD-10-CM

## 2011-06-13 DIAGNOSIS — R202 Paresthesia of skin: Secondary | ICD-10-CM

## 2011-06-13 NOTE — Telephone Encounter (Addendum)
See note below re: reaction to Neurontin.  Referral will be made to Neurology per order Dr. Trula Slade. Pt. Saw Dr. Trula Slade on 4/8 and tried to take Neurontin for c/o pins and needles in thighs and weakness in legs.  Was not able to tolerate Neurontin.

## 2011-06-13 NOTE — Telephone Encounter (Signed)
Message copied by Denman George on Fri Jun 13, 2011  9:10 AM ------      Message from: Nicki Reaper      Created: Thu Jun 12, 2011  9:00 PM      Regarding: RE: Re: reaction to medication       I told her that if this did not help, she would need a referal to a neurologist.  We should set that up for her      ----- Message -----         From: Sherrye Payor, RN         Sent: 06/12/2011   4:00 PM           To: Serafina Mitchell, MD      Subject: Re: reaction to medication                               You saw this pt. 4/8.  She was a f/u Aortobifem BPG. She c/o weakness/numbness in legs and you prescribed Neurontin.  Pt. Called to report that she had 2 episodes of falling associated with taking dose of Neurontin.  "Ataxia" is listed as a side effect.  She has stopped the medication, and I told her I would let you know.  Any further recommendations?

## 2011-07-01 ENCOUNTER — Other Ambulatory Visit: Payer: Self-pay | Admitting: Neurology

## 2011-07-01 DIAGNOSIS — R269 Unspecified abnormalities of gait and mobility: Secondary | ICD-10-CM

## 2011-07-01 DIAGNOSIS — I739 Peripheral vascular disease, unspecified: Secondary | ICD-10-CM

## 2011-07-01 DIAGNOSIS — M545 Low back pain: Secondary | ICD-10-CM

## 2011-07-05 ENCOUNTER — Ambulatory Visit
Admission: RE | Admit: 2011-07-05 | Discharge: 2011-07-05 | Disposition: A | Discharge status: Home / Self Care | Payer: Medicare Other | Source: Ambulatory Visit | Attending: Neurology | Admitting: Neurology

## 2011-07-05 DIAGNOSIS — M545 Low back pain: Secondary | ICD-10-CM

## 2011-07-05 DIAGNOSIS — I739 Peripheral vascular disease, unspecified: Secondary | ICD-10-CM

## 2011-07-05 DIAGNOSIS — R269 Unspecified abnormalities of gait and mobility: Secondary | ICD-10-CM

## 2011-11-10 ENCOUNTER — Ambulatory Visit: Payer: Medicare Other | Admitting: Surgery

## 2011-11-10 ENCOUNTER — Other Ambulatory Visit: Payer: Medicare Other

## 2011-11-11 ENCOUNTER — Other Ambulatory Visit: Payer: Self-pay | Admitting: Cardiology

## 2012-05-05 ENCOUNTER — Other Ambulatory Visit (HOSPITAL_COMMUNITY): Payer: Self-pay | Admitting: Cardiology

## 2012-05-05 DIAGNOSIS — R079 Chest pain, unspecified: Secondary | ICD-10-CM

## 2012-05-14 ENCOUNTER — Encounter (HOSPITAL_COMMUNITY): Payer: Medicare Other

## 2012-05-24 ENCOUNTER — Encounter (HOSPITAL_COMMUNITY)
Admission: RE | Admit: 2012-05-24 | Discharge: 2012-05-24 | Disposition: A | Discharge status: Home / Self Care | Payer: Medicare Other | Source: Ambulatory Visit | Attending: Cardiology | Admitting: Cardiology

## 2012-05-24 DIAGNOSIS — I4891 Unspecified atrial fibrillation: Secondary | ICD-10-CM | POA: Insufficient documentation

## 2012-05-24 DIAGNOSIS — R079 Chest pain, unspecified: Secondary | ICD-10-CM | POA: Insufficient documentation

## 2012-05-24 DIAGNOSIS — J449 Chronic obstructive pulmonary disease, unspecified: Secondary | ICD-10-CM | POA: Insufficient documentation

## 2012-05-24 DIAGNOSIS — J4489 Other specified chronic obstructive pulmonary disease: Secondary | ICD-10-CM | POA: Insufficient documentation

## 2012-05-24 DIAGNOSIS — I10 Essential (primary) hypertension: Secondary | ICD-10-CM | POA: Insufficient documentation

## 2012-05-24 MED ORDER — REGADENOSON 0.4 MG/5ML IV SOLN
0.4000 mg | Freq: Once | INTRAVENOUS | Status: AC
  Administered 2012-05-24: 0.4 mg via INTRAVENOUS

## 2012-05-24 MED ORDER — REGADENOSON 0.4 MG/5ML IV SOLN
INTRAVENOUS | Status: AC
  Filled 2012-05-24: qty 5

## 2012-05-24 MED ORDER — TECHNETIUM TC 99M SESTAMIBI GENERIC - CARDIOLITE
30.0000 | Freq: Once | INTRAVENOUS | Status: AC | PRN
  Administered 2012-05-24: 30 via INTRAVENOUS

## 2012-05-24 MED ORDER — TECHNETIUM TC 99M SESTAMIBI GENERIC - CARDIOLITE
10.0000 | Freq: Once | INTRAVENOUS | Status: AC | PRN
  Administered 2012-05-24: 10 via INTRAVENOUS

## 2012-06-03 ENCOUNTER — Inpatient Hospital Stay (HOSPITAL_BASED_OUTPATIENT_CLINIC_OR_DEPARTMENT_OTHER)
Admission: RE | Admit: 2012-06-03 | Discharge: 2012-06-03 | Disposition: A | Discharge status: Home / Self Care | Payer: Medicare Other | Source: Ambulatory Visit | Attending: Cardiology | Admitting: Cardiology

## 2012-06-03 ENCOUNTER — Encounter (HOSPITAL_BASED_OUTPATIENT_CLINIC_OR_DEPARTMENT_OTHER)
Admission: RE | Disposition: A | Discharge status: Home / Self Care | Payer: Self-pay | Source: Ambulatory Visit | Attending: Cardiology

## 2012-06-03 DIAGNOSIS — R0989 Other specified symptoms and signs involving the circulatory and respiratory systems: Secondary | ICD-10-CM | POA: Insufficient documentation

## 2012-06-03 DIAGNOSIS — R0609 Other forms of dyspnea: Secondary | ICD-10-CM | POA: Insufficient documentation

## 2012-06-03 DIAGNOSIS — I2510 Atherosclerotic heart disease of native coronary artery without angina pectoris: Secondary | ICD-10-CM | POA: Insufficient documentation

## 2012-06-03 SURGERY — JV LEFT HEART CATHETERIZATION WITH CORONARY ANGIOGRAM

## 2012-06-03 MED ORDER — ONDANSETRON HCL 4 MG/2ML IJ SOLN: 4.0000 mg | Freq: Four times a day (QID) | INTRAMUSCULAR | Status: DC | PRN

## 2012-06-03 MED ORDER — SODIUM CHLORIDE 0.9 % IV SOLN: INTRAVENOUS | Status: DC

## 2012-06-03 MED ORDER — ACETAMINOPHEN 325 MG PO TABS: 650.0000 mg | ORAL_TABLET | ORAL | Status: DC | PRN

## 2012-06-03 NOTE — OR Nursing (Signed)
Tegaderm dressing applied, site level 0, bedrest began at 1020

## 2012-06-03 NOTE — OR Nursing (Signed)
Discharge instructions reviewed and signed, pt stated understanding, ambulated in hall without difficulty, site level 0, transported to daughter-in-law's car via wheelchair

## 2012-06-03 NOTE — H&P (Signed)
  Handwritten H&P in the chart needs to be scanned. 

## 2012-06-03 NOTE — CV Procedure (Signed)
30th report dictated on 06/03/2012 dictation number reduce [phone]

## 2012-06-03 NOTE — OR Nursing (Signed)
Meal served 

## 2012-06-04 NOTE — Cardiovascular Report (Signed)
Brenda [name], Brenda [name]              ACCOUNT NO.:  [id number]  MEDICAL RECORD NO.:  [id number]  LOCATION:                                 FACILITY:  PHYSICIAN:  Laraina Sulton N. Terrence Dupont, M.D. DATE OF BIRTH:  Nov 30, 1931  DATE OF PROCEDURE:  06/03/2012 DATE OF DISCHARGE:                           CARDIAC CATHETERIZATION   PROCEDURES:  Left cardiac catheterization with selective left and right coronary angiography, measurement of left ventricular end-diastolic pressure via right groin using Judkins technique.  INDICATION FOR THE PROCEDURE:  Brenda [name] is a 79 year old female with past medical history significant for coronary artery disease, hypertension, status post atrial fibrillation, COPD, hypothyroidism, history of peptic ulcer disease, chronic kidney disease, complains of exertional dyspnea associated with feeling weak, tired with minimal exertion, with no energy.  The patient denies any chest pain, although her activity is limited.  Denies palpitation, lightheadedness, or syncope.  Denies PND, orthopnea, or leg swelling.  The patient underwent Lexiscan Cardiolite on May 24, 2012, which showed inferolateral wall ischemia with EF of 62%.  The patient denies any rest or nocturnal angina.  Denies cough, fever, or chills due to new-onset exertional dyspnea, multiple risk factors, and positive Lexiscan Cardiolite. Discussed with the patient regarding left cardia cath, its risks and benefits, i.e., death, MI, stroke, local vascular complications, etc., and consented for the procedure.  DESCRIPTION OF PROCEDURE:  After obtaining the informed consent, the patient was brought to the cath lab and was placed on fluoroscopy table. Right groin was prepped and draped in usual fashion.  The patient was well-hydrated prior to cardiac catheterization.  Palpation seated with the help of thin wall needle, a 4-French arterial sheath was placed. The sheath was aspirated and flushed.  Next, 4-French left  Judkins catheter was advanced over the wire under fluoroscopic guidance up to the ascending aorta.  Wire was pulled out.  The catheter was aspirated and connected to the Manifold.  Catheter was further advanced and engaged into the left coronary ostium.  Multiple views of the left system were taken.  Next, catheter was disengaged and was pulled out over the wire and was replaced with 4-French.  A 3D right diagnostic catheter which was advanced over the wire under fluoroscopic guidance up to the ascending aorta.  Wire was pulled out.  The catheter was aspirated and connected to the Manifold.  Catheter was further advanced and engaged into right coronary ostium.  A single view of right coronary artery was obtained.  Next, catheter was disengaged and was pulled out over the wire and was replaced with 4-French pigtail catheter which was advanced over the wire under fluoroscopic guidance up to the ascending aorta.  Catheter was further advanced across the aortic valve into the LV.  LV pressures were recorded.  Next, the pullback pressures were recorded from LV and aorta.  There was no gradient across the aortic valve.  Next, pigtail catheter was pulled out over the wire.  Sheaths were aspirated and flushed.  FINDINGS:  LVEDP was 18.  Left main was short which was patent.  The LAD has 20% to 25% mid stenosis.  Diagonal 1 and 2 were small which were patent.  Ramus was very  small which was patent.  Left circumflex was large which has 10% to 15% distal stenosis.  OM-1 was very, very small. OM-2 was moderate size, which was patent.  OM-3 was very small which was patent.  OM-4 was small which was patent.  RCA was small nondominant which was patent.  The patient tolerated the procedure well.  There were no complications.  The patient was transferred to recovery room in stable condition.     Allegra Lai. Terrence Dupont, M.D.     MNH/MEDQ  D:  06/03/2012  T:  06/04/2012  Job:  [id number]

## 2012-06-07 ENCOUNTER — Ambulatory Visit: Payer: Medicare Other | Admitting: Surgery

## 2012-06-23 ENCOUNTER — Other Ambulatory Visit: Payer: Self-pay | Admitting: *Deleted

## 2012-06-23 DIAGNOSIS — Z48812 Encounter for surgical aftercare following surgery on the circulatory system: Secondary | ICD-10-CM

## 2012-06-23 DIAGNOSIS — I739 Peripheral vascular disease, unspecified: Secondary | ICD-10-CM

## 2012-06-28 ENCOUNTER — Ambulatory Visit: Payer: Medicare Other | Admitting: Surgery

## 2012-07-05 ENCOUNTER — Ambulatory Visit: Payer: Medicare Other | Admitting: Surgery

## 2013-03-14 ENCOUNTER — Emergency Department (HOSPITAL_COMMUNITY): Payer: Medicare Other

## 2013-03-14 ENCOUNTER — Inpatient Hospital Stay (HOSPITAL_COMMUNITY)
Admission: EM | Admit: 2013-03-14 | Discharge: 2013-03-24 | DRG: 193 | Disposition: A | Discharge status: Home / Self Care | Payer: Medicare Other | Attending: Cardiology | Admitting: Cardiology

## 2013-03-14 ENCOUNTER — Encounter (HOSPITAL_COMMUNITY): Payer: Self-pay | Admitting: Emergency Medicine

## 2013-03-14 DIAGNOSIS — J9600 Acute respiratory failure, unspecified whether with hypoxia or hypercapnia: Secondary | ICD-10-CM | POA: Diagnosis not present

## 2013-03-14 DIAGNOSIS — J4489 Other specified chronic obstructive pulmonary disease: Secondary | ICD-10-CM | POA: Diagnosis present

## 2013-03-14 DIAGNOSIS — D649 Anemia, unspecified: Secondary | ICD-10-CM | POA: Diagnosis not present

## 2013-03-14 DIAGNOSIS — K5289 Other specified noninfective gastroenteritis and colitis: Secondary | ICD-10-CM | POA: Diagnosis present

## 2013-03-14 DIAGNOSIS — I4891 Unspecified atrial fibrillation: Secondary | ICD-10-CM | POA: Diagnosis present

## 2013-03-14 DIAGNOSIS — I129 Hypertensive chronic kidney disease with stage 1 through stage 4 chronic kidney disease, or unspecified chronic kidney disease: Secondary | ICD-10-CM | POA: Diagnosis present

## 2013-03-14 DIAGNOSIS — Z8249 Family history of ischemic heart disease and other diseases of the circulatory system: Secondary | ICD-10-CM

## 2013-03-14 DIAGNOSIS — I509 Heart failure, unspecified: Secondary | ICD-10-CM | POA: Diagnosis present

## 2013-03-14 DIAGNOSIS — Z888 Allergy status to other drugs, medicaments and biological substances status: Secondary | ICD-10-CM

## 2013-03-14 DIAGNOSIS — I5043 Acute on chronic combined systolic (congestive) and diastolic (congestive) heart failure: Secondary | ICD-10-CM | POA: Diagnosis not present

## 2013-03-14 DIAGNOSIS — Z87891 Personal history of nicotine dependence: Secondary | ICD-10-CM

## 2013-03-14 DIAGNOSIS — I2510 Atherosclerotic heart disease of native coronary artery without angina pectoris: Secondary | ICD-10-CM | POA: Diagnosis present

## 2013-03-14 DIAGNOSIS — E039 Hypothyroidism, unspecified: Secondary | ICD-10-CM | POA: Diagnosis present

## 2013-03-14 DIAGNOSIS — N179 Acute kidney failure, unspecified: Secondary | ICD-10-CM | POA: Diagnosis not present

## 2013-03-14 DIAGNOSIS — G9340 Encephalopathy, unspecified: Secondary | ICD-10-CM | POA: Diagnosis not present

## 2013-03-14 DIAGNOSIS — N182 Chronic kidney disease, stage 2 (mild): Secondary | ICD-10-CM | POA: Diagnosis present

## 2013-03-14 DIAGNOSIS — Z823 Family history of stroke: Secondary | ICD-10-CM

## 2013-03-14 DIAGNOSIS — I214 Non-ST elevation (NSTEMI) myocardial infarction: Secondary | ICD-10-CM | POA: Diagnosis not present

## 2013-03-14 DIAGNOSIS — Z882 Allergy status to sulfonamides status: Secondary | ICD-10-CM

## 2013-03-14 DIAGNOSIS — J181 Lobar pneumonia, unspecified organism: Secondary | ICD-10-CM

## 2013-03-14 DIAGNOSIS — I421 Obstructive hypertrophic cardiomyopathy: Secondary | ICD-10-CM | POA: Diagnosis present

## 2013-03-14 DIAGNOSIS — J9601 Acute respiratory failure with hypoxia: Secondary | ICD-10-CM

## 2013-03-14 DIAGNOSIS — I739 Peripheral vascular disease, unspecified: Secondary | ICD-10-CM | POA: Diagnosis present

## 2013-03-14 DIAGNOSIS — J189 Pneumonia, unspecified organism: Principal | ICD-10-CM | POA: Diagnosis present

## 2013-03-14 DIAGNOSIS — J449 Chronic obstructive pulmonary disease, unspecified: Secondary | ICD-10-CM | POA: Diagnosis present

## 2013-03-14 DIAGNOSIS — Z79899 Other long term (current) drug therapy: Secondary | ICD-10-CM

## 2013-03-14 DIAGNOSIS — K219 Gastro-esophageal reflux disease without esophagitis: Secondary | ICD-10-CM | POA: Diagnosis present

## 2013-03-14 HISTORY — DX: Pure hypercholesterolemia, unspecified: E78.00

## 2013-03-14 HISTORY — DX: Pneumonia, unspecified organism: J18.9

## 2013-03-14 HISTORY — DX: Shortness of breath: R06.02

## 2013-03-14 HISTORY — DX: Personal history of other medical treatment: Z92.89

## 2013-03-14 HISTORY — DX: Unspecified osteoarthritis, unspecified site: M19.90

## 2013-03-14 LAB — COMPREHENSIVE METABOLIC PANEL
ALBUMIN: 3.2 g/dL — AB (ref 3.5–5.2)
ALBUMIN: 2.5 g/dL — AB (ref 3.5–5.2)
ALK PHOS: 78 U/L (ref 39–117)
ALT: 20 U/L (ref 0–35)
ALT: 15 U/L (ref 0–35)
AST: 23 U/L (ref 0–37)
AST: 22 U/L (ref 0–37)
Alkaline Phosphatase: 82 U/L (ref 39–117)
BUN: 60 mg/dL — AB (ref 6–23)
BUN: 54 mg/dL — AB (ref 6–23)
CALCIUM: 7.7 mg/dL — AB (ref 8.4–10.5)
CO2: 30 mEq/L (ref 19–32)
CO2: 28 mEq/L (ref 19–32)
CREATININE: 1.95 mg/dL — AB (ref 0.50–1.10)
Calcium: 9 mg/dL (ref 8.4–10.5)
Chloride: 96 mEq/L (ref 96–112)
Chloride: 97 mEq/L (ref 96–112)
Creatinine, Ser: 2.18 mg/dL — ABNORMAL HIGH (ref 0.50–1.10)
GFR calc Af Amer: 24 mL/min — ABNORMAL LOW (ref >90)
GFR calc Af Amer: 27 mL/min — ABNORMAL LOW (ref >90)
GFR calc non Af Amer: 20 mL/min — ABNORMAL LOW (ref >90)
GFR calc non Af Amer: 23 mL/min — ABNORMAL LOW (ref >90)
Glucose, Bld: 115 mg/dL — ABNORMAL HIGH (ref 70–99)
Glucose, Bld: 69 mg/dL — ABNORMAL LOW (ref 70–99)
POTASSIUM: 3.1 meq/L — AB (ref 3.7–5.3)
Potassium: 2.7 mEq/L — CL (ref 3.7–5.3)
SODIUM: 144 meq/L (ref 137–147)
Sodium: 138 mEq/L (ref 137–147)
TOTAL PROTEIN: 7.8 g/dL (ref 6.0–8.3)
Total Bilirubin: 1.8 mg/dL — ABNORMAL HIGH (ref 0.3–1.2)
Total Bilirubin: 0.9 mg/dL (ref 0.3–1.2)
Total Protein: 6.3 g/dL (ref 6.0–8.3)

## 2013-03-14 LAB — CBC WITH DIFFERENTIAL/PLATELET
BASOS PCT: 0 % (ref 0–1)
BASOS PCT: 0 % (ref 0–1)
Basophils Absolute: 0 10*3/uL (ref 0.0–0.1)
Basophils Absolute: 0 10*3/uL (ref 0.0–0.1)
EOS ABS: 0 10*3/uL (ref 0.0–0.7)
Eosinophils Absolute: 0 10*3/uL (ref 0.0–0.7)
Eosinophils Relative: 0 % (ref 0–5)
Eosinophils Relative: 0 % (ref 0–5)
HCT: 42.9 % (ref 36.0–46.0)
HCT: 36.5 % (ref 36.0–46.0)
HEMOGLOBIN: 12.1 g/dL (ref 12.0–15.0)
Hemoglobin: 14.6 g/dL (ref 12.0–15.0)
Lymphocytes Relative: 6 % — ABNORMAL LOW (ref 12–46)
Lymphocytes Relative: 10 % — ABNORMAL LOW (ref 12–46)
Lymphs Abs: 0.9 10*3/uL (ref 0.7–4.0)
Lymphs Abs: 1.2 10*3/uL (ref 0.7–4.0)
MCH: 31.3 pg (ref 26.0–34.0)
MCH: 30.6 pg (ref 26.0–34.0)
MCHC: 34 g/dL (ref 30.0–36.0)
MCHC: 33.2 g/dL (ref 30.0–36.0)
MCV: 92.1 fL (ref 78.0–100.0)
MCV: 92.2 fL (ref 78.0–100.0)
MONO ABS: 1.2 10*3/uL — AB (ref 0.1–1.0)
MONOS PCT: 7 % (ref 3–12)
MONOS PCT: 10 % (ref 3–12)
Monocytes Absolute: 1.1 10*3/uL — ABNORMAL HIGH (ref 0.1–1.0)
NEUTROS PCT: 87 % — AB (ref 43–77)
Neutro Abs: 13.2 10*3/uL — ABNORMAL HIGH (ref 1.7–7.7)
Neutro Abs: 9.4 10*3/uL — ABNORMAL HIGH (ref 1.7–7.7)
Neutrophils Relative %: 80 % — ABNORMAL HIGH (ref 43–77)
PLATELETS: 164 10*3/uL (ref 150–400)
Platelets: 129 10*3/uL — ABNORMAL LOW (ref 150–400)
RBC: 4.66 MIL/uL (ref 3.87–5.11)
RBC: 3.96 MIL/uL (ref 3.87–5.11)
RDW: 13.9 % (ref 11.5–15.5)
RDW: 13.9 % (ref 11.5–15.5)
WBC: 15.2 10*3/uL — ABNORMAL HIGH (ref 4.0–10.5)
WBC: 11.8 10*3/uL — ABNORMAL HIGH (ref 4.0–10.5)

## 2013-03-14 LAB — URINE MICROSCOPIC-ADD ON

## 2013-03-14 LAB — URINALYSIS, ROUTINE W REFLEX MICROSCOPIC
GLUCOSE, UA: NEGATIVE mg/dL
KETONES UR: 15 mg/dL — AB
Nitrite: NEGATIVE
PH: 5 (ref 5.0–8.0)
Protein, ur: 30 mg/dL — AB
SPECIFIC GRAVITY, URINE: 1.019 (ref 1.005–1.030)
Urobilinogen, UA: 1 mg/dL (ref 0.0–1.0)

## 2013-03-14 LAB — POCT I-STAT TROPONIN I: TROPONIN I, POC: 0.06 ng/mL (ref 0.00–0.08)

## 2013-03-14 LAB — INFLUENZA PANEL BY PCR (TYPE A & B)
H1N1 flu by pcr: NOT DETECTED
INFLAPCR: NEGATIVE
Influenza B By PCR: NEGATIVE

## 2013-03-14 MED ORDER — DEXTROSE 5 % IV SOLN
1.0000 g | INTRAVENOUS | Status: DC
  Administered 2013-03-16 – 2013-03-17 (×3): 1 g via INTRAVENOUS
  Filled 2013-03-14 (×5): qty 10

## 2013-03-14 MED ORDER — ATORVASTATIN CALCIUM 20 MG PO TABS
20.0000 mg | ORAL_TABLET | Freq: Every day | ORAL | Status: DC
  Administered 2013-03-15 – 2013-03-24 (×10): 20 mg via ORAL
  Filled 2013-03-14 (×10): qty 1

## 2013-03-14 MED ORDER — PANTOPRAZOLE SODIUM 40 MG PO TBEC
40.0000 mg | DELAYED_RELEASE_TABLET | Freq: Two times a day (BID) | ORAL | Status: DC
  Administered 2013-03-14 – 2013-03-24 (×20): 40 mg via ORAL
  Filled 2013-03-14 (×20): qty 1

## 2013-03-14 MED ORDER — ONDANSETRON HCL 4 MG/2ML IJ SOLN
4.0000 mg | Freq: Once | INTRAMUSCULAR | Status: AC
Start: 2013-03-14 — End: 2013-03-14
  Administered 2013-03-14: 4 mg via INTRAVENOUS
  Filled 2013-03-14: qty 2

## 2013-03-14 MED ORDER — SODIUM CHLORIDE 0.9 % IV SOLN
INTRAVENOUS | Status: DC
  Administered 2013-03-14 (×2): via INTRAVENOUS
  Administered 2013-03-15: 1000 mL via INTRAVENOUS
  Administered 2013-03-16 – 2013-03-17 (×2): via INTRAVENOUS

## 2013-03-14 MED ORDER — DEXTROSE 5 % IV SOLN
500.0000 mg | INTRAVENOUS | Status: DC
  Administered 2013-03-16 – 2013-03-17 (×3): 500 mg via INTRAVENOUS
  Filled 2013-03-14 (×5): qty 500

## 2013-03-14 MED ORDER — AMIODARONE HCL 100 MG PO TABS
100.0000 mg | ORAL_TABLET | Freq: Two times a day (BID) | ORAL | Status: DC
  Administered 2013-03-15 – 2013-03-17 (×6): 100 mg via ORAL
  Filled 2013-03-14 (×8): qty 1

## 2013-03-14 MED ORDER — METOPROLOL TARTRATE 12.5 MG HALF TABLET
12.5000 mg | ORAL_TABLET | Freq: Two times a day (BID) | ORAL | Status: DC
  Administered 2013-03-14 – 2013-03-24 (×17): 12.5 mg via ORAL
  Filled 2013-03-14 (×21): qty 1

## 2013-03-14 MED ORDER — DEXTROSE 5 % IV SOLN
500.0000 mg | Freq: Once | INTRAVENOUS | Status: AC
  Administered 2013-03-14: 500 mg via INTRAVENOUS

## 2013-03-14 MED ORDER — CEFTRIAXONE SODIUM 1 G IJ SOLR
1.0000 g | Freq: Once | INTRAMUSCULAR | Status: AC
  Administered 2013-03-14: 1 g via INTRAVENOUS
  Filled 2013-03-14: qty 10

## 2013-03-14 MED ORDER — LEVOTHYROXINE SODIUM 50 MCG PO TABS
50.0000 ug | ORAL_TABLET | Freq: Every day | ORAL | Status: DC
  Administered 2013-03-15 – 2013-03-24 (×10): 50 ug via ORAL
  Filled 2013-03-14 (×11): qty 1

## 2013-03-14 MED ORDER — SODIUM CHLORIDE 0.9 % IV BOLUS (SEPSIS)
500.0000 mL | Freq: Once | INTRAVENOUS | Status: AC
  Administered 2013-03-14: 500 mL via INTRAVENOUS

## 2013-03-14 MED ORDER — IOHEXOL 300 MG/ML  SOLN
25.0000 mL | INTRAMUSCULAR | Status: AC
  Administered 2013-03-14: 25 mL via ORAL

## 2013-03-14 NOTE — ED Notes (Signed)
Pt's son Verdene Rio Home: [phone].

## 2013-03-14 NOTE — ED Notes (Signed)
Pt reports for the last week she has been having nausea/vomiting/diarrhea. Pt reports generalized weakness and fatigue. Unable to eat or drink anything. Was incontinent this AM.

## 2013-03-14 NOTE — ED Notes (Signed)
Transporting patient to new room assignment. 

## 2013-03-14 NOTE — ED Notes (Signed)
PA resident at bedside 

## 2013-03-14 NOTE — ED Notes (Signed)
Unable to obtain urine specimen, pt states she is not able to urinate at this time. [place] PA made aware, she states no need for in/out cath.

## 2013-03-14 NOTE — H&P (Signed)
Brenda [name] is an 80 y.o. female.   Chief Complaint: Coughing associated with yellowish phlegm/abdominal pain with diarrhea and generalized malaise HPI: Patient is 80 year old female with past medical history significant for coronary artery disease hypertension, history of recurrent atrial fibrillation, COPD, hypothyroidism, history of peptic ulcer disease/GI bleed in the past, chronic kidney disease, peripheral vascular disease, history of congestive heart failure secondary to preserved LV systolic function, came to the ER complaining of vague abdominal pain associated with diarrhea cough and yellowish phlegm for 2 weeks associated with generalized malaise. Patient also complains of chills. States and flulike symptoms associated with generalized bodyache last week and has progressively gotten worse. Patient had CT of the abdomen which was negative for any acute processes but was noted to have left lower lobe infiltrate. Patient was noted to hypoxic on room air.  Past Medical History  Diagnosis Date  . HYPERTENSION   . CAROTID ARTERY DISEASE   . COPD     quit smoking 10/11  . GASTRITIS     with GI bleeding  . Edema   . Allergic rhinitis   . PVD (peripheral vascular disease)     s/p aortobifemoral bypass 12/08/09  . Chronic diastolic heart failure due to hypertrophic obstructive cardiomyopathy   . Atrial fibrillation     persistent atrial fibrillation, prior GI bleeding, presently not anticoagulated    Past Surgical History  Procedure Laterality Date  . Total abdominal hysterectomy    . Cholecystectomy    . Gastrectomy    . Rotator cuff repair    . Parotid gland tumors removed    . Aortobifemoral bypass 12/08/09    . Appendectomy    . Eye surgery      bilateral cataract removal  . Lumbar laminectomy  03/13/2011    Procedure: MICRODISCECTOMY LUMBAR LAMINECTOMY;  Surgeon: Lawrence Santiago [facility];  Location: [place];  Service: Orthopedics;  Laterality: Bilateral;  Lumbar 3-4, lumbar 4-5,  lumbar 5-sacrum 1 decompression.    Family History  Problem Relation Age of Onset  . Coronary artery disease Mother   . Coronary artery disease Sister   . Stroke Father    Social History:  reports that she quit smoking about 3 years ago. She does not have any smokeless tobacco history on file. She reports that she does not drink alcohol or use illicit drugs.  Allergies:  Allergies  Allergen Reactions  . Nsaids Other (See Comments)    Sick   . Sulfonamide Derivatives Other (See Comments)    Sick      (Not in a hospital admission)  Results for orders placed during the hospital encounter of 03/14/13 (from the past 48 hour(s))  CBC WITH DIFFERENTIAL     Status: Abnormal   Collection Time    03/14/13  1:57 PM      Result Value Range   WBC 15.2 (*) 4.0 - 10.5 K/uL   RBC 4.66  3.87 - 5.11 MIL/uL   Hemoglobin 14.6  12.0 - 15.0 g/dL   HCT 42.9  36.0 - 46.0 %   MCV 92.1  78.0 - 100.0 fL   MCH 31.3  26.0 - 34.0 pg   MCHC 34.0  30.0 - 36.0 g/dL   RDW 13.9  11.5 - 15.5 %   Platelets 164  150 - 400 K/uL   Neutrophils Relative % 87 (*) 43 - 77 %   Neutro Abs 13.2 (*) 1.7 - 7.7 K/uL   Lymphocytes Relative 6 (*) 12 - 46 %  Lymphs Abs 0.9  0.7 - 4.0 K/uL   Monocytes Relative 7  3 - 12 %   Monocytes Absolute 1.1 (*) 0.1 - 1.0 K/uL   Eosinophils Relative 0  0 - 5 %   Eosinophils Absolute 0.0  0.0 - 0.7 K/uL   Basophils Relative 0  0 - 1 %   Basophils Absolute 0.0  0.0 - 0.1 K/uL  COMPREHENSIVE METABOLIC PANEL     Status: Abnormal   Collection Time    03/14/13  1:57 PM      Result Value Range   Sodium 144  137 - 147 mEq/L   Potassium 3.1 (*) 3.7 - 5.3 mEq/L   Chloride 96  96 - 112 mEq/L   CO2 30  19 - 32 mEq/L   Glucose, Bld 115 (*) 70 - 99 mg/dL   BUN 60 (*) 6 - 23 mg/dL   Creatinine, Ser 2.18 (*) 0.50 - 1.10 mg/dL   Calcium 9.0  8.4 - 10.5 mg/dL   Total Protein 7.8  6.0 - 8.3 g/dL   Albumin 3.2 (*) 3.5 - 5.2 g/dL   AST 23  0 - 37 U/L   ALT 20  0 - 35 U/L   Alkaline  Phosphatase 78  39 - 117 U/L   Total Bilirubin 1.8 (*) 0.3 - 1.2 mg/dL   GFR calc non Af Amer 20 (*) >90 mL/min   GFR calc Af Amer 24 (*) >90 mL/min   Comment: (NOTE)     The eGFR has been calculated using the CKD EPI equation.     This calculation has not been validated in all clinical situations.     eGFR's persistently <90 mL/min signify possible Chronic Kidney     Disease.  POCT I-STAT TROPONIN I     Status: None   Collection Time    03/14/13  2:14 PM      Result Value Range   Troponin i, poc 0.06  0.00 - 0.08 ng/mL   Comment 3            Comment: Due to the release kinetics of cTnI,     a negative result within the first hours     of the onset of symptoms does not rule out     myocardial infarction with certainty.     If myocardial infarction is still suspected,     repeat the test at appropriate intervals.   Ct Abdomen Pelvis Wo Contrast  03/14/2013   CLINICAL DATA:  Nausea, vomiting and diarrhea. Generalize weakness and fatigue.  EXAM: CT ABDOMEN AND PELVIS WITHOUT CONTRAST  TECHNIQUE: Multidetector CT imaging of the abdomen and pelvis was performed following the standard protocol without intravenous contrast.  COMPARISON:  06/20/2010  FINDINGS: Patchy areas of airspace consolidation are noted in the lingula and left lower lobe. Multiple tree-in-bud nodules are identified within the right lower lobe. No focal liver abnormality identified. The patient is status post cholecystectomy. The common bile duct appears increased in caliber measuring up to 1 cm. Normal appearance of the pancreas. The spleen is unremarkable.  The right adrenal gland is within normal limits. Stable left adrenal gland nodule measures 1 x 1.5 cm, image 22/series 2. Bilateral perinephric fat stranding noted and appears chronic. There is no nephrolithiasis or hydronephrosis. The urinary bladder appears normal. Previous hysterectomy. The patient is status post bypass grafting of abdominal aortic aneurysm. No upper  abdominal adenopathy noted. There is no pelvic or inguinal adenopathy identified. No free fluid or fluid  collection within be abdomen or pelvis.  Postoperative changes from partial gastrectomy identified. The small bowel loops have a normal course and caliber. No obstruction. There is a normal caliber of the colon. Periumbilical hernia is identified. This contains fat and a portion of nonobstructed transverse colon, image 37/series 2. Multiple distal colonic diverticula are noted. No acute inflammation.  Review of the visualized osseous structures is negative for lytic or sclerotic bone lesion. There is multi level lumbar degenerative disc disease identified within the lumbar spine.  IMPRESSION: 1. Airspace opacities within the left lower lobe are identified and worrisome for pneumonia. The tree in bud nodules scattered throughout the right lower lobe are likely postinflammatory or infectious. 2. Prior cholecystectomy 3. Prior partial gastrectomy. 4. Previous bypass grafting of abdominal aortic aneurysm. 5. Sigmoid diverticulosis without acute inflammation.   Electronically Signed   By: Kerby Moors M.D.   On: 03/14/2013 17:48   Dg Chest 2 View  03/14/2013   CLINICAL DATA:  80 year old female with cough weakness shortness of breath abdominal pain nausea. Initial encounter.  EXAM: CHEST  2 VIEW  COMPARISON:  03/11/2011 and earlier.  FINDINGS: Seated AP and lateral views of the chest. Confluent left lung base opacity is new. No associated pleural effusion. Normal cardiac size and mediastinal contours. Upper lobes are stable and clear. No pneumothorax or edema. No acute osseous abnormality identified. Chronic epigastric surgical clips.  IMPRESSION: Left lower lobe pneumonia. Post treatment radiographs recommended to document resolution.   Electronically Signed   By: Lars Pinks M.D.   On: 03/14/2013 15:40    Review of Systems  Constitutional: Positive for fever, chills and malaise/fatigue.  HENT: Negative for  hearing loss.   Eyes: Negative for double vision and photophobia.  Respiratory: Positive for cough and sputum production. Negative for hemoptysis, shortness of breath and wheezing.   Cardiovascular: Negative for chest pain, palpitations and orthopnea.  Gastrointestinal: Negative for nausea, vomiting and abdominal pain.  Genitourinary: Negative for dysuria.  Neurological: Positive for weakness. Negative for dizziness and headaches.    Blood pressure 152/61, pulse 64, temperature 98.2 F (36.8 C), temperature source Oral, resp. rate 18, weight 58.968 kg (130 lb), SpO2 96.00%. Physical Exam  Constitutional: She is oriented to person, place, and time.  HENT:  Head: Normocephalic and atraumatic.  Eyes: Left eye exhibits no discharge. No scleral icterus.  Conjunctival congestion noted  Neck: Normal range of motion. Neck supple. No JVD present. No tracheal deviation present. No thyromegaly present.  Cardiovascular:  Irregularly irregular soft systolic murmur noted no S3 gallop  Respiratory:  Decreased breath sound at bases with left lung rhonchi  GI: Soft. Bowel sounds are normal. She exhibits distension. There is no tenderness. There is no rebound.  Musculoskeletal: She exhibits no edema and no tenderness.  Lymphadenopathy:    She has no cervical adenopathy.  Neurological: She is alert and oriented to person, place, and time.     Assessment/Plan Community-acquired left lower lobe pneumonia Status post viral syndrome Probable gastroenteritis Hypertension Mild CAD COPD Chronic recurrent atrial fibrillation Hypothyroidism Chronic kidney disease Peripheral vascular disease History of congestive heart failure secondary to preserved systolic function Plan As per orders    Brocha Gilliam N 03/14/2013, 7:26 PM

## 2013-03-14 NOTE — ED Provider Notes (Signed)
Pt signed out to me at shift change. Pt with flu like symptoms for the last week. Today with nausea and diarrhea. CXR showed possible pneumonia. Pt did complain of abdominal pain, CT abd/pelvis was ordered. It came back negative other than pneumonia in left lower lung. Pt treated for CAP. Rocephin and Zithromax started. I spoke with Dr. Terrence Dupont, will come and see him.   Filed Vitals:   03/14/13 1630 03/14/13 1735 03/14/13 1800 03/14/13 1830  BP: 145/69 137/66 147/67 152/61  Pulse: 66 66 64 64  Temp:      TempSrc:      Resp: 22 14 22 18   Weight:      SpO2: 99% 97% 98% 96%     Renold Genta, PA-C 03/14/13 1901

## 2013-03-14 NOTE — ED Notes (Signed)
CT, Cathleen, notified that pt finished with contrast.

## 2013-03-14 NOTE — ED Notes (Signed)
Admitting MD at bedside.

## 2013-03-14 NOTE — ED Notes (Signed)
Attempted report x 1. This RN was told that the receiving nurse was in with another patient, and would call back.

## 2013-03-14 NOTE — ED Provider Notes (Signed)
CSN: [id number]     Arrival date & time 03/14/13  1301 History   First MD Initiated Contact with Patient 03/14/13 1355     Chief Complaint  Patient presents with  . Emesis  . Weakness  . Diarrhea   (Consider location/radiation/quality/duration/timing/severity/associated sxs/prior Treatment) HPI Comments: Patient presents with a chief complaint of nausea, diarrhea, and generalized abdominal pain.  She reports that her nausea and abdominal pain have been present for the past week.  Pain and nausea has been constant.  She denies vomiting, but reports that she has been dry heaving. She has also had diarrhea for the past couple of days.  She state that she has taken Perry, but does not feel that it is helping. She denies any blood in her stool.  She denies fever or chills.  She has had several abdominal surgeries in the past.  She has had an Appendectomy, Cholecystectomy, and Abdominal Hysterectomy.    Patient also reports that she has had a productive cough for the past week, which is gradually worsening.  She denies any shortness of breath or wheezing at this time.  She has a history of COPD and quit smoking in 2011.  She denies chest pain.  Denies fever or chills.  Denies lower extremity edema.  She has not taken anything for her cough.    The history is provided by the patient.    Past Medical History  Diagnosis Date  . HYPERTENSION   . CAROTID ARTERY DISEASE   . COPD     quit smoking 10/11  . GASTRITIS     with GI bleeding  . Edema   . Allergic rhinitis   . PVD (peripheral vascular disease)     s/p aortobifemoral bypass 12/08/09  . Chronic diastolic heart failure due to hypertrophic obstructive cardiomyopathy   . Atrial fibrillation     persistent atrial fibrillation, prior GI bleeding, presently not anticoagulated   Past Surgical History  Procedure Laterality Date  . Total abdominal hysterectomy    . Cholecystectomy    . Gastrectomy    . Rotator cuff repair    . Parotid  gland tumors removed    . Aortobifemoral bypass 12/08/09    . Appendectomy    . Eye surgery      bilateral cataract removal  . Lumbar laminectomy  03/13/2011    Procedure: MICRODISCECTOMY LUMBAR LAMINECTOMY;  Surgeon: Lawrence Santiago [facility];  Location: [place];  Service: Orthopedics;  Laterality: Bilateral;  Lumbar 3-4, lumbar 4-5, lumbar 5-sacrum 1 decompression.   Family History  Problem Relation Age of Onset  . Coronary artery disease Mother   . Coronary artery disease Sister   . Stroke Father    History  Substance Use Topics  . Smoking status: Former Smoker    Quit date: 12/21/2009  . Smokeless tobacco: Not on file     Comment: quit  . Alcohol Use: No   OB History   Grav Para Term Preterm Abortions TAB SAB Ect Mult Living                 Review of Systems  All other systems reviewed and are negative.    Allergies  Nsaids and Sulfonamide derivatives  Home Medications   Current Outpatient Rx  Name  Route  Sig  Dispense  Refill  . albuterol (PROVENTIL HFA;VENTOLIN HFA) 108 (90 BASE) MCG/ACT inhaler   Inhalation   Inhale 2 puffs into the lungs every 6 (six) hours as needed. For shortness of  breath.          Marland Kitchen amiodarone (PACERONE) 200 MG tablet   Oral   Take 100 mg by mouth 2 (two) times daily.    30 tablet   11   . EXPIRED: diltiazem (CARDIZEM CD) 180 MG 24 hr capsule   Oral   Take 1 capsule (180 mg total) by mouth daily.   30 capsule   11   . FERROUS SULFATE PO   Oral   Take 1 tablet by mouth daily.           Marland Kitchen EXPIRED: hydrochlorothiazide (,MICROZIDE/HYDRODIURIL,) 12.5 MG capsule   Oral   Take 1 capsule (12.5 mg total) by mouth daily.   30 capsule   11   . metoprolol tartrate (LOPRESSOR) 25 MG tablet   Oral   Take 12.5 mg by mouth 2 (two) times daily. 1/2 tablet twice daily          . pantoprazole (PROTONIX) 40 MG tablet   Oral   Take 40 mg by mouth 2 (two) times daily.            BP 105/57  Pulse 66  Temp(Src) 98.2 F (36.8 C) (Oral)   Resp 24  Wt 130 lb (58.968 kg)  SpO2 89% Physical Exam  Nursing note and vitals reviewed. Constitutional: She appears well-developed and well-nourished.  HENT:  Head: Normocephalic and atraumatic.  Neck: Normal range of motion. Neck supple.  Cardiovascular: Normal rate, regular rhythm and normal heart sounds.   Pulmonary/Chest: Effort normal. No accessory muscle usage. No respiratory distress. She has rhonchi.  Rhonchi of the RLL  Abdominal: Soft. Bowel sounds are normal. She exhibits distension. She exhibits no mass. There is generalized tenderness. There is no rebound and no guarding.  Large central abdominal scar from previous abdominal surgeries  Musculoskeletal: Normal range of motion.  Neurological: She is alert.  Skin: Skin is warm and dry.  Psychiatric: She has a normal mood and affect.    ED Course  Procedures (including critical care time) Labs Review Labs Reviewed  CBC WITH DIFFERENTIAL - Abnormal; Notable for the following:    WBC 15.2 (*)    Neutrophils Relative % 87 (*)    Neutro Abs 13.2 (*)    Lymphocytes Relative 6 (*)    Monocytes Absolute 1.1 (*)    All other components within normal limits  COMPREHENSIVE METABOLIC PANEL  URINALYSIS, ROUTINE W REFLEX MICROSCOPIC  POCT I-STAT TROPONIN I   Imaging Review No results found.  EKG Interpretation   None      4:00 PM Patient signed out at shift change.  CT ab/pelvis pending.  CXR showing RLL Pneumonia.  Patient lives at home with son.  No hospitalizations in the past 90 days.  Therefore, will treat for CAP with Ceftriaxone and Azithrmycin.    MDM  No diagnosis found. Patient presenting with two separate complaints.  Patient complaining of abdominal pain, nausea, and diarrhea.  Patient with a history of several abdominal surgeries in the past.  On exam, abdomen slightly distended with diffuse tenderness.  CT ab/pelvis ordered and results are pending.    Patient also complaining of a productive cough for  the past week.  CXR showing RLL Pneumonia.  Patient started on Azithromycin and Ceftriaxone.  Patient is slightly hypoxic on room air.  Patient placed on 2 L [place] Oxygen.  Plan is for the patient to be admitted for further management of CAP.    Hyman Bible, PA-C 03/16/13 0930

## 2013-03-15 LAB — CBC
HCT: 34.9 % — ABNORMAL LOW (ref 36.0–46.0)
Hemoglobin: 11.5 g/dL — ABNORMAL LOW (ref 12.0–15.0)
MCH: 30.3 pg (ref 26.0–34.0)
MCHC: 33 g/dL (ref 30.0–36.0)
MCV: 92.1 fL (ref 78.0–100.0)
PLATELETS: 129 10*3/uL — AB (ref 150–400)
RBC: 3.79 MIL/uL — AB (ref 3.87–5.11)
RDW: 14.1 % (ref 11.5–15.5)
WBC: 10.3 10*3/uL (ref 4.0–10.5)

## 2013-03-15 LAB — URINE CULTURE: Colony Count: 50000

## 2013-03-15 LAB — HIV ANTIBODY (ROUTINE TESTING W REFLEX): HIV: NONREACTIVE

## 2013-03-15 LAB — CLOSTRIDIUM DIFFICILE BY PCR: CDIFFPCR: NEGATIVE

## 2013-03-15 LAB — TSH: TSH: 0.455 u[IU]/mL (ref 0.350–4.500)

## 2013-03-15 LAB — HEPARIN LEVEL (UNFRACTIONATED): HEPARIN UNFRACTIONATED: 0.33 [IU]/mL (ref 0.30–0.70)

## 2013-03-15 MED ORDER — POTASSIUM CHLORIDE 10 MEQ/100ML IV SOLN
10.0000 meq | INTRAVENOUS | Status: AC
  Administered 2013-03-15 (×2): 10 meq via INTRAVENOUS
  Filled 2013-03-15 (×2): qty 100

## 2013-03-15 MED ORDER — APIXABAN 2.5 MG PO TABS
2.5000 mg | ORAL_TABLET | Freq: Two times a day (BID) | ORAL | Status: DC
  Administered 2013-03-15 – 2013-03-23 (×18): 2.5 mg via ORAL
  Filled 2013-03-15 (×21): qty 1

## 2013-03-15 MED ORDER — BIOTENE DRY MOUTH MT LIQD
15.0000 mL | Freq: Two times a day (BID) | OROMUCOSAL | Status: DC
  Administered 2013-03-15 – 2013-03-24 (×18): 15 mL via OROMUCOSAL

## 2013-03-15 MED ORDER — POTASSIUM CHLORIDE CRYS ER 20 MEQ PO TBCR
40.0000 meq | EXTENDED_RELEASE_TABLET | ORAL | Status: AC
  Administered 2013-03-15: 01:00:00 40 meq via ORAL
  Filled 2013-03-15: qty 2

## 2013-03-15 MED ORDER — POTASSIUM CHLORIDE CRYS ER 20 MEQ PO TBCR
40.0000 meq | EXTENDED_RELEASE_TABLET | Freq: Once | ORAL | Status: AC
  Administered 2013-03-16: 08:00:00 40 meq via ORAL
  Filled 2013-03-15: qty 2

## 2013-03-15 MED ORDER — HEPARIN (PORCINE) IN NACL 100-0.45 UNIT/ML-% IJ SOLN
900.0000 [IU]/h | INTRAMUSCULAR | Status: DC
  Administered 2013-03-15: 01:00:00 900 [IU]/h via INTRAVENOUS
  Filled 2013-03-15: qty 250

## 2013-03-15 MED ORDER — HEPARIN BOLUS VIA INFUSION
2000.0000 [IU] | Freq: Once | INTRAVENOUS | Status: AC
  Administered 2013-03-15: 01:00:00 2000 [IU] via INTRAVENOUS
  Filled 2013-03-15: qty 2000

## 2013-03-15 NOTE — Progress Notes (Signed)
Utilization review completed.  

## 2013-03-15 NOTE — Progress Notes (Signed)
Heparin 2000 units bolus given  & 9cc /hr started ;witnessed by [facility] RN

## 2013-03-15 NOTE — Progress Notes (Addendum)
ANTICOAGULATION CONSULT NOTE - Follow Up  Pharmacy Consult for Heparin Indication: atrial fibrillation  Allergies  Allergen Reactions  . Nsaids Other (See Comments)    Sick   . Sulfonamide Derivatives Other (See Comments)    Sick    Patient Measurements: Weight: 124 lb 5.4 oz (56.4 kg)  Vital Signs: Temp: 98.3 F (36.8 C) (01/13 0405) Temp src: Oral (01/13 0405) BP: 130/60 mmHg (01/13 1010) Pulse Rate: 64 (01/13 1010)  Labs:  Recent Labs  03/14/13 1357 03/14/13 2230 03/15/13 0800  HGB 14.6 12.1 11.5*  HCT 42.9 36.5 34.9*  PLT 164 129* 129*  HEPARINUNFRC  --   --  0.33  CREATININE 2.18* 1.95*  --    The CrCl is unknown because both a height and weight (above a minimum accepted value) are required for this calculation.  Medical History: Past Medical History  Diagnosis Date  . HYPERTENSION   . CAROTID ARTERY DISEASE   . COPD     quit smoking 10/11  . GASTRITIS     with GI bleeding  . Edema   . Allergic rhinitis   . PVD (peripheral vascular disease)     s/p aortobifemoral bypass 12/08/09  . Chronic diastolic heart failure due to hypertrophic obstructive cardiomyopathy   . Atrial fibrillation     persistent atrial fibrillation, prior GI bleeding, presently not anticoagulated  . High cholesterol   . Pneumonia 03/14/2013    LLL; "never had it before today" (03/14/2013)  . Exertional shortness of breath   . History of blood transfusion 12/2009    "related to bypass OR to my legs"   . Arthritis     "back" (03/14/2013)   Medications:  Prescriptions prior to admission  Medication Sig Dispense Refill  . amiodarone (PACERONE) 200 MG tablet Take 100 mg by mouth 2 (two) times daily.   30 tablet  11  . atorvastatin (LIPITOR) 20 MG tablet Take 20 mg by mouth daily.      Marland Kitchen HYDROcodone-acetaminophen (NORCO/VICODIN) 5-325 MG per tablet Take 1 tablet by mouth every 6 (six) hours as needed for moderate pain.      Marland Kitchen levothyroxine (SYNTHROID, LEVOTHROID) 50 MCG tablet Take  50 mcg by mouth daily before breakfast.      . metoprolol tartrate (LOPRESSOR) 25 MG tablet Take 12.5 mg by mouth 2 (two) times daily. 1/2 tablet twice daily       . diltiazem (CARDIZEM CD) 180 MG 24 hr capsule Take 1 capsule (180 mg total) by mouth daily.  30 capsule  11  . FERROUS SULFATE PO Take 1 tablet by mouth daily.        . hydrochlorothiazide (,MICROZIDE/HYDRODIURIL,) 12.5 MG capsule Take 1 capsule (12.5 mg total) by mouth daily.  30 capsule  11  . pantoprazole (PROTONIX) 40 MG tablet Take 40 mg by mouth 2 (two) times daily.         Assessment: 80 yo female with AFib and was on no anticoagulation prior to admit.  She was receiving Amiodarone for rate control.  Her H/H is down some from admit but appears stable and she is without noted bleeding complications.  She does have some mild thrombocytopenia with platelets of 129K this morning.  Her Heparin level is therapeutic (0.33) and within desired goal range.  Goal of Therapy:  Heparin level 0.3-0.7 units/ml Monitor platelets by anticoagulation protocol: Yes   Plan:  Will continue her rate at 900 units/hr Follow-up am labs.  Rober Minion, PharmD., MS Clinical Pharmacist Pager:  [phone] Thank you for allowing pharmacy to be part of this patients care team. 03/15/2013,10:58 AM  Addendum:  Plan now is to convert to oral anticoagulants with Apixaban (Eliquis).  I have reviewed her medications and do not see any major drug/drug interacting meds with apixaban.  She has a creatinine of 1.95 and weight < 60kg which warrants dose reduction.    Nonvalvular atrial fibrillation  Decrease dose to 2.5 mg PO BID in patients with any 2 of the following characteristics:  Age ?80 years  Weight ?60 kg  Serum creatinine ?1.5 mg/dL  Plan:  Will begin apixaban 2.5mg  bid and monitor for bleeding complications.

## 2013-03-15 NOTE — Progress Notes (Signed)
ANTICOAGULATION CONSULT NOTE - Initial Consult  Pharmacy Consult for Heparin Indication: atrial fibrillation  Allergies  Allergen Reactions  . Nsaids Other (See Comments)    Sick   . Sulfonamide Derivatives Other (See Comments)    Sick     Patient Measurements: Weight: 130 lb (58.968 kg)  Vital Signs: Temp: 98.4 F (36.9 C) (01/12 2127) Temp src: Oral (01/12 2127) BP: 137/69 mmHg (01/12 2127) Pulse Rate: 71 (01/12 2127)  Labs:  Recent Labs  03/14/13 1357 03/14/13 2230  HGB 14.6 12.1  HCT 42.9 36.5  PLT 164 129*  CREATININE 2.18* 1.95*    The CrCl is unknown because both a height and weight (above a minimum accepted value) are required for this calculation.   Medical History: Past Medical History  Diagnosis Date  . HYPERTENSION   . CAROTID ARTERY DISEASE   . COPD     quit smoking 10/11  . GASTRITIS     with GI bleeding  . Edema   . Allergic rhinitis   . PVD (peripheral vascular disease)     s/p aortobifemoral bypass 12/08/09  . Chronic diastolic heart failure due to hypertrophic obstructive cardiomyopathy   . Atrial fibrillation     persistent atrial fibrillation, prior GI bleeding, presently not anticoagulated  . High cholesterol   . Pneumonia 03/14/2013    LLL; "never had it before today" (03/14/2013)  . Exertional shortness of breath   . History of blood transfusion 12/2009    "related to bypass OR to my legs"   . Arthritis     "back" (03/14/2013)    Medications:  Prescriptions prior to admission  Medication Sig Dispense Refill  . amiodarone (PACERONE) 200 MG tablet Take 100 mg by mouth 2 (two) times daily.   30 tablet  11  . atorvastatin (LIPITOR) 20 MG tablet Take 20 mg by mouth daily.      Marland Kitchen HYDROcodone-acetaminophen (NORCO/VICODIN) 5-325 MG per tablet Take 1 tablet by mouth every 6 (six) hours as needed for moderate pain.      Marland Kitchen levothyroxine (SYNTHROID, LEVOTHROID) 50 MCG tablet Take 50 mcg by mouth daily before breakfast.      . metoprolol  tartrate (LOPRESSOR) 25 MG tablet Take 12.5 mg by mouth 2 (two) times daily. 1/2 tablet twice daily       . diltiazem (CARDIZEM CD) 180 MG 24 hr capsule Take 1 capsule (180 mg total) by mouth daily.  30 capsule  11  . FERROUS SULFATE PO Take 1 tablet by mouth daily.        . hydrochlorothiazide (,MICROZIDE/HYDRODIURIL,) 12.5 MG capsule Take 1 capsule (12.5 mg total) by mouth daily.  30 capsule  11  . pantoprazole (PROTONIX) 40 MG tablet Take 40 mg by mouth 2 (two) times daily.          Assessment: 80 yo female with AFib for heparin  Goal of Therapy:  Heparin level 0.3-0.7 units/ml Monitor platelets by anticoagulation protocol: Yes   Plan:  Heparin 2000 units IV bolus, then 900 units/hr Follow-up am labs.   Josefa [place], Bronson Curb 03/15/2013,12:13 AM

## 2013-03-15 NOTE — Progress Notes (Signed)
Subjective:  Patient denies any chest pain or shortness of breath states coughing has improved denies any abdominal pain.. Discussed at length regarding chronic anticoagulation in view of recurrent paroxysmal atrial fibrillation its risk and benefits and agrees for oral anticoagulants  Objective:  Vital Signs in the last 24 hours: Temp:  [98.2 F (36.8 C)-98.4 F (36.9 C)] 98.3 F (36.8 C) (01/13 0405) Pulse Rate:  [64-75] 64 (01/13 1010) Resp:  [14-30] 20 (01/13 0405) BP: (101-152)/(50-80) 130/60 mmHg (01/13 1010) SpO2:  [88 %-100 %] 98 % (01/13 0405) Weight:  [56.4 kg (124 lb 5.4 oz)-58.968 kg (130 lb)] 56.4 kg (124 lb 5.4 oz) (01/13 0405)  Intake/Output from previous day: 01/12 0701 - 01/13 0700 In: 2048 [I.V.:1848; IV Piggyback:200] Out: 150 [Urine:150] Intake/Output from this shift:    Physical Exam: Neck: no adenopathy, no carotid bruit, no JVD and supple, symmetrical, trachea midline Lungs: decreased breath sound at bases with lung rhonchi Heart: regular rate and rhythm, S1, S2 normal and  systolic murmur noted Abdomen: soft, non-tender; bowel sounds normal; no masses,  no organomegaly Extremities: extremities normal, atraumatic, no cyanosis or edema  Lab Results:  Recent Labs  03/14/13 2230 03/15/13 0800  WBC 11.8* 10.3  HGB 12.1 11.5*  PLT 129* 129*    Recent Labs  03/14/13 1357 03/14/13 2230  NA 144 138  K 3.1* 2.7*  CL 96 97  CO2 30 28  GLUCOSE 115* 69*  BUN 60* 54*  CREATININE 2.18* 1.95*   No results found for this basename: TROPONINI, CK, MB,  in the last 72 hours Hepatic Function Panel  Recent Labs  03/14/13 2230  PROT 6.3  ALBUMIN 2.5*  AST 22  ALT 15  ALKPHOS 82  BILITOT 0.9   No results found for this basename: CHOL,  in the last 72 hours No results found for this basename: PROTIME,  in the last 72 hours  Imaging: Imaging results have been reviewed and Ct Abdomen Pelvis Wo Contrast  03/14/2013   CLINICAL DATA:  Nausea, vomiting  and diarrhea. Generalize weakness and fatigue.  EXAM: CT ABDOMEN AND PELVIS WITHOUT CONTRAST  TECHNIQUE: Multidetector CT imaging of the abdomen and pelvis was performed following the standard protocol without intravenous contrast.  COMPARISON:  06/20/2010  FINDINGS: Patchy areas of airspace consolidation are noted in the lingula and left lower lobe. Multiple tree-in-bud nodules are identified within the right lower lobe. No focal liver abnormality identified. The patient is status post cholecystectomy. The common bile duct appears increased in caliber measuring up to 1 cm. Normal appearance of the pancreas. The spleen is unremarkable.  The right adrenal gland is within normal limits. Stable left adrenal gland nodule measures 1 x 1.5 cm, image 22/series 2. Bilateral perinephric fat stranding noted and appears chronic. There is no nephrolithiasis or hydronephrosis. The urinary bladder appears normal. Previous hysterectomy. The patient is status post bypass grafting of abdominal aortic aneurysm. No upper abdominal adenopathy noted. There is no pelvic or inguinal adenopathy identified. No free fluid or fluid collection within be abdomen or pelvis.  Postoperative changes from partial gastrectomy identified. The small bowel loops have a normal course and caliber. No obstruction. There is a normal caliber of the colon. Periumbilical hernia is identified. This contains fat and a portion of nonobstructed transverse colon, image 37/series 2. Multiple distal colonic diverticula are noted. No acute inflammation.  Review of the visualized osseous structures is negative for lytic or sclerotic bone lesion. There is multi level lumbar degenerative disc disease  identified within the lumbar spine.  IMPRESSION: 1. Airspace opacities within the left lower lobe are identified and worrisome for pneumonia. The tree in bud nodules scattered throughout the right lower lobe are likely postinflammatory or infectious. 2. Prior cholecystectomy  3. Prior partial gastrectomy. 4. Previous bypass grafting of abdominal aortic aneurysm. 5. Sigmoid diverticulosis without acute inflammation.   Electronically Signed   By: Kerby Moors M.D.   On: 03/14/2013 17:48   Dg Chest 2 View  03/14/2013   CLINICAL DATA:  80 year old female with cough weakness shortness of breath abdominal pain nausea. Initial encounter.  EXAM: CHEST  2 VIEW  COMPARISON:  03/11/2011 and earlier.  FINDINGS: Seated AP and lateral views of the chest. Confluent left lung base opacity is new. No associated pleural effusion. Normal cardiac size and mediastinal contours. Upper lobes are stable and clear. No pneumothorax or edema. No acute osseous abnormality identified. Chronic epigastric surgical clips.  IMPRESSION: Left lower lobe pneumonia. Post treatment radiographs recommended to document resolution.   Electronically Signed   By: Lars Pinks M.D.   On: 03/14/2013 15:40    Cardiac Studies:  Assessment/Plan:  Community-acquired left lower lobe pneumonia  Status post viral syndrome  Probable gastroenteritis  Hypertension  Mild CAD  COPD  Chronic recurrent atrial fibrillation  Hypothyroidism  Chronic kidney disease  Peripheral vascular disease  History of congestive heart failure secondary to preserved systolic function Plan DC heparin Start as eliquis as per orders Continue rest of meds.   LOS: 1 day    Brenda [name] 03/15/2013, 11:41 AM

## 2013-03-16 LAB — BASIC METABOLIC PANEL
BUN: 29 mg/dL — ABNORMAL HIGH (ref 6–23)
CHLORIDE: 109 meq/L (ref 96–112)
CO2: 26 mEq/L (ref 19–32)
Calcium: 8.3 mg/dL — ABNORMAL LOW (ref 8.4–10.5)
Creatinine, Ser: 1.42 mg/dL — ABNORMAL HIGH (ref 0.50–1.10)
GFR calc non Af Amer: 34 mL/min — ABNORMAL LOW (ref >90)
GFR, EST AFRICAN AMERICAN: 40 mL/min — AB (ref >90)
Glucose, Bld: 87 mg/dL (ref 70–99)
POTASSIUM: 4.6 meq/L (ref 3.7–5.3)
Sodium: 146 mEq/L (ref 137–147)

## 2013-03-16 LAB — LIPASE, BLOOD: Lipase: 61 U/L — ABNORMAL HIGH (ref 11–59)

## 2013-03-16 LAB — CBC
HEMATOCRIT: 36.8 % (ref 36.0–46.0)
Hemoglobin: 11.9 g/dL — ABNORMAL LOW (ref 12.0–15.0)
MCH: 30.3 pg (ref 26.0–34.0)
MCHC: 32.3 g/dL (ref 30.0–36.0)
MCV: 93.6 fL (ref 78.0–100.0)
Platelets: 168 10*3/uL (ref 150–400)
RBC: 3.93 MIL/uL (ref 3.87–5.11)
RDW: 14.3 % (ref 11.5–15.5)
WBC: 8.9 10*3/uL (ref 4.0–10.5)

## 2013-03-16 LAB — AMYLASE: AMYLASE: 51 U/L (ref 0–105)

## 2013-03-16 MED ORDER — ENSURE PLUS PO LIQD
237.0000 mL | Freq: Three times a day (TID) | ORAL | Status: DC
  Administered 2013-03-16 – 2013-03-18 (×3): 237 mL via ORAL
  Filled 2013-03-16 (×14): qty 237

## 2013-03-16 MED ORDER — FUROSEMIDE 10 MG/ML IJ SOLN
40.0000 mg | Freq: Once | INTRAMUSCULAR | Status: AC
  Administered 2013-03-16: 40 mg via INTRAVENOUS
  Filled 2013-03-16: qty 4

## 2013-03-16 MED ORDER — LEVALBUTEROL HCL 1.25 MG/0.5ML IN NEBU
1.2500 mg | INHALATION_SOLUTION | Freq: Four times a day (QID) | RESPIRATORY_TRACT | Status: DC | PRN
  Administered 2013-03-16: 23:00:00 1.25 mg via RESPIRATORY_TRACT
  Filled 2013-03-16: qty 0.5

## 2013-03-16 NOTE — Progress Notes (Signed)
Called into pt room, pt desat on 2L of 02 at 86-89%, pt experiencing shortness of breath, rhonchi and wheezes on auscultation. Increased to 3 L, sats ranging 88-92%. Notified Terrence Dupont, MD. New orders given. Called respiratory therapist to administer prn tx. Continuing to monitor pt.

## 2013-03-16 NOTE — Progress Notes (Addendum)
ANTICOAGULATION CONSULT NOTE - Follow Up  Pharmacy Consult for Eliquis Indication: atrial fibrillation  Assessment: 80 yo female with AFib and was on no anticoagulation prior to admit.  She was receiving Amiodarone for rate control.  Her H/H is stable and she is without noted bleeding complications.  Thrombocytopenia has improved.  She was changed to apixaban for oral anticoagulation yesterday.   Her CHA2DS2-VASc Score for Atrial Fibrillation Stroke Risk is high at 6.  Age in Years  ?47 = +2  Sex  Female = +1  Congestive Heart Failure History  yes = +1  Hypertension History  yes = +1  Vascular Disease History  yes = +1  Her renal function has improved to 1.42 today and I suspect this may be her baseline since a year ago was 1.44 as well.  She is nearing 80 years of age (Feb. of this year) which along with her weight would require a dose reduction and she has a history for GI bleeding.   I will continue this current dose and defer to cardiology for risk/benefit assessment for dose increase.  Allergies  Allergen Reactions  . Nsaids Other (See Comments)    Sick   . Sulfonamide Derivatives Other (See Comments)    Sick    Patient Measurements: Weight: 125 lb (56.7 kg)  Vital Signs: Temp: 98.2 F (36.8 C) (01/14 0700) Temp src: Oral (01/14 0700) BP: 150/78 mmHg (01/14 0700) Pulse Rate: 66 (01/14 0700)  Labs:  Recent Labs  03/14/13 1357 03/14/13 2230 03/15/13 0800 03/16/13 0540  HGB 14.6 12.1 11.5* 11.9*  HCT 42.9 36.5 34.9* 36.8  PLT 164 129* 129* 168  HEPARINUNFRC  --   --  0.33  --   CREATININE 2.18* 1.95*  --  1.42*   The CrCl is unknown because both a height and weight (above a minimum accepted value) are required for this calculation.  Medical History: Past Medical History  Diagnosis Date  . HYPERTENSION   . CAROTID ARTERY DISEASE   . COPD     quit smoking 10/11  . GASTRITIS     with GI bleeding  . Edema   . Allergic rhinitis   . PVD (peripheral  vascular disease)     s/p aortobifemoral bypass 12/08/09  . Chronic diastolic heart failure due to hypertrophic obstructive cardiomyopathy   . Atrial fibrillation     persistent atrial fibrillation, prior GI bleeding, presently not anticoagulated  . High cholesterol   . Pneumonia 03/14/2013    LLL; "never had it before today" (03/14/2013)  . Exertional shortness of breath   . History of blood transfusion 12/2009    "related to bypass OR to my legs"   . Arthritis     "back" (03/14/2013)   Medications:  Prescriptions prior to admission  Medication Sig Dispense Refill  . amiodarone (PACERONE) 200 MG tablet Take 200 mg by mouth daily.      Marland Kitchen atorvastatin (LIPITOR) 20 MG tablet Take 20 mg by mouth daily.      Marland Kitchen FERROUS SULFATE PO Take 1 tablet by mouth daily.        . hydrochlorothiazide (MICROZIDE) 12.5 MG capsule Take 12.5 mg by mouth daily.      Marland Kitchen HYDROcodone-acetaminophen (NORCO) 7.5-325 MG per tablet Take 1 tablet by mouth 2 (two) times daily as needed for moderate pain.      Marland Kitchen levothyroxine (SYNTHROID, LEVOTHROID) 50 MCG tablet Take 50 mcg by mouth daily before breakfast.      . metoprolol tartrate (  LOPRESSOR) 25 MG tablet Take 12.5 mg by mouth 2 (two) times daily. 1/2 tablet twice daily       . pantoprazole (PROTONIX) 40 MG tablet Take 40 mg by mouth 2 (two) times daily.        Marland Kitchen diltiazem (CARDIZEM CD) 180 MG 24 hr capsule Take 1 capsule (180 mg total) by mouth daily.  30 capsule  11  . hydrochlorothiazide (,MICROZIDE/HYDRODIURIL,) 12.5 MG capsule Take 1 capsule (12.5 mg total) by mouth daily.  30 capsule  11   Goal of Therapy:  Stroke risk reduction with known atrial fibrillation   Plan:  1).  Continue apixaban 2.5mg  twice daily 2).  Monitor for s/s of bleeding complications  Rober Minion, PharmD., MS Clinical Pharmacist Pager:  [phone] Thank you for allowing pharmacy to be part of this patients care team. 03/16/2013,9:48 AM

## 2013-03-16 NOTE — Care Management Note (Addendum)
    Page 1 of 2   03/22/2013     11:48:04 AM   CARE MANAGEMENT NOTE 03/22/2013  Patient:  Brenda [name], Brenda [name]   Account Number:  [id number]  Date Initiated:  03/16/2013  Documentation initiated by:  Tomi Bamberger  Subjective/Objective Assessment:   dx left lower lobe pna  admit- lives with daughter and son n law.  patient has rolling walker, bsc, and shower chair.     Action/Plan:   pt eval-hhpt   Anticipated DC Date:  03/22/2013   Anticipated DC Plan:  [place]  CM consult  Medication Assistance      [facility] Choice  HOME HEALTH   Choice offered to / List presented to:  C-1 Patient        [place] arranged  HH-1 RN  [place].   Status of service:  Completed, signed off Medicare Important Message given?   (If response is "NO", the following Medicare IM given date fields will be blank) Date Medicare IM given:   Date Additional Medicare IM given:    Discharge Disposition:  [place]  Per UR Regulation:  Reviewed for med. necessity/level of care/duration of stay  If discussed at [place] of Stay Meetings, dates discussed:    Comments:  03/22/13 11:45 Tomi Bamberger RN, BSN [phone] patient for possible dc today, patient has Eliquis 30 day free card and HHPT and RN set up with [facility].  [place] notified patient may be dc today.  1/16  1016 debbie dowell rn,bsn phy ther rec hhpt. pt states she has used [facility] in past and would like them again. ref to donna w [facility] for rn and pt. await final [facility] orders. gave pt 30day free eliquis card. alerted pt she has 80.00 per month copay for eliquis also.  1/15  1211 debbie dowell rn,bsn pt has 80.00 per month copay for eliquis-no prior auth req.  03/16/13 Tomi Bamberger RN, BSN [phone]  patient lives with daughter and son n law, await pt eval. NCM will continue to follow for dc needs.

## 2013-03-16 NOTE — Progress Notes (Signed)
Subjective:  Patient complains of vague abdominal pain states breathing has improved overall feeling little better  Objective:  Vital Signs in the last 24 hours: Temp:  [97.7 F (36.5 C)-98.2 F (36.8 C)] 98.2 F (36.8 C) (01/14 0700) Pulse Rate:  [62-71] 66 (01/14 0700) Resp:  [20-34] 28 (01/14 0700) BP: (129-166)/(68-78) 150/78 mmHg (01/14 0700) SpO2:  [95 %-97 %] 97 % (01/14 0700) Weight:  [56.7 kg (125 lb)] 56.7 kg (125 lb) (01/14 0700)  Intake/Output from previous day: 01/13 0701 - 01/14 0700 In: 297.1 [P.O.:236; I.V.:61.1] Out: -  Intake/Output from this shift:    Physical Exam: Neck: no adenopathy, no carotid bruit, no JVD and supple, symmetrical, trachea midline Lungs: Decreased breath sound at bases with occasional left lung rhonchi Heart: regular rate and rhythm, S1, S2 normal and Soft systolic murmur noted Abdomen: soft, non-tender; bowel sounds normal; no masses,  no organomegaly Extremities: extremities normal, atraumatic, no cyanosis or edema  Lab Results:  Recent Labs  03/15/13 0800 03/16/13 0540  WBC 10.3 8.9  HGB 11.5* 11.9*  PLT 129* 168    Recent Labs  03/14/13 2230 03/16/13 0540  NA 138 146  K 2.7* 4.6  CL 97 109  CO2 28 26  GLUCOSE 69* 87  BUN 54* 29*  CREATININE 1.95* 1.42*   No results found for this basename: TROPONINI, CK, MB,  in the last 72 hours Hepatic Function Panel  Recent Labs  03/14/13 2230  PROT 6.3  ALBUMIN 2.5*  AST 22  ALT 15  ALKPHOS 82  BILITOT 0.9   No results found for this basename: CHOL,  in the last 72 hours No results found for this basename: PROTIME,  in the last 72 hours  Imaging: Imaging results have been reviewed and Ct Abdomen Pelvis Wo Contrast  03/14/2013   CLINICAL DATA:  Nausea, vomiting and diarrhea. Generalize weakness and fatigue.  EXAM: CT ABDOMEN AND PELVIS WITHOUT CONTRAST  TECHNIQUE: Multidetector CT imaging of the abdomen and pelvis was performed following the standard protocol without  intravenous contrast.  COMPARISON:  06/20/2010  FINDINGS: Patchy areas of airspace consolidation are noted in the lingula and left lower lobe. Multiple tree-in-bud nodules are identified within the right lower lobe. No focal liver abnormality identified. The patient is status post cholecystectomy. The common bile duct appears increased in caliber measuring up to 1 cm. Normal appearance of the pancreas. The spleen is unremarkable.  The right adrenal gland is within normal limits. Stable left adrenal gland nodule measures 1 x 1.5 cm, image 22/series 2. Bilateral perinephric fat stranding noted and appears chronic. There is no nephrolithiasis or hydronephrosis. The urinary bladder appears normal. Previous hysterectomy. The patient is status post bypass grafting of abdominal aortic aneurysm. No upper abdominal adenopathy noted. There is no pelvic or inguinal adenopathy identified. No free fluid or fluid collection within be abdomen or pelvis.  Postoperative changes from partial gastrectomy identified. The small bowel loops have a normal course and caliber. No obstruction. There is a normal caliber of the colon. Periumbilical hernia is identified. This contains fat and a portion of nonobstructed transverse colon, image 37/series 2. Multiple distal colonic diverticula are noted. No acute inflammation.  Review of the visualized osseous structures is negative for lytic or sclerotic bone lesion. There is multi level lumbar degenerative disc disease identified within the lumbar spine.  IMPRESSION: 1. Airspace opacities within the left lower lobe are identified and worrisome for pneumonia. The tree in bud nodules scattered throughout the right lower  lobe are likely postinflammatory or infectious. 2. Prior cholecystectomy 3. Prior partial gastrectomy. 4. Previous bypass grafting of abdominal aortic aneurysm. 5. Sigmoid diverticulosis without acute inflammation.   Electronically Signed   By: Kerby Moors M.D.   On: 03/14/2013  17:48   Dg Chest 2 View  03/14/2013   CLINICAL DATA:  80 year old female with cough weakness shortness of breath abdominal pain nausea. Initial encounter.  EXAM: CHEST  2 VIEW  COMPARISON:  03/11/2011 and earlier.  FINDINGS: Seated AP and lateral views of the chest. Confluent left lung base opacity is new. No associated pleural effusion. Normal cardiac size and mediastinal contours. Upper lobes are stable and clear. No pneumothorax or edema. No acute osseous abnormality identified. Chronic epigastric surgical clips.  IMPRESSION: Left lower lobe pneumonia. Post treatment radiographs recommended to document resolution.   Electronically Signed   By: Lars Pinks M.D.   On: 03/14/2013 15:40    Cardiac Studies:  Assessment/Plan:  Resolving Community-acquired left lower lobe pneumonia  Status post viral syndrome  Resolving Probable gastroenteritis  Hypertension  Mild CAD  COPD  Chronic recurrent atrial fibrillation  Hypothyroidism  Chronic kidney disease  Peripheral vascular disease  History of congestive heart failure secondary to preserved systolic function  Plan Continue present management Check amylase lipase OT PT consult  LOS: 2 days    Brenda [name] N 03/16/2013, 11:46 AM

## 2013-03-17 ENCOUNTER — Inpatient Hospital Stay (HOSPITAL_COMMUNITY): Payer: Medicare Other

## 2013-03-17 DIAGNOSIS — J9601 Acute respiratory failure with hypoxia: Secondary | ICD-10-CM

## 2013-03-17 DIAGNOSIS — J9600 Acute respiratory failure, unspecified whether with hypoxia or hypercapnia: Secondary | ICD-10-CM

## 2013-03-17 DIAGNOSIS — J189 Pneumonia, unspecified organism: Principal | ICD-10-CM

## 2013-03-17 LAB — COMPREHENSIVE METABOLIC PANEL
ALT: 19 U/L (ref 0–35)
AST: 29 U/L (ref 0–37)
Albumin: 2.6 g/dL — ABNORMAL LOW (ref 3.5–5.2)
Alkaline Phosphatase: 86 U/L (ref 39–117)
BUN: 20 mg/dL (ref 6–23)
CALCIUM: 8.7 mg/dL (ref 8.4–10.5)
CO2: 29 mEq/L (ref 19–32)
Chloride: 104 mEq/L (ref 96–112)
Creatinine, Ser: 1.36 mg/dL — ABNORMAL HIGH (ref 0.50–1.10)
GFR calc Af Amer: 42 mL/min — ABNORMAL LOW (ref >90)
GFR calc non Af Amer: 36 mL/min — ABNORMAL LOW (ref >90)
Glucose, Bld: 153 mg/dL — ABNORMAL HIGH (ref 70–99)
Potassium: 4.4 mEq/L (ref 3.7–5.3)
SODIUM: 148 meq/L — AB (ref 137–147)
TOTAL PROTEIN: 7 g/dL (ref 6.0–8.3)
Total Bilirubin: 0.6 mg/dL (ref 0.3–1.2)

## 2013-03-17 LAB — POCT I-STAT 3, ART BLOOD GAS (G3+)
ACID-BASE EXCESS: 4 mmol/L — AB (ref 0.0–2.0)
Bicarbonate: 29.9 mEq/L — ABNORMAL HIGH (ref 20.0–24.0)
O2 Saturation: 97 %
Patient temperature: 97.8
TCO2: 31 mmol/L (ref 0–100)
pCO2 arterial: 50.6 mmHg — ABNORMAL HIGH (ref 35.0–45.0)
pH, Arterial: 7.377 (ref 7.350–7.450)
pO2, Arterial: 89 mmHg (ref 80.0–100.0)

## 2013-03-17 LAB — BLOOD GAS, ARTERIAL
Acid-base deficit: 0.7 mmol/L (ref 0.0–2.0)
Bicarbonate: 26.9 mEq/L — ABNORMAL HIGH (ref 20.0–24.0)
DRAWN BY: 290171
FIO2: 1 %
O2 Saturation: 98.8 %
PCO2 ART: 75.8 mmHg — AB (ref 35.0–45.0)
PH ART: 7.176 — AB (ref 7.350–7.450)
PO2 ART: 200 mmHg — AB (ref 80.0–100.0)
Patient temperature: 98.6
TCO2: 29.2 mmol/L (ref 0–100)

## 2013-03-17 LAB — CBC
HCT: 43.2 % (ref 36.0–46.0)
Hemoglobin: 13.8 g/dL (ref 12.0–15.0)
MCH: 31.3 pg (ref 26.0–34.0)
MCHC: 31.9 g/dL (ref 30.0–36.0)
MCV: 98 fL (ref 78.0–100.0)
Platelets: 206 10*3/uL (ref 150–400)
RBC: 4.41 MIL/uL (ref 3.87–5.11)
RDW: 14.4 % (ref 11.5–15.5)
WBC: 16.8 10*3/uL — ABNORMAL HIGH (ref 4.0–10.5)

## 2013-03-17 LAB — TROPONIN I
TROPONIN I: 1.03 ng/mL — AB (ref <0.30)
TROPONIN I: 0.58 ng/mL — AB (ref <0.30)
Troponin I: 0.49 ng/mL (ref <0.30)

## 2013-03-17 LAB — STREP PNEUMONIAE URINARY ANTIGEN: Strep Pneumo Urinary Antigen: NEGATIVE

## 2013-03-17 LAB — MRSA PCR SCREENING: MRSA BY PCR: NEGATIVE

## 2013-03-17 MED ORDER — FUROSEMIDE 10 MG/ML IJ SOLN
40.0000 mg | Freq: Once | INTRAMUSCULAR | Status: AC
  Administered 2013-03-17: 40 mg via INTRAVENOUS

## 2013-03-17 MED ORDER — DIGOXIN 0.25 MG/ML IJ SOLN
0.2500 mg | Freq: Every day | INTRAMUSCULAR | Status: AC
  Administered 2013-03-17: 0.25 mg via INTRAVENOUS
  Filled 2013-03-17 (×2): qty 1

## 2013-03-17 MED ORDER — POTASSIUM CHLORIDE CRYS ER 20 MEQ PO TBCR
20.0000 meq | EXTENDED_RELEASE_TABLET | Freq: Two times a day (BID) | ORAL | Status: DC
Start: 2013-03-17 — End: 2013-03-21
  Administered 2013-03-17 – 2013-03-21 (×9): 20 meq via ORAL
  Filled 2013-03-17 (×12): qty 1

## 2013-03-17 MED ORDER — AMIODARONE LOAD VIA INFUSION
150.0000 mg | Freq: Once | INTRAVENOUS | Status: AC
  Administered 2013-03-17: 150 mg via INTRAVENOUS
  Filled 2013-03-17: qty 83.34

## 2013-03-17 MED ORDER — AMIODARONE HCL IN DEXTROSE 360-4.14 MG/200ML-% IV SOLN
30.0000 mg/h | INTRAVENOUS | Status: DC
  Administered 2013-03-17 – 2013-03-18 (×2): 30 mg/h via INTRAVENOUS
  Filled 2013-03-17 (×3): qty 200

## 2013-03-17 MED ORDER — METOPROLOL TARTRATE 1 MG/ML IV SOLN
INTRAVENOUS | Status: AC
  Administered 2013-03-17: 5 mg
  Filled 2013-03-17: qty 5

## 2013-03-17 MED ORDER — AMIODARONE HCL IN DEXTROSE 360-4.14 MG/200ML-% IV SOLN
INTRAVENOUS | Status: AC
  Filled 2013-03-17: qty 200

## 2013-03-17 MED ORDER — FUROSEMIDE 10 MG/ML IJ SOLN
INTRAMUSCULAR | Status: AC
  Filled 2013-03-17: qty 4

## 2013-03-17 MED ORDER — AMIODARONE HCL IN DEXTROSE 360-4.14 MG/200ML-% IV SOLN
60.0000 mg/h | INTRAVENOUS | Status: AC
  Administered 2013-03-17 (×2): 60 mg/h via INTRAVENOUS
  Filled 2013-03-17: qty 200

## 2013-03-17 MED ORDER — AMIODARONE IV BOLUS ONLY 150 MG/100ML: 150.0000 mg | Freq: Once | INTRAVENOUS | Status: AC

## 2013-03-17 MED ORDER — SODIUM BICARBONATE 8.4 % IV SOLN
50.0000 meq | Freq: Once | INTRAVENOUS | Status: AC
  Administered 2013-03-17: 50 meq via INTRAVENOUS
  Filled 2013-03-17: qty 50

## 2013-03-17 MED ORDER — ACETAMINOPHEN 325 MG PO TABS
650.0000 mg | ORAL_TABLET | Freq: Four times a day (QID) | ORAL | Status: DC | PRN
  Administered 2013-03-18 – 2013-03-24 (×3): 650 mg via ORAL
  Filled 2013-03-17 (×3): qty 2

## 2013-03-17 MED ORDER — FUROSEMIDE 10 MG/ML IJ SOLN
80.0000 mg | Freq: Once | INTRAMUSCULAR | Status: AC
  Administered 2013-03-17: 80 mg via INTRAVENOUS

## 2013-03-17 MED ORDER — NITROGLYCERIN IN D5W 200-5 MCG/ML-% IV SOLN
10.0000 ug/min | INTRAVENOUS | Status: DC
  Administered 2013-03-17: 10 ug/min via INTRAVENOUS
  Filled 2013-03-17: qty 250

## 2013-03-17 MED ORDER — FUROSEMIDE 10 MG/ML IJ SOLN
40.0000 mg | Freq: Every day | INTRAMUSCULAR | Status: DC
  Administered 2013-03-17 – 2013-03-19 (×3): 40 mg via INTRAVENOUS
  Filled 2013-03-17 (×3): qty 4

## 2013-03-17 NOTE — Consult Note (Signed)
Name: Brenda [name] MRN: [id number] DOB: 1932-02-12    ADMISSION DATE:  03/14/2013 CONSULTATION DATE:  03/17/13  REFERRING MD :  Dr. Terrence Dupont PRIMARY SERVICE: Dr. Terrence Dupont    CHIEF COMPLAINT:  Acute Respiratory Failure  BRIEF PATIENT DESCRIPTION: 80 y/o F admitted on 1/12 with abdominal pain / diarrhea and CAP.  Decompensated from resp standpoint 1/15 and tx to ICU on bipap.    SIGNIFICANT EVENTS / STUDIES:  1/12 - Admit with CAP, diarrhea / abd pain.  CT Abd/pelvis neg for abd issues, positive LLL opacities, RLL tree in bud nodules 1/13 - PAF 1/14 - Desaturation to mid 80's, increased SOB/tachypnea with rate in 40's, lethargy.  Rx'd with lasix IV, placed on bipap, tx to ICU  LINES / TUBES:  CULTURES: BCx2 1/12>>> C-Diff 1/13>>>neg UC 1/12>>>50k multiple morphotypes   ANTIBIOTICS: Azithro 1/12>>> Rocephin 1/12>>>  HISTORY OF PRESENT ILLNESS:  80 y/o F, former smoker,  with PMH of hypothyroidism, CKD, HTN, CAD / PVD, CHF,  Afib with prior GIB on anti-coagulation & COPD who presented to [facility] ER on 1/12 with a 2 wk hx abdominal pain, diarrhea, cough with yellow sputum production and flu-like weakness.  Sypmtoms progressively worsened and she presented to ED for evaluation.  CT of abdomen/pelvis was negative for acute abd issues but was positive for LLL opacities & RLL tree in bud nodules.  Patient was admitted per Dr. Terrence Dupont for further evaluation.    Patient was treated for CAP with IV rocephin / azithro, pan cultured (neg thus far), c-diff negative.  1/14 late evening patient developed acute respiratory distress with tachypnea, desaturations and altered mental status.  She was treated with IV lasix and transferred to ICU on bipap for further evaluation.  ABG prior to transfer demonstrated hypercarbic respiratory failure.  PCCM consulted for evaluation.   PAST MEDICAL HISTORY :  Past Medical History  Diagnosis Date  . HYPERTENSION   . CAROTID ARTERY DISEASE   . COPD     quit  smoking 10/11  . GASTRITIS     with GI bleeding  . Edema   . Allergic rhinitis   . PVD (peripheral vascular disease)     s/p aortobifemoral bypass 12/08/09  . Chronic diastolic heart failure due to hypertrophic obstructive cardiomyopathy   . Atrial fibrillation     persistent atrial fibrillation, prior GI bleeding, presently not anticoagulated  . High cholesterol   . Pneumonia 03/14/2013    LLL; "never had it before today" (03/14/2013)  . Exertional shortness of breath   . History of blood transfusion 12/2009    "related to bypass OR to my legs"   . Arthritis     "back" (03/14/2013)   Past Surgical History  Procedure Laterality Date  . Cholecystectomy    . Partial gastrectomy      "stomach ulcer"   . Shoulder open rotator cuff repair Right   . Parotid gland tumor excision    . Aorto-femoral bypass graft Bilateral 12/08/2009    aortobifemoral bypass 12/08/09 [Other]  . Appendectomy    . Cataract extraction w/ intraocular lens  implant, bilateral Bilateral   . Lumbar laminectomy  03/13/2011    Procedure: MICRODISCECTOMY LUMBAR LAMINECTOMY;  Surgeon: [facility];  Location: [facility];  Service: Orthopedics;  Laterality: Bilateral;  Lumbar 3-4, lumbar 4-5, lumbar 5-sacrum 1 decompression.  . Total abdominal hysterectomy      "i've had 2; they did a partial then a complete"  . Cardiac catheterization  Prior to Admission medications   Medication Sig Start Date End Date Taking? Authorizing Provider  amiodarone (PACERONE) 200 MG tablet Take 200 mg by mouth daily.   Yes Historical Provider, MD  atorvastatin (LIPITOR) 20 MG tablet Take 20 mg by mouth daily.   Yes Historical Provider, MD  FERROUS SULFATE PO Take 1 tablet by mouth daily.     Yes Historical Provider, MD  hydrochlorothiazide (MICROZIDE) 12.5 MG capsule Take 12.5 mg by mouth daily.   Yes Historical Provider, MD  HYDROcodone-acetaminophen (NORCO) 7.5-325 MG per tablet Take 1 tablet by mouth 2 (two) times daily as needed  for moderate pain.   Yes Historical Provider, MD  levothyroxine (SYNTHROID, LEVOTHROID) 50 MCG tablet Take 50 mcg by mouth daily before breakfast.   Yes Historical Provider, MD  metoprolol tartrate (LOPRESSOR) 25 MG tablet Take 12.5 mg by mouth 2 (two) times daily. 1/2 tablet twice daily  07/22/10  Yes Thompson Grayer, MD  pantoprazole (PROTONIX) 40 MG tablet Take 40 mg by mouth 2 (two) times daily.     Yes Historical Provider, MD  diltiazem (CARDIZEM CD) 180 MG 24 hr capsule Take 1 capsule (180 mg total) by mouth daily. 08/02/10 08/02/11  Thompson Grayer, MD  hydrochlorothiazide (,MICROZIDE/HYDRODIURIL,) 12.5 MG capsule Take 1 capsule (12.5 mg total) by mouth daily. 09/18/10 09/18/11  Thompson Grayer, MD   Allergies  Allergen Reactions  . Nsaids Other (See Comments)    Sick   . Sulfonamide Derivatives Other (See Comments)    Sick     FAMILY HISTORY:  Family History  Problem Relation Age of Onset  . Coronary artery disease Mother   . Coronary artery disease Sister   . Stroke Father    SOCIAL HISTORY:  reports that she quit smoking about 3 years ago. Her smoking use included Cigarettes. She has a 114 pack-year smoking history. She has never used smokeless tobacco. She reports that she does not drink alcohol or use illicit drugs.  REVIEW OF SYSTEMS:  Unable to complete as pt is on bipap  SUBJECTIVE:   VITAL SIGNS: Temp:  [98 F (36.7 C)-98.4 F (36.9 C)] 98.4 F (36.9 C) (01/14 1900) Pulse Rate:  [66-85] 85 (01/14 2324) Resp:  [20-30] 30 (01/14 2324) BP: (129-186)/(61-78) 177/76 mmHg (01/14 2143) SpO2:  [94 %-97 %] 97 % (01/14 2324) Weight:  [125 lb (56.7 kg)] 125 lb (56.7 kg) (01/14 0700)  HEMODYNAMICS:    VENTILATOR SETTINGS:    INTAKE / OUTPUT: Intake/Output     01/14 0701 - 01/15 0700       Urine Occurrence 5 x   Stool Occurrence 2 x     PHYSICAL EXAMINATION: General:  Frail, elderly female in NAD on bipap Neuro:  Arouses, answers orientation questions appropriately, MAE    HEENT:  Mm pink/dry Cardiovascular:  s1s2 irr irr, no m/r/g Lungs:  resp's even/non-labored on bipap, lungs bilaterally diminished with basilar crackles Abdomen:  Round/soft, bsx4 hypoactive, mild tenderness to palpation  Musculoskeletal:  No acute deformitiy  Skin:  Warm/dry, trace LE edema  LABS:  CBC  Recent Labs Lab 03/14/13 2230 03/15/13 0800 03/16/13 0540  WBC 11.8* 10.3 8.9  HGB 12.1 11.5* 11.9*  HCT 36.5 34.9* 36.8  PLT 129* 129* 168   Coag's No results found for this basename: APTT, INR,  in the last 168 hours BMET  Recent Labs Lab 03/14/13 1357 03/14/13 2230 03/16/13 0540  NA 144 138 146  K 3.1* 2.7* 4.6  CL 96 97 109  CO2  30 28 26   BUN 60* 54* 29*  CREATININE 2.18* 1.95* 1.42*  GLUCOSE 115* 69* 87   Electrolytes  Recent Labs Lab 03/14/13 1357 03/14/13 2230 03/16/13 0540  CALCIUM 9.0 7.7* 8.3*   Sepsis Markers No results found for this basename: LATICACIDVEN, PROCALCITON, O2SATVEN,  in the last 168 hours ABG  Recent Labs Lab 03/17/13 0008  PHART 7.176*  PCO2ART 75.8*  PO2ART 200.0*   Liver Enzymes  Recent Labs Lab 03/14/13 1357 03/14/13 2230  AST 23 22  ALT 20 15  ALKPHOS 78 82  BILITOT 1.8* 0.9  ALBUMIN 3.2* 2.5*   Cardiac Enzymes No results found for this basename: TROPONINI, PROBNP,  in the last 168 hours Glucose No results found for this basename: GLUCAP,  in the last 168 hours  Imaging No results found.  ASSESSMENT / PLAN:  PULMONARY A: Acute Hypercarbic Respiratory Failure CAP COPD Former Tobacco Abuse P:   -bipap support -f/u abg in 1 hour -trend cxr -pulmonary hygiene  -oxygen to support sats 90-94% -NPO until respiratory status stabilizes  CARDIOVASCULAR A:  Atrial Fibrillation  CHF - concern for rate related decompensation, s/p 160 mg IV lasix x1 on 1/15 CAD / PVD P:  -continue amiodarone, lipitor, lopressor -anticoagulation per primary -daily weights  RENAL A:   At Risk AKI P:   -trend  BMP -lasix per primary   GASTROINTESTINAL A:   GERD P:   -PPI -NPO x meds while on Bipap -monitor stool for heme with hx of GIB  HEMATOLOGIC A:   Mild Anemia Coagulation  P:  -monitor H/H -new start on Eliquis per primary, hx of GIB in past on anticoagulation   INFECTIOUS A:   CAP  Post-Viral Syndrome / NVD P:   -abx as above -follow cultures as above  ENDOCRINE A:   Hypothyroidism    P:   -continue synthroid  NEUROLOGIC A:   Acute Encephalopathy - in setting of hypercarbic resp fx P:   -monitor mental status -supportive care -limit sedating medications  Noe Gens, NP-C [facility] Pgr: [phone] or [phone]     PCCM ATTENDING: I have interviewed and examined the patient and reviewed the database. I have formulated the assessment and plan as reflected in the note above with amendments made by me. She appears much improved with diuresis. LLL AS dz persists on CXR c/w PNA. Cont currnet abx. Cardiac mgmt per Dr Micheline Chapman, MD;  PCCM service; Mobile [phone]

## 2013-03-17 NOTE — ED Provider Notes (Signed)
Medical screening examination/treatment/procedure(s) were performed by non-physician practitioner and as supervising physician I was immediately available for consultation/collaboration.  EKG Interpretation    Date/Time:  Monday March 14 2013 13:38:06 EST Ventricular Rate:  86 PR Interval:    QRS Duration: 88 QT Interval:  480 QTC Calculation: 574 R Axis:   -14 Text Interpretation:  Atrial fibrillation with a competing junctional pacemaker ST \\T \ T wave abnormality, consider lateral ischemia or digitalis effect Prolonged QT Abnormal ECG ED PHYSICIAN INTERPRETATION AVAILABLE IN [facility] Confirmed by TEST, RECORD ([id number]) on 03/16/2013 8:28:14 AM             Jasper Riling. Alvino Chapel, MD 03/17/13 [phone]

## 2013-03-17 NOTE — Progress Notes (Addendum)
OT Cancellation Note  Patient Details Name: Brenda [name] MRN: [id number] DOB: June 03, 1931   Cancelled Treatment:    Reason Eval/Treat Not Completed: Medical issues which prohibited therapy. Elevated troponin  [name] [facility] 03/17/2013, 10:33 AM

## 2013-03-17 NOTE — Progress Notes (Signed)
Subjective:  Called to see patient aspiration and sudden onset of shortness of breath with desaturation in 80s associated with bilateral rhonchi and rales and was noted to be in acute pulmonary edema patient received total of 160 mg of IV Lasix with minimal diuresis patient was noted to be acidotic and being transferred to CCU patient placed on BiPAP states feels little better more awake now. Denies any chest pain  Objective:  Vital Signs in the last 24 hours: Temp:  [98 F (36.7 C)-98.4 F (36.9 C)] 98.4 F (36.9 C) (01/14 1900) Pulse Rate:  [66-85] 85 (01/14 2324) Resp:  [20-30] 30 (01/14 2324) BP: (129-186)/(61-78) 177/76 mmHg (01/14 2143) SpO2:  [94 %-97 %] 97 % (01/14 2324) Weight:  [56.7 kg (125 lb)] 56.7 kg (125 lb) (01/14 0700)  Intake/Output from previous day:   Intake/Output from this shift:    Physical Exam: Neck: JVD - 10 cm above sternal notch, no adenopathy, no carotid bruit and supple, symmetrical, trachea midline Lungs: Bilateral rhonchi and rales noted Heart: regular rate and rhythm, S1, S2 normal and Soft systolic murmur and S3 gallop noted Abdomen: soft, non-tender; bowel sounds normal; no masses,  no organomegaly Extremities: extremities normal, atraumatic, no cyanosis or edema  Lab Results:  Recent Labs  03/15/13 0800 03/16/13 0540  WBC 10.3 8.9  HGB 11.5* 11.9*  PLT 129* 168    Recent Labs  03/14/13 2230 03/16/13 0540  NA 138 146  K 2.7* 4.6  CL 97 109  CO2 28 26  GLUCOSE 69* 87  BUN 54* 29*  CREATININE 1.95* 1.42*   No results found for this basename: TROPONINI, CK, MB,  in the last 72 hours Hepatic Function Panel  Recent Labs  03/14/13 2230  PROT 6.3  ALBUMIN 2.5*  AST 22  ALT 15  ALKPHOS 82  BILITOT 0.9   No results found for this basename: CHOL,  in the last 72 hours No results found for this basename: PROTIME,  in the last 72 hours  Imaging: Imaging results have been reviewed and No results found.  Cardiac  Studies:  Assessment/Plan:  Acute pulmonary edema secondary to volume overload rule out ischemia Acute respiratory failure secondary to above Left lower lobe pneumonia Hypertension Chronic recurrent atrial fibrillation Hypothyroidism Chronic kidney disease Peripheral vascular disease History of congestive heart failure secondary to preserved LV systolic function Plan Start IV nitrates and Lasix as per orders Check chest x-ray ABGs Check EKG and serial enzymes BiPAP CCM consult Check labs Continue IV Rocephin and Zithromax  LOS: 3 days    Brenda [name] N 03/17/2013, 12:51 AM

## 2013-03-17 NOTE — Progress Notes (Signed)
Echo Lab  2D Echocardiogram completed.  [place], [place] 03/17/2013 5:19 PM

## 2013-03-17 NOTE — Progress Notes (Signed)
  Amiodarone Drug - Drug Interaction Consult Note  Recommendations: 1) QTc prolongation with Azithromycin and amiodarone. Recommend monitoring QTc interval with azithromycin and stopping azithromycin and monitoring patient if EKG abnormalities emerge.  It seems there are limited alternatives to replace azithromycin for coverage of CAP since Fluoroquinolones have similar interaction and other options may not be as effective. Consider establishing LOT on azithromycin. QTc this AM was 533.  2) Lipitor: Increased risk of rhabdomyolysis with simvastatin. Risk lower with Lipitor. Recommend continuing drug and monitoring patient.   3) Apixaban: Potential for increased anticoagulant effect. Recommend continuing drug and monitoring patient. Pharmacy will educate patient on this drug interaction.   Amiodarone is metabolized by the cytochrome P450 system and therefore has the potential to cause many drug interactions. Amiodarone has an average plasma half-life of 50 days (range 20 to 100 days).   There is potential for drug interactions to occur several weeks or months after stopping treatment and the onset of drug interactions may be slow after initiating amiodarone.   [x]  Statins: Increased risk of myopathy. Simvastatin- restrict dose to 20mg  daily. Other statins: counsel patients to report any muscle pain or weakness immediately.  [x]  Anticoagulants: Amiodarone can increase anticoagulant effect. Consider warfarin dose reduction. Patients should be monitored closely and the dose of anticoagulant altered accordingly, remembering that amiodarone levels take several weeks to stabilize.  []  Antiepileptics: Amiodarone can increase plasma concentration of phenytoin, the dose should be reduced. Note that small changes in phenytoin dose can result in large changes in levels. Monitor patient and counsel on signs of toxicity.  []  Beta blockers: increased risk of bradycardia, AV block and myocardial depression.  Sotalol - avoid concomitant use.  []   Calcium channel blockers (diltiazem and verapamil): increased risk of bradycardia, AV block and myocardial depression.  []   Cyclosporine: Amiodarone increases levels of cyclosporine. Reduced dose of cyclosporine is recommended.  []  Digoxin dose should be halved when amiodarone is started.  []  Diuretics: increased risk of cardiotoxicity if hypokalemia occurs.  []  Oral hypoglycemic agents (glyburide, glipizide, glimepiride): increased risk of hypoglycemia. Patient's glucose levels should be monitored closely when initiating amiodarone therapy.   [x]  Drugs that prolong the QT interval:  Torsades de pointes risk may be increased with concurrent use - avoid if possible.  Monitor QTc, also keep magnesium/potassium WNL if concurrent therapy can't be avoided. Marland Kitchen Antibiotics: e.g. fluoroquinolones, erythromycin. . Antiarrhythmics: e.g. quinidine, procainamide, disopyramide, sotalol. . Antipsychotics: e.g. phenothiazines, haloperidol.  . Lithium, tricyclic antidepressants, and methadone.  Thank You,  Albertina Parr, PharmD.  Clinical Pharmacist Pager [phone]

## 2013-03-17 NOTE — Progress Notes (Signed)
Subjective:  Patient denies any chest pain states breathing is improved denies palpitations. Noted to be in A. fib with RVR this morning. Troponin I. mildly elevated probably due to demand ischemia. Patient had the cardiac catheterization in April of 2014 which showed mild coronary artery disease.  Objective:  Vital Signs in the last 24 hours: Temp:  [97.6 F (36.4 C)-98.4 F (36.9 C)] 98.3 F (36.8 C) (01/15 0800) Pulse Rate:  [71-131] 88 (01/15 0955) Resp:  [20-40] 30 (01/15 0900) BP: (108-186)/(61-99) 122/65 mmHg (01/15 0955) SpO2:  [94 %-100 %] 97 % (01/15 0900) FiO2 (%):  [40 %] 40 % (01/15 0400) Weight:  [62.7 kg (138 lb 3.7 oz)] 62.7 kg (138 lb 3.7 oz) (01/15 0100)  Intake/Output from previous day: 01/14 0701 - 01/15 0700 In: 12.5 [I.V.:12.5] Out: 1750 [Urine:1750] Intake/Output from this shift: Total I/O In: 29 [I.V.:29] Out: 525 [Urine:525]  Physical Exam: Neck: no adenopathy, no carotid bruit and supple, symmetrical, trachea midline Lungs: Decreased breath sounds at bases with bilateral rhonchi and rales. Air entry improved Heart: irregularly irregular rhythm, S1, S2 normal and Soft systolic murmur and S3 gallop noted Abdomen: soft, non-tender; bowel sounds normal; no masses,  no organomegaly Extremities: extremities normal, atraumatic, no cyanosis or edema  Lab Results:  Recent Labs  03/16/13 0540 03/17/13 0241  WBC 8.9 16.8*  HGB 11.9* 13.8  PLT 168 206    Recent Labs  03/16/13 0540 03/17/13 0241  NA 146 148*  K 4.6 4.4  CL 109 104  CO2 26 29  GLUCOSE 87 153*  BUN 29* 20  CREATININE 1.42* 1.36*    Recent Labs  03/17/13 0241 03/17/13 0650  TROPONINI 0.49* 1.03*   Hepatic Function Panel  Recent Labs  03/17/13 0241  PROT 7.0  ALBUMIN 2.6*  AST 29  ALT 19  ALKPHOS 86  BILITOT 0.6   No results found for this basename: CHOL,  in the last 72 hours No results found for this basename: PROTIME,  in the last 72 hours  Imaging: Imaging  results have been reviewed and Dg Chest Port 1 View  03/17/2013   CLINICAL DATA:  Shortness of breath.  EXAM: PORTABLE CHEST - 1 VIEW  COMPARISON:  03/17/2013.  FINDINGS: Mediastinum and hilar structures are stable. The heart is enlarged with mild pulmonary vascular prominence and interstitial prominence. Left-sided pleural effusion noted. These findings are consistent with congestive heart failure. Superimposed left lower lobe pneumonia cannot be excluded. No pneumothorax. Surgical clips in the upper abdomen. No acute osseous abnormality.  IMPRESSION: 1. Persistent changes of congestive heart failure with pulmonary interstitial edema. 2. Left lower lobe infiltrate. This could represent focal pulmonary alveolar edema or pneumonia. Left-sided small pleural effusion present.   Electronically Signed   By: Taylor   On: 03/17/2013 07:39   Dg Chest Port 1 View  03/17/2013   CLINICAL DATA:  Acute shortness of breath.  Follow-up pneumonia.  EXAM: PORTABLE CHEST - 1 VIEW  COMPARISON:  Two-view chest x-ray 03/14/2013, 03/11/2011.  FINDINGS: Interval worsening of the airspace consolidation in the left lower lobe since the examination 3 days ago. Interval development of pulmonary venous hypertension and mild diffuse interstitial pulmonary edema. Small bilateral pleural effusions suspected. Cardiac silhouette upper normal in size for technique.  IMPRESSION: 1. Worsening left lower lobe pneumonia since the examination 3 days ago. 2. Development of pulmonary venous hypertension and mild interstitial pulmonary edema, query fluid overload.   Electronically Signed   By: Sherran Needs.D.  On: 03/17/2013 02:39    Cardiac Studies:  Assessment/Plan:  Resolving Acute pulmonary edema secondary to volume overload /Diastolic dysfunction Probably small type 2 non-Q-wave myocardial infarction secondary to demand ischemia Acute respiratory failure secondary to above  Left lower lobe pneumonia  Hypertension   Chronic recurrent atrial fibrillation with RVR Hypothyroidism  Chronic kidney disease  Peripheral vascular disease  History of congestive heart failure secondary to preserved LV systolic function Plan IV amiodarone as per orders hold by mouth amiodarone for now check EKG and serial enzymes Check 2-D echo  LOS: 3 days    Gilmore List N 03/17/2013, 10:42 AM

## 2013-03-17 NOTE — Progress Notes (Signed)
[id number]:  After obtaining report on pt, found pt in respiratory distress with resp rate 46 O2 sat 86% on 4L O2 via n.c.   Pt awake, but lethargic and not verbally responsive.   Elevated HOB to high fowler's. Gave ordereed dose of 40mg  IV lasix at this time.  Placed pt on NRB.  Called Dr. Terrence Dupont stat and notified him of pt's current status and possible flash pulmonary edema.  Ordered 40 more of lasix iv, ABG, stat Xray, and foley. Made Rapid Response aware of pt's condition and orders that are in place.   After following all initial orders.  Notified Dr. Terrence Dupont of ABG results and ordered another 80mg  IV lasix stat and 1 amp sodium bicarb, which was given.  Rapid response nurse at bedside getting ICU bed and assessing pt.  Report given to Katharine Look, RN on 2900.  Transferred pt to 2900.  Dr. Terrence Dupont came to floor to assist with transfer.  Pt transferred around [place].

## 2013-03-17 NOTE — Progress Notes (Signed)
CRITICAL VALUE ALERT  Critical value received: Troponin 0.49  Date of notification:  03/17/13  Time of notification: [id number]  Critical value read back:yes  Nurse who received alert: Newman Nickels RN  MD notified (1st page): Harwani  Time of first page: [phone]  Responding MD:  Terrence Dupont  Time MD responded:  [phone]

## 2013-03-17 NOTE — Progress Notes (Signed)
Bipap not needed at this time. RT will monitor and assess if needed at a later time. No distress noted. Pt on N/C, vitals WNL.

## 2013-03-17 NOTE — Progress Notes (Signed)
PT Cancellation Note  Patient Details Name: Brenda [name] MRN: [id number] DOB: December 15, 1931   Cancelled Treatment:    Reason Eval/Treat Not Completed: Medical issues which prohibited therapy (troponin elevated and not yet declining )   Lanetta Inch [facility] 03/17/2013, 7:22 AM [place]wyn Reach, [place]

## 2013-03-18 ENCOUNTER — Inpatient Hospital Stay (HOSPITAL_COMMUNITY): Payer: Medicare Other

## 2013-03-18 LAB — CBC
HCT: 37.9 % (ref 36.0–46.0)
Hemoglobin: 12.2 g/dL (ref 12.0–15.0)
MCH: 30.5 pg (ref 26.0–34.0)
MCHC: 32.2 g/dL (ref 30.0–36.0)
MCV: 94.8 fL (ref 78.0–100.0)
PLATELETS: 190 10*3/uL (ref 150–400)
RBC: 4 MIL/uL (ref 3.87–5.11)
RDW: 13.9 % (ref 11.5–15.5)
WBC: 10.6 10*3/uL — ABNORMAL HIGH (ref 4.0–10.5)

## 2013-03-18 LAB — LEGIONELLA ANTIGEN, URINE: LEGIONELLA ANTIGEN, URINE: NEGATIVE

## 2013-03-18 LAB — BASIC METABOLIC PANEL
BUN: 23 mg/dL (ref 6–23)
CHLORIDE: 99 meq/L (ref 96–112)
CO2: 33 mEq/L — ABNORMAL HIGH (ref 19–32)
CREATININE: 1.49 mg/dL — AB (ref 0.50–1.10)
Calcium: 8 mg/dL — ABNORMAL LOW (ref 8.4–10.5)
GFR calc Af Amer: 37 mL/min — ABNORMAL LOW (ref >90)
GFR calc non Af Amer: 32 mL/min — ABNORMAL LOW (ref >90)
Glucose, Bld: 85 mg/dL (ref 70–99)
Potassium: 3.8 mEq/L (ref 3.7–5.3)
Sodium: 144 mEq/L (ref 137–147)

## 2013-03-18 LAB — EXPECTORATED SPUTUM ASSESSMENT W REFEX TO RESP CULTURE

## 2013-03-18 LAB — EXPECTORATED SPUTUM ASSESSMENT W GRAM STAIN, RFLX TO RESP C

## 2013-03-18 LAB — TROPONIN I: Troponin I: 0.5 ng/mL (ref <0.30)

## 2013-03-18 MED ORDER — AMIODARONE HCL 200 MG PO TABS
200.0000 mg | ORAL_TABLET | Freq: Every day | ORAL | Status: DC
  Administered 2013-03-18 – 2013-03-24 (×7): 200 mg via ORAL
  Filled 2013-03-18 (×7): qty 1

## 2013-03-18 MED ORDER — LEVOFLOXACIN 500 MG PO TABS
500.0000 mg | ORAL_TABLET | Freq: Every day | ORAL | Status: DC
  Administered 2013-03-18 – 2013-03-20 (×3): 500 mg via ORAL
  Filled 2013-03-18 (×3): qty 1

## 2013-03-18 NOTE — Progress Notes (Signed)
SUBJ: No new complaints. No distress  OBJ:  Filed Vitals:   03/18/13 0600 03/18/13 0700 03/18/13 0758 03/18/13 1130  BP: 149/107 153/66    Pulse: 63 66 74   Temp:    98.4 F (36.9 C)  TempSrc:    Oral  Resp: 28 24    Height:      Weight:      SpO2: 99% 100% 94%    NAD No JVD L basilar rales RRR NABS No edema   I have reviewed all of today's lab results. Relevant abnormalities are discussed in the A/P section  CXR: improved edema pattern. Persistent LLL infiltrate with small L effusion  IMPRESSION: Acute resp distress and failure, resolving Pulmonary edema, resolved Probable LLL CAP, NOS  PLAN/REC: Change abx to PO levofloxacin and complete 7 day course Rest of mgmt per Dr Terrence Dupont   PCCM will sign off. Please call if we can be of further assistance  Merton Border, MD ; [facility] [phone].  After 5:30 PM or weekends, call [phone]

## 2013-03-18 NOTE — Evaluation (Signed)
Physical Therapy Evaluation Patient Details Name: Brenda [name] MRN: [id number] DOB: September 12, 1931 Today's Date: 03/18/2013 Time: [id number] PT Time Calculation (min): 25 min  PT Assessment / Plan / Recommendation History of Present Illness  80 y/o F admitted on 1/12 with abdominal pain / diarrhea and CAP.  Decompensated from resp standpoint 1/15 and tx to ICU on bipap  Clinical Impression  Pt very pleasant and eager to get stronger and return home. Pt with cues and assist for mobility and will benefit from acute therapy to address below deficits to return to PLOF and decrease burden of care. Recommend daily mobility with nursing. Pt able to maintain sats 89-95% on 2L throughout activity, HR 65-84. Will follow.      PT Assessment  Patient needs continued PT services    Follow Up Recommendations  Home health PT;Supervision for mobility/OOB    Does the patient have the potential to tolerate intense rehabilitation      Barriers to Discharge        Equipment Recommendations  None recommended by PT    Recommendations for Other Services     Frequency Min 3X/week    Precautions / Restrictions Precautions Precautions: Fall   Pertinent Vitals/Pain No pain      Mobility  Bed Mobility Overal bed mobility: Needs Assistance Bed Mobility: Supine to Sit Supine to sit: Min assist Transfers Overall transfer level: Needs assistance Transfers: Sit to/from Stand Sit to Stand: Min guard General transfer comment: cues for hand placement and safety Ambulation/Gait Ambulation/Gait assistance: Min guard Ambulation Distance (Feet): 30 Feet Assistive device: Rolling walker (2 wheeled) Gait Pattern/deviations: Step-through pattern;Decreased stride length;Trunk flexed Gait velocity interpretation: Below normal speed for age/gender    Exercises     PT Diagnosis: Difficulty walking;Generalized weakness  PT Problem List: Decreased activity tolerance;Decreased mobility;Decreased  strength;Decreased knowledge of use of DME PT Treatment Interventions: Gait training;DME instruction;Therapeutic activities;Functional mobility training;Therapeutic exercise;Patient/family education     PT Goals(Current goals can be found in the care plan section) Acute Rehab PT Goals Patient Stated Goal: return home with family PT Goal Formulation: With patient Time For Goal Achievement: 04/01/13 Potential to Achieve Goals: Good  Visit Information  Last PT Received On: 03/18/13 Assistance Needed: +1 History of Present Illness: 80 y/o F admitted on 1/12 with abdominal pain / diarrhea and CAP.  Decompensated from resp standpoint 1/15 and tx to ICU on bipap       Prior [place] expects to be discharged to:: Private residence Living Arrangements: Children Available Help at Discharge: Family;Available 24 hours/day Type of Home: House Home Access: Ramped entrance Home Layout: One level Home Equipment: Walker - 2 wheels;Bedside commode;Shower seat;Cane - single point Prior Function Level of Independence: Needs assistance Gait / Transfers Assistance Needed: mod I with and without RW ADL's / Homemaking Assistance Needed: family does all housework and cooking. Dgtr in law assists with bathing and dressing Communication Communication: HOH    Cognition  Cognition Arousal/Alertness: Awake/alert Behavior During Therapy: WFL for tasks assessed/performed Overall Cognitive Status: Within Functional Limits for tasks assessed    Extremity/Trunk Assessment Upper Extremity Assessment Upper Extremity Assessment: Generalized weakness Lower Extremity Assessment Lower Extremity Assessment: Generalized weakness Cervical / Trunk Assessment Cervical / Trunk Assessment: Normal   Balance    End of Session PT - End of Session Equipment Utilized During Treatment: Gait belt;Oxygen Activity Tolerance: Patient tolerated treatment well Patient left: in chair;with call  bell/phone within reach Nurse Communication: Mobility status  GP  Lanetta Inch Beth 03/18/2013, 8:01 AM Elwyn Reach, [place]

## 2013-03-18 NOTE — Progress Notes (Signed)
Subjective:  Patient denies any chest pain states breathing has improved denies any palpitation. Converted back into sinus rhythm and remains in sinus rhythm Objective:  Vital Signs in the last 24 hours: Temp:  [97.5 F (36.4 C)-98.4 F (36.9 C)] 98.4 F (36.9 C) (01/16 1130) Pulse Rate:  [58-136] 74 (01/16 0758) Resp:  [24-35] 24 (01/16 0700) BP: (88-153)/(34-107) 153/66 mmHg (01/16 0700) SpO2:  [94 %-100 %] 94 % (01/16 0758) Weight:  [60.2 kg (132 lb 11.5 oz)] 60.2 kg (132 lb 11.5 oz) (01/16 0500)  Intake/Output from previous day: 01/15 0701 - 01/16 0700 In: 1054 [P.O.:330; I.V.:724] Out: 1550 [Urine:1550] Intake/Output from this shift:    Physical Exam: Neck: no adenopathy, no carotid bruit, no JVD and supple, symmetrical, trachea midline Lungs: Decreased breath sound at bases with left more than right with left lung rhonchi Heart: regular rate and rhythm, S1, S2 normal and Soft systolic murmur and S3 gallop noted Abdomen: soft, non-tender; bowel sounds normal; no masses,  no organomegaly Extremities: extremities normal, atraumatic, no cyanosis or edema  Lab Results:  Recent Labs  03/17/13 0241 03/18/13 0318  WBC 16.8* 10.6*  HGB 13.8 12.2  PLT 206 190    Recent Labs  03/17/13 0241 03/18/13 0318  NA 148* 144  K 4.4 3.8  CL 104 99  CO2 29 33*  GLUCOSE 153* 85  BUN 20 23  CREATININE 1.36* 1.49*    Recent Labs  03/17/13 1620 03/18/13 0318  TROPONINI 0.58* 0.50*   Hepatic Function Panel  Recent Labs  03/17/13 0241  PROT 7.0  ALBUMIN 2.6*  AST 29  ALT 19  ALKPHOS 86  BILITOT 0.6   No results found for this basename: CHOL,  in the last 72 hours No results found for this basename: PROTIME,  in the last 72 hours  Imaging: Imaging results have been reviewed and Dg Chest Port 1 View  03/18/2013   CLINICAL DATA:  Assess airspace disease  EXAM: PORTABLE CHEST - 1 VIEW  COMPARISON:  Chest x-ray from yesterday  FINDINGS: Interval improvement in  pulmonary edema. There is still a small left pleural effusion with underlying lung opacity. Stable normal heart size. No pneumothorax.  IMPRESSION: 1. Resolved pulmonary edema. 2. Unchanged left lower lobe pneumonia (based on abdominal CT 03/14/2013) with small effusion.   Electronically Signed   By: Jorje Guild M.D.   On: 03/18/2013 06:34   Dg Chest Port 1 View  03/17/2013   CLINICAL DATA:  Shortness of breath.  EXAM: PORTABLE CHEST - 1 VIEW  COMPARISON:  03/17/2013.  FINDINGS: Mediastinum and hilar structures are stable. The heart is enlarged with mild pulmonary vascular prominence and interstitial prominence. Left-sided pleural effusion noted. These findings are consistent with congestive heart failure. Superimposed left lower lobe pneumonia cannot be excluded. No pneumothorax. Surgical clips in the upper abdomen. No acute osseous abnormality.  IMPRESSION: 1. Persistent changes of congestive heart failure with pulmonary interstitial edema. 2. Left lower lobe infiltrate. This could represent focal pulmonary alveolar edema or pneumonia. Left-sided small pleural effusion present.   Electronically Signed   By: [place]   On: 03/17/2013 07:39   Dg Chest Port 1 View  03/17/2013   CLINICAL DATA:  Acute shortness of breath.  Follow-up pneumonia.  EXAM: PORTABLE CHEST - 1 VIEW  COMPARISON:  Two-view chest x-ray 03/14/2013, 03/11/2011.  FINDINGS: Interval worsening of the airspace consolidation in the left lower lobe since the examination 3 days ago. Interval development of pulmonary venous hypertension  and mild diffuse interstitial pulmonary edema. Small bilateral pleural effusions suspected. Cardiac silhouette upper normal in size for technique.  IMPRESSION: 1. Worsening left lower lobe pneumonia since the examination 3 days ago. 2. Development of pulmonary venous hypertension and mild interstitial pulmonary edema, query fluid overload.   Electronically Signed   By: Evangeline Dakin M.D.   On:  03/17/2013 02:39    Cardiac Studies:  Assessment/Plan:  Resolving Acute pulmonary edema secondary to volume overload  /Diastolic dysfunction  Probably small type 2 non-Q-wave myocardial infarction secondary to demand ischemia  Acute respiratory failure secondary to above  Left lower lobe pneumonia  Hypertension  Chronic recurrent atrial fibrillation with RVR  Hypothyroidism  Chronic kidney disease  Peripheral vascular disease  History of congestive heart failure secondary to preserved LV systolic function Plan Change IV amiodarone to by mouth as per orders Transfer to step down Check 2-D echo Dr. Doylene Canard on-call for weekend  LOS: 4 days    Brenda [name] 03/18/2013, 12:00 PM

## 2013-03-19 ENCOUNTER — Encounter (HOSPITAL_COMMUNITY): Payer: Self-pay

## 2013-03-19 LAB — CBC
HCT: 37.8 % (ref 36.0–46.0)
Hemoglobin: 12.1 g/dL (ref 12.0–15.0)
MCH: 30.4 pg (ref 26.0–34.0)
MCHC: 32 g/dL (ref 30.0–36.0)
MCV: 95 fL (ref 78.0–100.0)
PLATELETS: 196 10*3/uL (ref 150–400)
RBC: 3.98 MIL/uL (ref 3.87–5.11)
RDW: 14 % (ref 11.5–15.5)
WBC: 8.8 10*3/uL (ref 4.0–10.5)

## 2013-03-19 MED ORDER — FUROSEMIDE 10 MG/ML IJ SOLN
40.0000 mg | Freq: Every day | INTRAMUSCULAR | Status: DC
  Filled 2013-03-19 (×2): qty 4

## 2013-03-19 MED ORDER — DILTIAZEM HCL 30 MG PO TABS
30.0000 mg | ORAL_TABLET | Freq: Four times a day (QID) | ORAL | Status: DC
  Administered 2013-03-19 – 2013-03-22 (×12): 30 mg via ORAL
  Filled 2013-03-19 (×15): qty 1

## 2013-03-19 MED ORDER — FUROSEMIDE 40 MG PO TABS: 40.0000 mg | ORAL_TABLET | Freq: Every day | ORAL | Status: DC

## 2013-03-19 NOTE — Progress Notes (Signed)
ANTICOAGULATION CONSULT NOTE - Follow Up  Pharmacy Consult for Eliquis Indication: atrial fibrillation  Assessment: 80 yo female with AFib and was on no anticoagulation prior to admit.  She has been on Amiodarone for rate control.  Her H/H is stable and she is without noted bleeding complications.  She was changed to apixaban for oral anticoagulation.   Her CHA2DS2-VASc Score for Atrial Fibrillation Stroke Risk is high at 6.  Age in Years  ?65 = +2  Sex  Female = +1  Congestive Heart Failure History  yes = +1  Hypertension History  yes = +1  Vascular Disease History  yes = +1  Her serum creatinine remains around 1.49; this may be her baseline since a year ago was 1.44 as well.  She is nearing 80 years of age (Feb. of this year) which along with her weight would require a dose reduction and she has a history for GI bleeding.   I will continue this current dose and defer to cardiology for risk/benefit assessment for dose increase.  Allergies  Allergen Reactions  . Nsaids Other (See Comments)    Sick   . Sulfonamide Derivatives Other (See Comments)    Sick    Patient Measurements: Height: 5\' 4"  (162.6 cm) Weight: 132 lb 4.4 oz (60 kg) IBW/kg (Calculated) : 54.7  Vital Signs: Temp: 98.2 F (36.8 C) (01/17 0805) Temp src: Oral (01/17 0805) BP: 143/62 mmHg (01/17 0805) Pulse Rate: 65 (01/17 0805)  Labs:  Recent Labs  03/17/13 0241 03/17/13 0650 03/17/13 1620 03/18/13 0318 03/19/13 0351  HGB 13.8  --   --  12.2 12.1  HCT 43.2  --   --  37.9 37.8  PLT 206  --   --  190 196  CREATININE 1.36*  --   --  1.49*  --   TROPONINI 0.49* 1.03* 0.58* 0.50*  --    Estimated Creatinine Clearance: 26.4 ml/min (by C-G formula based on Cr of 1.49).  Goal of Therapy:  Stroke risk reduction with known atrial fibrillation   Plan:  1).  Continue apixaban 2.5mg  twice daily 2).  Monitor for s/s of bleeding complications  Thank you,  Vivia Ewing, PharmD Clinical Pharmacist -  Resident Pager: [phone] Pharmacy: [phone] 03/19/2013 9:54 AM

## 2013-03-19 NOTE — Progress Notes (Signed)
NURSING PROGRESS NOTE  Brenda [name] [id number] Transfer Data: 03/19/2013 6:38 PM Attending Provider: Clent Demark, MD [id number] N, MD Code Status: Full   Brenda [name] is a 80 y.o. female patient transferred from [place]  -No acute distress noted.  -No complaints of shortness of breath.  -No complaints of chest pain.   Cardiac Monitoring: Box # 18 in place. Cardiac monitor yields:normal sinus rhythm.  Blood pressure 136/84, pulse 72, temperature 98.8 F (37.1 C), temperature source Oral, resp. rate 20, height 5\' 4"  (1.626 m), weight 60 kg (132 lb 4.4 oz), SpO2 98.00%.   IV Fluids:  IV in place, occlusive dsg intact without redness, IV cath forearm right, condition patent and no redness none.   Allergies:  Nsaids and Sulfonamide derivatives  Past Medical History:   has a past medical history of HYPERTENSION; CAROTID ARTERY DISEASE; COPD; GASTRITIS; Edema; Allergic rhinitis; PVD (peripheral vascular disease); Chronic diastolic heart failure due to hypertrophic obstructive cardiomyopathy; Atrial fibrillation; High cholesterol; Pneumonia (03/14/2013); Exertional shortness of breath; History of blood transfusion (12/2009); and Arthritis.  Past Surgical History:   has past surgical history that includes Cholecystectomy; Partial gastrectomy; Shoulder open rotator cuff repair (Right); Parotid gland tumor excision; Aorto-femoral Bypass Graft (Bilateral, 12/08/2009); Appendectomy; Cataract extraction w/ intraocular lens  implant, bilateral (Bilateral); Lumbar laminectomy (03/13/2011); Total abdominal hysterectomy; and Cardiac catheterization.  Social History:   reports that she quit smoking about 3 years ago. Her smoking use included Cigarettes. She has a 114 pack-year smoking history. She has never used smokeless tobacco. She reports that she does not drink alcohol or use illicit drugs.  Skin: groin red, covered with cream  Patient/Family orientated to room. Information packet given  to patient/family. Admission inpatient armband information verified with patient/family to include name and date of birth and placed on patient arm. Side rails up x 2, fall assessment and education completed with patient/family. Patient/family able to verbalize understanding of risk associated with falls and verbalized understanding to call for assistance before getting out of bed. Call light within reach. Patient/family able to voice and demonstrate understanding of unit orientation instructions.    Will continue to evaluate and treat per MD orders.  Joslyn Hy, MSN, RN, [employer]

## 2013-03-19 NOTE — Progress Notes (Signed)
MD Doylene Canard called RN back. New order of Cardizem 30mg  p.o. q6. RN told to give lopressor one hour early and Cardizem now. Will continue to monitor patient.

## 2013-03-19 NOTE — Progress Notes (Signed)
Central telemetry called RN to report that patient's rhythm has changed from NSR to WAP(wondering atrial pacemaker) at 1913. Calling on call MD now.

## 2013-03-19 NOTE — ED Provider Notes (Signed)
Medical screening examination/treatment/procedure(s) were performed by non-physician practitioner and as supervising physician I was immediately available for consultation/collaboration.  EKG Interpretation    Date/Time:  Monday March 14 2013 13:38:06 EST Ventricular Rate:  86 PR Interval:    QRS Duration: 88 QT Interval:  480 QTC Calculation: 574 R Axis:   -14 Text Interpretation:  Atrial fibrillation with a competing junctional pacemaker ST \\T \ T wave abnormality, consider lateral ischemia or digitalis effect Prolonged QT Abnormal ECG ED PHYSICIAN INTERPRETATION AVAILABLE IN [facility] Confirmed by TEST, RECORD ([id number]) on 03/16/2013 8:28:14 AM             Virgel Manifold, MD 03/19/13 2345

## 2013-03-19 NOTE — Progress Notes (Signed)
Subjective:  Feeling better. Afebrile. Mildly elevated Troponin I. Significant anteroseptal and inferoseptal hypokinesia with EF 35-40 % without significant valvular heart disease. Do not tolerate ensure feeding.  Objective:  Vital Signs in the last 24 hours: Temp:  [97.7 F (36.5 C)-98.5 F (36.9 C)] 98.5 F (36.9 C) (01/17 1206) Pulse Rate:  [61-77] 71 (01/17 1206) Cardiac Rhythm:  [-] Normal sinus rhythm (01/17 0800) Resp:  [18-27] 18 (01/17 1206) BP: (117-151)/(44-77) 135/66 mmHg (01/17 1206) SpO2:  [94 %-99 %] 99 % (01/17 1206) Weight:  [60 kg (132 lb 4.4 oz)] 60 kg (132 lb 4.4 oz) (01/17 0444)  Physical Exam: BP Readings from Last 1 Encounters:  03/19/13 135/66     Wt Readings from Last 1 Encounters:  03/19/13 60 kg (132 lb 4.4 oz)    Weight change: -0.2 kg (-7.1 oz)  HEENT: [place]/AT, Eyes- PERL, EOMI, Conjunctiva-Pink, Sclera-Non-icteric Neck: No JVD, No bruit, Trachea midline. Lungs:  Clear, Bilateral. Cardiac:  Regular rhythm, normal S1 and S2, no S3. II/VI systolic murmur. Abdomen:  Soft, non-tender. Extremities:  No edema present. No cyanosis. No clubbing. CNS: AxOx3, Cranial nerves grossly intact, moves all 4 extremities. Right handed. Skin: Warm and dry.   Intake/Output from previous day: 01/16 0701 - 01/17 0700 In: 530.2 [P.O.:360; I.V.:170.2] Out: 2026 [Urine:2025; Stool:1]    Lab Results: BMET    Component Value Date/Time   NA 144 03/18/2013 0318   K 3.8 03/18/2013 0318   CL 99 03/18/2013 0318   CO2 33* 03/18/2013 0318   GLUCOSE 85 03/18/2013 0318   BUN 23 03/18/2013 0318   CREATININE 1.49* 03/18/2013 0318   CALCIUM 8.0* 03/18/2013 0318   GFRNONAA 32* 03/18/2013 0318   GFRAA 37* 03/18/2013 0318   CBC    Component Value Date/Time   WBC 8.8 03/19/2013 0351   RBC 3.98 03/19/2013 0351   HGB 12.1 03/19/2013 0351   HCT 37.8 03/19/2013 0351   PLT 196 03/19/2013 0351   MCV 95.0 03/19/2013 0351   MCH 30.4 03/19/2013 0351   MCHC 32.0 03/19/2013 0351   RDW 14.0  03/19/2013 0351   LYMPHSABS 1.2 03/14/2013 2230   MONOABS 1.2* 03/14/2013 2230   EOSABS 0.0 03/14/2013 2230   BASOSABS 0.0 03/14/2013 2230   CARDIAC ENZYMES Lab Results  Component Value Date   CKTOTAL 40 02/08/2010   CKMB 0.9 02/08/2010   TROPONINI 0.50* 03/18/2013    Scheduled Meds: . amiodarone  200 mg Oral Daily  . antiseptic oral rinse  15 mL Mouth Rinse BID  . apixaban  2.5 mg Oral BID  . atorvastatin  20 mg Oral Daily  . ENSURE PLUS  237 mL Oral TID WC  . [START ON 03/20/2013] furosemide  40 mg Oral Daily  . levofloxacin  500 mg Oral Daily  . levothyroxine  50 mcg Oral QAC breakfast  . metoprolol tartrate  12.5 mg Oral BID  . pantoprazole  40 mg Oral BID  . potassium chloride  20 mEq Oral BID   Continuous Infusions: . sodium chloride 10 mL/hr at 03/17/13 0833   PRN Meds:.acetaminophen, levalbuterol  Assessment/Plan: Acute systolic congestive heart failure-improving.  Non-Q-wave myocardial infarction   Acute respiratory failure secondary to above-resolved Left lower lobe pneumonia  Hypertension  Chronic recurrent paroxysmal atrial fibrillation  Hypothyroidism  Chronic kidney disease  Peripheral vascular disease   Transfer to Tele-bed. DC ensure. Cardiac cath or medical treatment per Dr. Terrence Dupont.   LOS: 5 days    Dixie Dials  MD  03/19/2013,  3:59 PM

## 2013-03-20 LAB — BASIC METABOLIC PANEL
BUN: 29 mg/dL — ABNORMAL HIGH (ref 6–23)
CO2: 33 mEq/L — ABNORMAL HIGH (ref 19–32)
CREATININE: 1.82 mg/dL — AB (ref 0.50–1.10)
Calcium: 8.3 mg/dL — ABNORMAL LOW (ref 8.4–10.5)
Chloride: 98 mEq/L (ref 96–112)
GFR calc non Af Amer: 25 mL/min — ABNORMAL LOW (ref >90)
GFR, EST AFRICAN AMERICAN: 29 mL/min — AB (ref >90)
Glucose, Bld: 86 mg/dL (ref 70–99)
POTASSIUM: 4.4 meq/L (ref 3.7–5.3)
Sodium: 143 mEq/L (ref 137–147)

## 2013-03-20 LAB — CBC
HCT: 39.6 % (ref 36.0–46.0)
Hemoglobin: 12.8 g/dL (ref 12.0–15.0)
MCH: 30.5 pg (ref 26.0–34.0)
MCHC: 32.3 g/dL (ref 30.0–36.0)
MCV: 94.3 fL (ref 78.0–100.0)
Platelets: 205 10*3/uL (ref 150–400)
RBC: 4.2 MIL/uL (ref 3.87–5.11)
RDW: 14 % (ref 11.5–15.5)
WBC: 8.2 10*3/uL (ref 4.0–10.5)

## 2013-03-20 LAB — CULTURE, BLOOD (ROUTINE X 2)
CULTURE: NO GROWTH
Culture: NO GROWTH

## 2013-03-20 MED ORDER — ALBUMIN HUMAN 5 % IV SOLN
12.5000 g | Freq: Once | INTRAVENOUS | Status: AC
  Administered 2013-03-20: 12.5 g via INTRAVENOUS
  Filled 2013-03-20: qty 250

## 2013-03-20 MED ORDER — LEVOFLOXACIN 250 MG PO TABS
250.0000 mg | ORAL_TABLET | Freq: Every day | ORAL | Status: AC
  Administered 2013-03-21 – 2013-03-22 (×2): 250 mg via ORAL
  Filled 2013-03-20 (×2): qty 1

## 2013-03-20 MED ORDER — FUROSEMIDE 40 MG PO TABS
40.0000 mg | ORAL_TABLET | Freq: Every day | ORAL | Status: DC
  Administered 2013-03-21 – 2013-03-22 (×2): 40 mg via ORAL
  Filled 2013-03-20 (×2): qty 1

## 2013-03-20 NOTE — Progress Notes (Signed)
Subjective:  Feeling better. Mild renal dysfunction.  Objective:  Vital Signs in the last 24 hours: Temp:  [98 F (36.7 C)-98.8 F (37.1 C)] 98.1 F (36.7 C) (01/18 0508) Pulse Rate:  [59-72] 63 (01/18 0508) Cardiac Rhythm:  [-] Other (Comment) (01/18 0905) Resp:  [18-28] 20 (01/18 0508) BP: (104-136)/(61-84) 127/72 mmHg (01/18 0508) SpO2:  [94 %-99 %] 94 % (01/18 0508) Weight:  [56.7 kg (125 lb)] 56.7 kg (125 lb) (01/18 [id number])  Physical Exam: BP Readings from Last 1 Encounters:  03/20/13 127/72     Wt Readings from Last 1 Encounters:  03/20/13 56.7 kg (125 lb)    Weight change: -3.3 kg (-7 lb 4.4 oz)  HEENT: /AT, Eyes-Brown, PERL, EOMI, Conjunctiva-Pink, Sclera-Non-icteric Neck: No JVD, No bruit, Trachea midline. Lungs:  Clear, Bilateral. Cardiac:  Regular rhythm, normal S1 and S2, no S3. II/VI systolic murmur. Abdomen:  Soft, non-tender. Extremities:  No edema present. No cyanosis. No clubbing. CNS: AxOx3, Cranial nerves grossly intact, moves all 4 extremities. Right handed. Skin: Warm and dry.   Intake/Output from previous day: 01/17 0701 - 01/18 0700 In: 600 [P.O.:600] Out: 1175 [Urine:1175]    Lab Results: BMET    Component Value Date/Time   NA 143 03/20/2013 0642   K 4.4 03/20/2013 0642   CL 98 03/20/2013 0642   CO2 33* 03/20/2013 0642   GLUCOSE 86 03/20/2013 0642   BUN 29* 03/20/2013 0642   CREATININE 1.82* 03/20/2013 0642   CALCIUM 8.3* 03/20/2013 0642   GFRNONAA 25* 03/20/2013 0642   GFRAA 29* 03/20/2013 0642   CBC    Component Value Date/Time   WBC 8.2 03/20/2013 0642   RBC 4.20 03/20/2013 0642   HGB 12.8 03/20/2013 0642   HCT 39.6 03/20/2013 0642   PLT 205 03/20/2013 0642   MCV 94.3 03/20/2013 0642   MCH 30.5 03/20/2013 0642   MCHC 32.3 03/20/2013 0642   RDW 14.0 03/20/2013 0642   LYMPHSABS 1.2 03/14/2013 2230   MONOABS 1.2* 03/14/2013 2230   EOSABS 0.0 03/14/2013 2230   BASOSABS 0.0 03/14/2013 2230   CARDIAC ENZYMES Lab Results  Component Value Date   CKTOTAL 40 02/08/2010   CKMB 0.9 02/08/2010   TROPONINI 0.50* 03/18/2013    Scheduled Meds: . albumin human  12.5 g Intravenous Once  . amiodarone  200 mg Oral Daily  . antiseptic oral rinse  15 mL Mouth Rinse BID  . apixaban  2.5 mg Oral BID  . atorvastatin  20 mg Oral Daily  . diltiazem  30 mg Oral Q6H  . [START ON 03/21/2013] furosemide  40 mg Oral Daily  . levofloxacin  500 mg Oral Daily  . levothyroxine  50 mcg Oral QAC breakfast  . metoprolol tartrate  12.5 mg Oral BID  . pantoprazole  40 mg Oral BID  . potassium chloride  20 mEq Oral BID   Continuous Infusions: . sodium chloride 10 mL/hr at 03/17/13 0833   PRN Meds:.acetaminophen, levalbuterol  Assessment/Plan: NSTEMI Acute systolic congestive heart failure-improved Acute respiratory failure secondary to above-resolved  Left lower lobe pneumonia  Hypertension  Chronic recurrent paroxysmal atrial fibrillation  Hypothyroidism  Chronic kidney disease, II  Peripheral vascular disease  Change Lasix to PO  Increase activity.    LOS: 6 days    Dixie Dials  MD  03/20/2013, 9:52 AM

## 2013-03-21 LAB — BASIC METABOLIC PANEL
BUN: 28 mg/dL — ABNORMAL HIGH (ref 6–23)
CO2: 30 mEq/L (ref 19–32)
Calcium: 8.2 mg/dL — ABNORMAL LOW (ref 8.4–10.5)
Chloride: 102 mEq/L (ref 96–112)
Creatinine, Ser: 1.81 mg/dL — ABNORMAL HIGH (ref 0.50–1.10)
GFR, EST AFRICAN AMERICAN: 30 mL/min — AB (ref >90)
GFR, EST NON AFRICAN AMERICAN: 25 mL/min — AB (ref >90)
Glucose, Bld: 106 mg/dL — ABNORMAL HIGH (ref 70–99)
POTASSIUM: 4.4 meq/L (ref 3.7–5.3)
SODIUM: 144 meq/L (ref 137–147)

## 2013-03-21 LAB — CBC
HCT: 36.2 % (ref 36.0–46.0)
Hemoglobin: 11.6 g/dL — ABNORMAL LOW (ref 12.0–15.0)
MCH: 30.2 pg (ref 26.0–34.0)
MCHC: 32 g/dL (ref 30.0–36.0)
MCV: 94.3 fL (ref 78.0–100.0)
Platelets: 179 10*3/uL (ref 150–400)
RBC: 3.84 MIL/uL — ABNORMAL LOW (ref 3.87–5.11)
RDW: 14.1 % (ref 11.5–15.5)
WBC: 8.7 10*3/uL (ref 4.0–10.5)

## 2013-03-21 MED ORDER — POTASSIUM CHLORIDE CRYS ER 20 MEQ PO TBCR
20.0000 meq | EXTENDED_RELEASE_TABLET | Freq: Every day | ORAL | Status: DC
  Administered 2013-03-22 – 2013-03-24 (×3): 20 meq via ORAL
  Filled 2013-03-21 (×3): qty 1

## 2013-03-21 NOTE — Evaluation (Signed)
Occupational Therapy Evaluation and Discharge Patient Details Name: Brenda [name] MRN: [id number] DOB: 1931-08-25 Today's Date: 03/21/2013 Time: [id number] OT Time Calculation (min): 21 min  OT Assessment / Plan / Recommendation History of present illness 80 y/o F admitted on 1/12 with abdominal pain / diarrhea and CAP.  Decompensated from resp standpoint 1/15 and tx to ICU on bipap   Clinical Impression   The 80 yo pt admitted with above dx presents to acute OT at close to baseline and will have 24-hour S/A at home. No further OT needs at this time. Acute OT will sign off.    OT Assessment  Patient does not need any further OT services    Follow Up Recommendations  No OT follow up       Equipment Recommendations  None recommended by OT          Precautions / Restrictions Precautions Precautions: Fall Restrictions Weight Bearing Restrictions: No   Pertinent Vitals/Pain O2 92% on room air after ambulation    ADL  Toilet Transfer: Min guard Toilet Transfer Method: Sit to stand Toilet Transfer Equipment: Regular height toilet ADL Comments: Daughter in law reports providing assist w bathing and dressing at baseline.     Acute Rehab OT Goals Patient Stated Goal: return home with family OT Goal Formulation: With patient/family  Visit Information  Last OT Received On: 03/21/13 Assistance Needed: +1 History of Present Illness: 80 y/o F admitted on 1/12 with abdominal pain / diarrhea and CAP.  Decompensated from resp standpoint 1/15 and tx to ICU on bipap       Prior [place] expects to be discharged to:: Private residence Living Arrangements: Children Available Help at Discharge: Family;Available 24 hours/day Type of Home: House Home Access: Ramped entrance Home Layout: One level Home Equipment: Walker - 2 wheels;Bedside commode;Shower seat;Cane - single point Prior Function Level of Independence: Needs assistance Gait /  Transfers Assistance Needed: mod I with and without RW ADL's / Homemaking Assistance Needed: family does all housework and cooking. Dgtr in law assists with bathing and dressing Communication Communication: HOH         Vision/Perception Vision - History Baseline Vision: No visual deficits   Cognition  Cognition Arousal/Alertness: Awake/alert Behavior During Therapy: WFL for tasks assessed/performed Overall Cognitive Status: Within Functional Limits for tasks assessed    Extremity/Trunk Assessment Upper Extremity Assessment Upper Extremity Assessment: Generalized weakness Lower Extremity Assessment Lower Extremity Assessment: Generalized weakness     Mobility Bed Mobility Overal bed mobility: Needs Assistance Bed Mobility: Supine to Sit;Sit to Supine Supine to sit: Modified independent (Device/Increase time) Sit to supine: Min guard General bed mobility comments: Cues for bed positioning Transfers Overall transfer level: Needs assistance Equipment used: Rolling walker (2 wheeled) Transfers: Sit to/from Stand Sit to Stand: Min guard General transfer comment: cues for hand placement and safety        Balance Balance Overall balance assessment: Needs assistance Standing balance support: No upper extremity supported Standing balance-Leahy Scale: Fair   End of Session OT - End of Session Equipment Utilized During Treatment: Rolling walker Activity Tolerance: Patient tolerated treatment well Patient left: in bed;with bed alarm set;with family/visitor present       Almon Register  [id number]  03/21/2013, 11:45 AM

## 2013-03-21 NOTE — Progress Notes (Signed)
Subjective:  Patient denies any chest pain states breathing has improved. Now remains in sinus rhythm. Discussed with patient and family at length regarding 2-D echo result with markedly depressed LV systolic function as compared to prior echo and various options of treatment i.e. invasive left cath possible PCI versus medical management for now and repeating a 2-D echo as outpatient and if shows markedly depressed LV systolic function further evaluation as outpatient and agrees for medical management for now.  Objective:  Vital Signs in the last 24 hours: Temp:  [97.5 F (36.4 C)-98.3 F (36.8 C)] 97.5 F (36.4 C) (01/19 0533) Pulse Rate:  [55-75] 55 (01/19 1156) Resp:  [20] 20 (01/19 0533) BP: (110-122)/(56-72) 110/64 mmHg (01/19 1156) SpO2:  [92 %-98 %] 98 % (01/19 1156) Weight:  [59.8 kg (131 lb 13.4 oz)] 59.8 kg (131 lb 13.4 oz) (01/19 0533)  Intake/Output from previous day: 01/18 0701 - 01/19 0700 In: 600 [P.O.:600] Out: -  Intake/Output from this shift:    Physical Exam: Neck: no adenopathy, no carotid bruit, no JVD and supple, symmetrical, trachea midline Lungs: Decreased breath sounds at left base air entry improved Heart: regular rate and rhythm, S1, S2 normal and Soft systolic murmur noted Abdomen: soft, non-tender; bowel sounds normal; no masses,  no organomegaly Extremities: extremities normal, atraumatic, no cyanosis or edema  Lab Results:  Recent Labs  03/20/13 0642 03/21/13 0630  WBC 8.2 8.7  HGB 12.8 11.6*  PLT 205 179    Recent Labs  03/20/13 0642 03/21/13 0630  NA 143 144  K 4.4 4.4  CL 98 102  CO2 33* 30  GLUCOSE 86 106*  BUN 29* 28*  CREATININE 1.82* 1.81*   No results found for this basename: TROPONINI, CK, MB,  in the last 72 hours Hepatic Function Panel No results found for this basename: PROT, ALBUMIN, AST, ALT, ALKPHOS, BILITOT, BILIDIR, IBILI,  in the last 72 hours No results found for this basename: CHOL,  in the last 72 hours No  results found for this basename: PROTIME,  in the last 72 hours  Imaging: Imaging results have been reviewed and No results found.  Cardiac Studies:  Assessment/Plan:  Resolving Acute pulmonary edema secondary to volume overload  /Systolic dysfunction  Probably small type 2 non-Q-wave myocardial infarction secondary to demand ischemia  Acute respiratory failure secondary to above  Left lower lobe pneumonia  Hypertension  Chronic recurrent atrial fibrillation with RVR  Hypothyroidism  Chronic kidney disease  Peripheral vascular disease  History of congestive heart failure secondary to preserved LV systolic function  Plan As per orders Increase ambulation Possible discharge home tomorrow if stable  LOS: 7 days    Sylvanna Burggraf N 03/21/2013, 12:47 PM

## 2013-03-21 NOTE — Progress Notes (Signed)
PT Cancellation Note  Patient Details Name: CYLINDA NEILSEN MRN: [id number] DOB: May 02, 1931   Cancelled Treatment:    Reason Eval/Treat Not Completed: Fatigue/lethargy limiting ability to participate. Patient declined ambulation this afternoon after working with OT this morning. Will follow up with patient tomorrow   Jacqualyn Posey 03/21/2013, 2:33 PM

## 2013-03-22 LAB — CBC
HEMATOCRIT: 38.5 % (ref 36.0–46.0)
Hemoglobin: 12.4 g/dL (ref 12.0–15.0)
MCH: 30.2 pg (ref 26.0–34.0)
MCHC: 32.2 g/dL (ref 30.0–36.0)
MCV: 93.7 fL (ref 78.0–100.0)
Platelets: 200 10*3/uL (ref 150–400)
RBC: 4.11 MIL/uL (ref 3.87–5.11)
RDW: 14.1 % (ref 11.5–15.5)
WBC: 10.5 10*3/uL (ref 4.0–10.5)

## 2013-03-22 MED ORDER — FUROSEMIDE 40 MG PO TABS
40.0000 mg | ORAL_TABLET | Freq: Two times a day (BID) | ORAL | Status: DC
  Administered 2013-03-22 – 2013-03-23 (×2): 40 mg via ORAL
  Filled 2013-03-22 (×4): qty 1

## 2013-03-22 NOTE — Progress Notes (Signed)
Subjective:  Patient denies any chest pain complains of mild shortness of breath states feels weak don't feel like going home today.  Objective:  Vital Signs in the last 24 hours: Temp:  [97.9 F (36.6 C)-98.2 F (36.8 C)] 98.2 F (36.8 C) (01/20 1351) Pulse Rate:  [58-66] 66 (01/20 1351) Resp:  [16-18] 16 (01/20 1351) BP: (104-132)/(55-70) 132/55 mmHg (01/20 1742) SpO2:  [95 %-98 %] 98 % (01/20 1351) Weight:  [58.9 kg (129 lb 13.6 oz)] 58.9 kg (129 lb 13.6 oz) (01/20 0324)  Intake/Output from previous day: 01/19 0701 - 01/20 0700 In: 120 [P.O.:120] Out: -  Intake/Output from this shift: Total I/O In: 360 [P.O.:360] Out: -   Physical Exam: Neck: no adenopathy, no carotid bruit, no JVD and supple, symmetrical, trachea midline Lungs: Decreased breath sound at left base with occasional rhonchi Heart: regular rate and rhythm, S1, S2 normal and Soft systolic murmur noted Abdomen: soft, non-tender; bowel sounds normal; no masses,  no organomegaly Extremities: extremities normal, atraumatic, no cyanosis or edema  Lab Results:  Recent Labs  03/21/13 0630 03/22/13 0710  WBC 8.7 10.5  HGB 11.6* 12.4  PLT 179 200    Recent Labs  03/20/13 0642 03/21/13 0630  NA 143 144  K 4.4 4.4  CL 98 102  CO2 33* 30  GLUCOSE 86 106*  BUN 29* 28*  CREATININE 1.82* 1.81*   No results found for this basename: TROPONINI, CK, MB,  in the last 72 hours Hepatic Function Panel No results found for this basename: PROT, ALBUMIN, AST, ALT, ALKPHOS, BILITOT, BILIDIR, IBILI,  in the last 72 hours No results found for this basename: CHOL,  in the last 72 hours No results found for this basename: PROTIME,  in the last 72 hours  Imaging: Imaging results have been reviewed and No results found.  Cardiac Studies:  Assessment/Plan:  Resolving Acute pulmonary edema secondary to volume overload  /Systolic dysfunction  Probably small type 2 non-Q-wave myocardial infarction secondary to demand  ischemia  Acute respiratory failure secondary to above  Left lower lobe pneumonia  Hypertension  Chronic recurrent atrial fibrillation with RVR  Hypothyroidism  Chronic kidney disease  Peripheral vascular disease  History of congestive heart failure secondary to preserved LV systolic function in past Plan As per orders  LOS: 8 days    Melaya Hoselton N 03/22/2013, 6:59 PM

## 2013-03-22 NOTE — Progress Notes (Signed)
Physical Therapy Treatment Patient Details Name: Brenda [name] MRN: [id number] DOB: 01-11-1932 Today's Date: 03/22/2013 Time: [id number] PT Time Calculation (min): 23 min  PT Assessment / Plan / Recommendation  History of Present Illness 80 y/o F admitted on 1/12 with abdominal pain / diarrhea and CAP.  Decompensated from resp standpoint 1/15 and tx to ICU on bipap   PT Comments   Pt required encouragement to participate in therapy.  States she feels very weak & explained to her that's why it's very important she participates with therapy.  Overall pt moves fairly well but fatigues quickly.   Encouraged pt to increase mobility & rather than use BSC to ambulate to bathroom with Nsing.  Pt agreeable.  Cont with current POC to maximize functional mobility prior to d/c home.     Follow Up Recommendations  Home health PT;Supervision for mobility/OOB     Does the patient have the potential to tolerate intense rehabilitation     Barriers to Discharge        Equipment Recommendations  None recommended by PT    Recommendations for Other Services    Frequency Min 3X/week   Progress towards PT Goals Progress towards PT goals: Progressing toward goals  Plan Current plan remains appropriate    Precautions / Restrictions Restrictions Weight Bearing Restrictions: No       Mobility  Bed Mobility Overal bed mobility: Modified Independent Bed Mobility: Supine to Sit Supine to sit: Modified independent (Device/Increase time) General bed mobility comments: Incr time but no physical (A) needed.   Transfers Overall transfer level: Needs assistance Equipment used: Rolling walker (2 wheeled) Transfers: Sit to/from Stand Sit to Stand: Min guard General transfer comment: cues for hand placement Ambulation/Gait Ambulation/Gait assistance: Min guard Ambulation Distance (Feet): 50 Feet Assistive device: Rolling walker (2 wheeled) Gait Pattern/deviations: Step-through pattern;Decreased stride  length;Shuffle Gait velocity: decreased Gait velocity interpretation: Below normal speed for age/gender General Gait Details: Pt with very small shuffling steps but steady.  Fatigues quickly.      Exercises General Exercises - Lower Extremity Ankle Circles/Pumps: AROM;Both;10 reps Long Arc Quad: AROM;Strengthening;Both;10 reps Hip ABduction/ADduction: AROM;Strengthening;10 reps;Both Straight Leg Raises: AROM;Strengthening;Both;10 reps Hip Flexion/Marching: AROM;Strengthening;Both;10 reps Toe Raises: 10 reps Heel Raises: 10 reps    PT Goals (current goals can now be found in the care plan section) Acute Rehab PT Goals Patient Stated Goal: return home with family PT Goal Formulation: With patient Time For Goal Achievement: 04/01/13 Potential to Achieve Goals: Good  Visit Information  Last PT Received On: 03/22/13 Assistance Needed: +1 History of Present Illness: 80 y/o F admitted on 1/12 with abdominal pain / diarrhea and CAP.  Decompensated from resp standpoint 1/15 and tx to ICU on bipap    Subjective Data  Patient Stated Goal: return home with family   Cognition  Cognition Arousal/Alertness: Awake/alert Behavior During Therapy: WFL for tasks assessed/performed;Flat affect Overall Cognitive Status: Within Functional Limits for tasks assessed    Balance     End of Session PT - End of Session Equipment Utilized During Treatment: Oxygen Activity Tolerance: Patient tolerated treatment well;Patient limited by fatigue Patient left: in chair;with call bell/phone within reach Nurse Communication: Mobility status   GP     Sena Hitch 03/22/2013, 12:06 PM  Sarajane Marek, [place] 03/22/2013

## 2013-03-23 LAB — BASIC METABOLIC PANEL
BUN: 30 mg/dL — AB (ref 6–23)
CHLORIDE: 100 meq/L (ref 96–112)
CO2: 31 meq/L (ref 19–32)
CREATININE: 2.44 mg/dL — AB (ref 0.50–1.10)
Calcium: 8.6 mg/dL (ref 8.4–10.5)
GFR calc Af Amer: 21 mL/min — ABNORMAL LOW (ref >90)
GFR calc non Af Amer: 18 mL/min — ABNORMAL LOW (ref >90)
GLUCOSE: 85 mg/dL (ref 70–99)
Potassium: 4.4 mEq/L (ref 3.7–5.3)
Sodium: 142 mEq/L (ref 137–147)

## 2013-03-23 LAB — CBC
HEMATOCRIT: 39.3 % (ref 36.0–46.0)
HEMOGLOBIN: 12.5 g/dL (ref 12.0–15.0)
MCH: 30 pg (ref 26.0–34.0)
MCHC: 31.8 g/dL (ref 30.0–36.0)
MCV: 94.2 fL (ref 78.0–100.0)
PLATELETS: 172 10*3/uL (ref 150–400)
RBC: 4.17 MIL/uL (ref 3.87–5.11)
RDW: 14.1 % (ref 11.5–15.5)
WBC: 11.1 10*3/uL — AB (ref 4.0–10.5)

## 2013-03-23 MED ORDER — ONDANSETRON HCL 4 MG/2ML IJ SOLN: 4.0000 mg | Freq: Four times a day (QID) | INTRAMUSCULAR | Status: DC | PRN

## 2013-03-23 MED ORDER — FUROSEMIDE 40 MG PO TABS
40.0000 mg | ORAL_TABLET | Freq: Every day | ORAL | Status: DC
  Administered 2013-03-24: 40 mg via ORAL
  Filled 2013-03-23: qty 1

## 2013-03-23 NOTE — Progress Notes (Signed)
Physical Therapy Treatment Patient Details Name: Brenda [name] MRN: [id number] DOB: [birth year] Today's Date: 03/23/2013 Time: [id number] PT Time Calculation (min): 24 min  PT Assessment / Plan / Recommendation  History of Present Illness 80 y/o F admitted on 1/12 with abdominal pain / diarrhea and CAP.  Decompensated from resp standpoint 1/15 and tx to ICU on bipap   PT Comments   Pt progressing with mobility and RW. Pt continues to fatigue quickly and demo decreased activity tolerance. Pt will require supervision when ambulating to reduce risk of falls. Will cont to follow per POC and recommend HHPT upon acute D/C. Pt c/o nausea today; RN notified.   Follow Up Recommendations  Home health PT;Supervision for mobility/OOB     Does the patient have the potential to tolerate intense rehabilitation     Barriers to Discharge        Equipment Recommendations  None recommended by PT    Recommendations for Other Services    Frequency Min 3X/week   Progress towards PT Goals Progress towards PT goals: Progressing toward goals  Plan Current plan remains appropriate    Precautions / Restrictions Precautions Precautions: Fall Restrictions Weight Bearing Restrictions: No   Pertinent Vitals/Pain VSS.   Mobility  Bed Mobility Overal bed mobility: Modified Independent Bed Mobility: Supine to Sit Supine to sit: Modified independent (Device/Increase time);HOB elevated General bed mobility comments: Incr time but no physical (A) needed.   Transfers Overall transfer level: Needs assistance Equipment used: Rolling walker (2 wheeled) Transfers: Sit to/from Stand Sit to Stand: Min guard General transfer comment: cues for hand placement Ambulation/Gait Ambulation/Gait assistance: Min guard Ambulation Distance (Feet): 80 Feet Assistive device: Rolling walker (2 wheeled) Gait Pattern/deviations: Step-through pattern;Decreased stride length;Trunk flexed Gait velocity: decreased Gait  velocity interpretation: Below normal speed for age/gender General Gait Details: cues to increase step length and look up with upright posture when ambulating; pt fatigues quickly and requests to return to room; min cues for RW management    Exercises General Exercises - Lower Extremity Ankle Circles/Pumps: AROM;Both;10 reps Long Arc Quad: AROM;Strengthening;Both;10 reps Hip ABduction/ADduction: AROM;Strengthening;10 reps;Both Hip Flexion/Marching: AROM;Strengthening;Both;10 reps;Seated   PT Diagnosis:    PT Problem List:   PT Treatment Interventions:     PT Goals (current goals can now be found in the care plan section) Acute Rehab PT Goals Patient Stated Goal: return home with family PT Goal Formulation: With patient Time For Goal Achievement: 04/01/13 Potential to Achieve Goals: Good  Visit Information  Last PT Received On: 03/23/13 Assistance Needed: +1 History of Present Illness: 80 y/o F admitted on 1/12 with abdominal pain / diarrhea and CAP.  Decompensated from resp standpoint 1/15 and tx to ICU on bipap    Subjective Data  Subjective: Pt lying supine; initially declining PT but after educated on importance of OOB activities for pulmonary health; pt was agreeable  Patient Stated Goal: return home with family   Cognition  Cognition Arousal/Alertness: Awake/alert Behavior During Therapy: WFL for tasks assessed/performed;Flat affect Overall Cognitive Status: Within Functional Limits for tasks assessed    Balance  Balance Overall balance assessment: Needs assistance Sitting-balance support: Feet unsupported;No upper extremity supported Sitting balance-Leahy Scale: Good Standing balance support: During functional activity;Bilateral upper extremity supported Standing balance-Leahy Scale: Fair  End of Session PT - End of Session Equipment Utilized During Treatment: Oxygen Activity Tolerance: Patient tolerated treatment well;Patient limited by fatigue Patient left: in  chair;with call bell/phone within reach Nurse Communication: Mobility status   GP  Elie Confer Williamson, [place] 03/23/2013, 5:40 PM

## 2013-03-23 NOTE — Progress Notes (Signed)
Subjective:  Patient complains of generalized weakness and leg cramps. No chest pain states breathing is improved after increasing Lasix dose. Creatinine trending up. No further episodes of A. fib with RVR  Objective:  Vital Signs in the last 24 hours: Temp:  [97.7 F (36.5 C)-98.2 F (36.8 C)] 97.7 F (36.5 C) (01/21 0412) Pulse Rate:  [55-66] 58 (01/21 0900) Resp:  [16-18] 18 (01/21 0412) BP: (99-132)/(55-63) 104/55 mmHg (01/21 0900) SpO2:  [96 %-98 %] 96 % (01/21 0412) Weight:  [59.8 kg (131 lb 13.4 oz)] 59.8 kg (131 lb 13.4 oz) (01/21 0240)  Intake/Output from previous day: 01/20 0701 - 01/21 0700 In: 360 [P.O.:360] Out: -  Intake/Output from this shift:    Physical Exam: Neck: no adenopathy, no carotid bruit, no JVD and supple, symmetrical, trachea midline Lungs: Decreased breath sound at left base Heart: regular rate and rhythm, S1, S2 normal and Soft systolic murmur noted Abdomen: soft, non-tender; bowel sounds normal; no masses,  no organomegaly Extremities: extremities normal, atraumatic, no cyanosis or edema  Lab Results:  Recent Labs  03/22/13 0710 03/23/13 0642  WBC 10.5 11.1*  HGB 12.4 12.5  PLT 200 172    Recent Labs  03/21/13 0630 03/23/13 0642  NA 144 142  K 4.4 4.4  CL 102 100  CO2 30 31  GLUCOSE 106* 85  BUN 28* 30*  CREATININE 1.81* 2.44*   No results found for this basename: TROPONINI, CK, MB,  in the last 72 hours Hepatic Function Panel No results found for this basename: PROT, ALBUMIN, AST, ALT, ALKPHOS, BILITOT, BILIDIR, IBILI,  in the last 72 hours No results found for this basename: CHOL,  in the last 72 hours No results found for this basename: PROTIME,  in the last 72 hours  Imaging: Imaging results have been reviewed and No results found.  Cardiac Studies:  Assessment/Plan:  Resolving Acute pulmonary edema secondary to volume overload  /Systolic dysfunction  Probably small type 2 non-Q-wave myocardial infarction secondary  to demand ischemia  Acute respiratory failure secondary to above  Left lower lobe pneumonia  Hypertension  Chronic recurrent atrial fibrillation with RVR  Hypothyroidism  Acute on Chronic kidney disease multifactorial Peripheral vascular disease  History of congestive heart failure secondary to preserved LV systolic function in past Plan Continue present management Increase ambulation Check chest x-ray in a.m. Decrease Lasix dose as per orders  LOS: 9 days    Tj Kitchings N 03/23/2013, 12:34 PM

## 2013-03-24 ENCOUNTER — Inpatient Hospital Stay (HOSPITAL_COMMUNITY): Payer: Medicare Other

## 2013-03-24 LAB — CBC
HEMATOCRIT: 39.6 % (ref 36.0–46.0)
Hemoglobin: 12.7 g/dL (ref 12.0–15.0)
MCH: 30.5 pg (ref 26.0–34.0)
MCHC: 32.1 g/dL (ref 30.0–36.0)
MCV: 95.2 fL (ref 78.0–100.0)
Platelets: 169 10*3/uL (ref 150–400)
RBC: 4.16 MIL/uL (ref 3.87–5.11)
RDW: 14.2 % (ref 11.5–15.5)
WBC: 11.3 10*3/uL — AB (ref 4.0–10.5)

## 2013-03-24 LAB — BASIC METABOLIC PANEL
BUN: 37 mg/dL — AB (ref 6–23)
CHLORIDE: 100 meq/L (ref 96–112)
CO2: 33 mEq/L — ABNORMAL HIGH (ref 19–32)
CREATININE: 3.04 mg/dL — AB (ref 0.50–1.10)
Calcium: 8.8 mg/dL (ref 8.4–10.5)
GFR calc non Af Amer: 14 mL/min — ABNORMAL LOW (ref >90)
GFR, EST AFRICAN AMERICAN: 16 mL/min — AB (ref >90)
Glucose, Bld: 95 mg/dL (ref 70–99)
Potassium: 4.9 mEq/L (ref 3.7–5.3)
Sodium: 144 mEq/L (ref 137–147)

## 2013-03-24 MED ORDER — FUROSEMIDE 40 MG PO TABS: 40.0000 mg | ORAL_TABLET | Freq: Every day | ORAL | Status: AC

## 2013-03-24 MED ORDER — WARFARIN SODIUM 2 MG PO TABS: 2.0000 mg | ORAL_TABLET | Freq: Every day | ORAL | Status: DC

## 2013-03-24 MED ORDER — CARVEDILOL 3.125 MG PO TABS: 3.1250 mg | ORAL_TABLET | Freq: Two times a day (BID) | ORAL | Status: DC

## 2013-03-24 MED ORDER — POTASSIUM CHLORIDE CRYS ER 20 MEQ PO TBCR: 10.0000 meq | EXTENDED_RELEASE_TABLET | Freq: Every day | ORAL | Status: DC

## 2013-03-24 MED ORDER — PANTOPRAZOLE SODIUM 40 MG PO TBEC: 40.0000 mg | DELAYED_RELEASE_TABLET | Freq: Every day | ORAL | Status: AC

## 2013-03-24 NOTE — Discharge Summary (Signed)
  Discharge summary dictated on 03/24/2013 dictation number is [phone]

## 2013-03-24 NOTE — Discharge Instructions (Signed)
Information on my medicine - ELIQUIS (apixaban)  This medication education was reviewed with me or my healthcare representative as part of my discharge preparation.  The pharmacist that spoke with me during my hospital stay was:  Lavenia Atlas, [facility]  Why was Eliquis prescribed for you? Eliquis was prescribed for you to reduce the risk of forming blood clots that can cause a stroke if you have a medical condition called atrial fibrillation (a type of irregular heartbeat) OR to reduce the risk of a blood clots forming after orthopedic surgery.  What do You need to know about Eliquis ? Take your Eliquis TWICE DAILY - one tablet in the morning and one tablet in the evening with or without food.  It would be best to take the doses about the same time each day.  If you have difficulty swallowing the tablet whole please discuss with your pharmacist how to take the medication safely.  Take Eliquis exactly as prescribed by your doctor and DO NOT stop taking Eliquis without talking to the doctor who prescribed the medication.  Stopping may increase your risk of developing a new clot or stroke.  Refill your prescription before you run out.  After discharge, you should have regular check-up appointments with your healthcare provider that is prescribing your Eliquis.  In the future your dose may need to be changed if your kidney function or weight changes by a significant amount or as you get older.  What do you do if you miss a dose? If you miss a dose, take it as soon as you remember on the same day and resume taking twice daily.  Do not take more than one dose of ELIQUIS at the same time.  Important Safety Information A possible side effect of Eliquis is bleeding. You should call your healthcare provider right away if you experience any of the following:   Bleeding from an injury or your nose that does not stop.   Unusual colored urine (red or dark brown) or unusual colored stools (red or  black).   Unusual bruising for unknown reasons.   A serious fall or if you hit your head (even if there is no bleeding).  Some medicines may interact with Eliquis and might increase your risk of bleeding or clotting while on Eliquis. To help avoid this, consult your healthcare provider or pharmacist prior to using any new prescription or non-prescription medications, including herbals, vitamins, non-steroidal anti-inflammatory drugs (NSAIDs) and supplements.  This website has more information on Eliquis (apixaban): [email or web address].no.    Pneumonia, Adult Pneumonia is an infection of the lungs. It may be caused by a germ (virus or bacteria). Some types of pneumonia can spread easily from person to person. This can happen when you cough or sneeze. HOME CARE  Only take medicine as told by your doctor.  Take your medicine (antibiotics) as told. Finish it even if you start to feel better.  Do not smoke.  You may use a vaporizer or humidifier in your room. This can help loosen thick spit (mucus).  Sleep so you are almost sitting up (semi-upright). This helps reduce coughing.  Rest. A shot (vaccine) can help prevent pneumonia. Shots are often advised for:  People over 66 years old.  Patients on chemotherapy.  People with long-term (chronic) lung problems.  People with immune system problems. GET HELP RIGHT AWAY IF:   You are getting worse.  You cannot control your cough, and you are losing sleep.  You cough up blood.  Your pain gets worse, even with medicine.  You have a fever.  Any of your problems are getting worse, not better.  You have shortness of breath or chest pain. MAKE SURE YOU:   Understand these instructions.  Will watch your condition.  Will get help right away if you are not doing well or get worse. Document Released: 08/06/2007 Document Revised: 05/12/2011 Document Reviewed: 05/10/2010 [facility] Patient Information 2014 [place].

## 2013-03-24 NOTE — Progress Notes (Signed)
Nsg Discharge Note  Admit Date:  03/14/2013 Discharge date: 03/24/2013   Brenda [name] to be D/C'd Home per MD order.  AVS completed.  Copy for chart, and copy for patient signed, and dated. Patient/caregiver able to verbalize understanding.  Discharge Medication:   Medication List    STOP taking these medications       diltiazem 180 MG 24 hr capsule  Commonly known as:  CARDIZEM CD     hydrochlorothiazide 12.5 MG capsule  Commonly known as:  MICROZIDE     HYDROcodone-acetaminophen 7.5-325 MG per tablet  Commonly known as:  NORCO     metoprolol tartrate 25 MG tablet  Commonly known as:  LOPRESSOR      TAKE these medications       amiodarone 200 MG tablet  Commonly known as:  PACERONE  Take 200 mg by mouth daily.     atorvastatin 20 MG tablet  Commonly known as:  LIPITOR  Take 20 mg by mouth daily.     carvedilol 3.125 MG tablet  Commonly known as:  COREG  Take 1 tablet (3.125 mg total) by mouth 2 (two) times daily with a meal.     FERROUS SULFATE PO  Take 1 tablet by mouth daily.     furosemide 40 MG tablet  Commonly known as:  LASIX  Take 1 tablet (40 mg total) by mouth daily.     levothyroxine 50 MCG tablet  Commonly known as:  SYNTHROID, LEVOTHROID  Take 50 mcg by mouth daily before breakfast.     pantoprazole 40 MG tablet  Commonly known as:  PROTONIX  Take 1 tablet (40 mg total) by mouth daily.     potassium chloride SA 20 MEQ tablet  Commonly known as:  K-DUR,KLOR-CON  Take 0.5 tablets (10 mEq total) by mouth daily.     warfarin 2 MG tablet  Commonly known as:  COUMADIN  Take 1 tablet (2 mg total) by mouth daily at 6 PM.        Discharge Assessment: Filed Vitals:   03/24/13 1456  BP:   Pulse: 112  Temp:   Resp:    Skin clean, dry and intact without evidence of skin break down, no evidence of skin tears noted. IV catheter discontinued intact. Site without signs and symptoms of complications - no redness or edema noted at insertion site,  patient denies c/o pain - only slight tenderness at site.  Dressing with slight pressure applied.  D/c Instructions-Education: Discharge instructions given to patient/family with verbalized understanding. D/c education completed with patient/family including follow up instructions, medication list, d/c activities limitations if indicated, with other d/c instructions as indicated by MD - patient able to verbalize understanding, all questions fully answered. Education printed from exit care: COPD, PVD, and PVD. Patient instructed to return to ED, call 911, or call MD for any changes in condition.  Patient escorted via [place], and D/C home via private auto.  Shaunessy Dobratz, Elissa Hefty, RN 03/24/2013 5:23 PM

## 2013-03-25 NOTE — Discharge Summary (Signed)
[name], [name]              ACCOUNT NO.:  [id number]  MEDICAL RECORD NO.:  [id number]  LOCATION:  [id number]                        FACILITY:  [facility]  PHYSICIAN:  Allegra Lai. Terrence Dupont, M.D. DATE OF BIRTH:  08-23-1931  DATE OF ADMISSION:  03/14/2013 DATE OF DISCHARGE:  03/24/2013                              DISCHARGE SUMMARY   ADMITTING DIAGNOSES: 1. Community-acquired left lower lobe pneumonia. 2. Status post viral syndrome. 3. Probable gastroenteritis. 4. Hypertension. 5. Mild coronary artery disease in the past. 6. Chronic obstructive pulmonary disease. 7. Chronic recurrent atrial fibrillation. 8. Hypothyroidism. 9. Chronic kidney disease. 10.Peripheral vascular disease. 11.History of congestive heart failure secondary to preserved systolic     function in the past.  DISCHARGE DIAGNOSES: 1. Status post left lower lobe pneumonia. 2. Status post acute pulmonary edema secondary to volume     overload/systolic dysfunction. 3. Status post probable small type 2 non Q-wave myocardial infarction     secondary to demand ischemia. 4. Status post acute respiratory failure secondary to above. 5. Status post left lower lobe pneumonia. 6. Hypertension. 7. Status post chronic recurrent atrial fibrillation with rapid     ventricular response converted back to sinus rhythm. 8. Hypothyroidism. 9. Acute on chronic kidney disease, multifactorial. 10.Peripheral vascular disease. 11.History of congestive heart failure secondary to preserved systolic     function in the past.  DISCHARGE HOME MEDICATIONS: 1. Carvedilol 3.125 mg 1 tablet twice daily. 2. Lasix 40 mg 1 tablet daily. 3. Potassium chloride 20 mEq half tablet daily. 4. Warfarin 2 mg 1 tablet daily. 5. Protonix 40 mg 1 tablet daily. 6. Amiodarone 200 mg 1 tablet daily. 7. Atorvastatin 20 mg 1 tablet daily. 8. Ferrous sulfate 1 tablet daily. 9. Levothyroxine 50 mcg 1 tablet daily.  The patient has been advised to stop diltiazem,  hydrochlorothiazide, Norco, and metoprolol.  DIET:  Low salt, low cholesterol diet.  FOLLOWUP:  Follow up with me in 1 week.  CONDITION AT DISCHARGE:  Stable.  We will check PT/INR and basic metabolic panel in 1 week.  We will repeat the x-ray in 2 weeks as an outpatient.  We will titrate carvedilol as blood pressure tolerates.  We will start ACE inhibitors once renal function is normalized and baseline.  The patient has been advised to stop dialysis due to worsening renal function.  The patient has been advised if she notices black tarry stools or feels weak, tired, should call 911 and go to the ER.  BRIEF HISTORY AND HOSPITAL COURSE:  Brenda [name] is a 80 year old female with past medical history significant for coronary artery disease, hypertension, history of recurrent atrial fibrillation, COPD, hypothyroidism, history of peptic ulcer disease, and GI bleeding in the past many years ago, chronic kidney disease, peripheral vascular disease, history of congestive heart failure secondary to preserved LV systolic function.  She came to the ER complaining of vague abdominal pain associated with diarrhea, cough, and yellowish phlegm for the last 2 weeks associated with generalized malaise.  The patient also complains of chills, states she had flu-like symptoms associated with generalized body aches last week, and has progressively gotten worse.  The patient had CT of the abdomen, which was negative for  acute processes, but was noted to have left lower lobe infiltrate.  The patient was also noted to be hypoxic on room air.  PAST MEDICAL HISTORY:  As above.  PHYSICAL EXAMINATION:  GENERAL:  She was alert, awake, oriented x3. VITAL SIGNS:  Blood pressure was 152/61, pulse was 64, she was afebrile. EYES:  Conjunctivae was congested. NECK:  Supple.  No JVD.  No bruits. LUNGS:  Decreased breath sounds at bases with occasional left lung rhonchi. CARDIOVASCULAR:  Irregularly irregular.   Soft systolic murmur is noted. No S3 gallop. ABDOMEN:  Soft.  Bowel sounds were present.  Mildly distended. Nontender. EXTREMITIES:  There is no clubbing, cyanosis, or edema.  LABORATORY DATA:  Sodium was 144, potassium 3.1, BUN 60, creatinine 2.18, glucose was 115.  Repeat fasting sugar was 69.  Hemoglobin was 14.6, hematocrit 42.9, white count of 15.2 with left shift.  Troponin-I on March 17, 2013, was 0.49, repeat troponin-I was 1.03, repeat 0.58, and next was 0.50, which is trending down.  Blood cultures were negative.  Chest x-ray showed left lower lobe pneumonia post treatment left lower lobe pneumonia.  CT of the abdomen showed air space opacity within the left lung lobes, worrisome for pneumonia, prior cholecystectomy, prior partial gastrectomy, prior bypass grafting of abdominal aortic aneurysm, sigmoid diverticulosis without any acute inflammation.  Chest x-ray done on March 17, 2013, showed worsening left lower lobe pneumonia and development of pulmonary venous hypertension and mild interstitial pulmonary edema.  Repeat x-ray on March 18, 2013, showed resolved pulmonary edema, unchanged left lower lobe pneumonia.  Chest x-ray done today showed left basilar atelectasis and pleural effusion has become less conspicuous since study of March 18, 2013.  The lungs are hyperinflated and consistent with COPD and there is no evidence of CHF.  BRIEF HOSPITAL COURSE:  The patient was admitted to telemetry unit.  Pan cultures were obtained and was started on broad-spectrum antibiotics. The patient subsequently went into AFib during the hospital stay with RVR and went into acute pulmonary edema requiring transfer to CCU.  The patient was diuresed aggressively with improvement in her oxygenation and was subsequently transferred back to daily.  Oral pan cultures were negative.  IV antibiotics were switched to p.o.  Levaquin.  The patient was started on IV amiodarone with  spontaneous conversion to sinus rhythm.  The patient was also started on initially heparin, which was switched to Eliquis with progressive worsening renal function, which has been discontinued and switched to oral Coumadin.  OT/PT consultation was obtained.  The patient has been walking with assistance in room and hallway.  The patient will be discharged home on above medications.  The patient also was noted to have mildly elevated troponin-I, which was felt to be due to type 2 MI secondary to demand ischemia secondary to AFib with RVR.  The patient did not have any episodes of chest pain during the hospital stay.  The patient had 2D echo done, which showed markedly depressed LV systolic function in the range of 30% to 35%. Discussed with the patient and family regarding invasive workup versus medical management for now and repeating the 2D echo as an outpatient. Family agreed for medical management only for now.  We will switch her Lopressor to carvedilol and titrate the dose up as tolerated as an outpatient.  The patient will be discharged home on above medications and will be followed up in my office in 1 week.     Allegra Lai. Terrence Dupont, M.D.  MNH/MEDQ  D:  03/24/2013  T:  03/25/2013  Job:  [id number]

## 2015-06-05 ENCOUNTER — Emergency Department (HOSPITAL_COMMUNITY)
Admission: EM | Admit: 2015-06-05 | Discharge: 2015-06-05 | Disposition: A | Discharge status: Home / Self Care | Payer: Medicare Other | Attending: Emergency Medicine | Admitting: Emergency Medicine

## 2015-06-05 ENCOUNTER — Encounter (HOSPITAL_COMMUNITY): Payer: Self-pay

## 2015-06-05 ENCOUNTER — Emergency Department (HOSPITAL_COMMUNITY): Payer: Medicare Other

## 2015-06-05 DIAGNOSIS — I4891 Unspecified atrial fibrillation: Secondary | ICD-10-CM | POA: Insufficient documentation

## 2015-06-05 DIAGNOSIS — W01198A Fall on same level from slipping, tripping and stumbling with subsequent striking against other object, initial encounter: Secondary | ICD-10-CM | POA: Diagnosis not present

## 2015-06-05 DIAGNOSIS — S2232XA Fracture of one rib, left side, initial encounter for closed fracture: Secondary | ICD-10-CM

## 2015-06-05 DIAGNOSIS — Z8739 Personal history of other diseases of the musculoskeletal system and connective tissue: Secondary | ICD-10-CM | POA: Insufficient documentation

## 2015-06-05 DIAGNOSIS — I10 Essential (primary) hypertension: Secondary | ICD-10-CM | POA: Diagnosis not present

## 2015-06-05 DIAGNOSIS — Z8701 Personal history of pneumonia (recurrent): Secondary | ICD-10-CM | POA: Diagnosis not present

## 2015-06-05 DIAGNOSIS — J449 Chronic obstructive pulmonary disease, unspecified: Secondary | ICD-10-CM | POA: Insufficient documentation

## 2015-06-05 DIAGNOSIS — I2510 Atherosclerotic heart disease of native coronary artery without angina pectoris: Secondary | ICD-10-CM | POA: Insufficient documentation

## 2015-06-05 DIAGNOSIS — S29001A Unspecified injury of muscle and tendon of front wall of thorax, initial encounter: Secondary | ICD-10-CM | POA: Diagnosis present

## 2015-06-05 DIAGNOSIS — E7800 Pure hypercholesterolemia, unspecified: Secondary | ICD-10-CM | POA: Diagnosis not present

## 2015-06-05 DIAGNOSIS — S0012XA Contusion of left eyelid and periocular area, initial encounter: Secondary | ICD-10-CM | POA: Diagnosis not present

## 2015-06-05 DIAGNOSIS — Z87891 Personal history of nicotine dependence: Secondary | ICD-10-CM | POA: Insufficient documentation

## 2015-06-05 DIAGNOSIS — Z8719 Personal history of other diseases of the digestive system: Secondary | ICD-10-CM | POA: Diagnosis not present

## 2015-06-05 DIAGNOSIS — Z7901 Long term (current) use of anticoagulants: Secondary | ICD-10-CM | POA: Diagnosis not present

## 2015-06-05 DIAGNOSIS — Y9289 Other specified places as the place of occurrence of the external cause: Secondary | ICD-10-CM | POA: Diagnosis not present

## 2015-06-05 DIAGNOSIS — Y998 Other external cause status: Secondary | ICD-10-CM | POA: Diagnosis not present

## 2015-06-05 DIAGNOSIS — Y9389 Activity, other specified: Secondary | ICD-10-CM | POA: Insufficient documentation

## 2015-06-05 DIAGNOSIS — I5032 Chronic diastolic (congestive) heart failure: Secondary | ICD-10-CM | POA: Insufficient documentation

## 2015-06-05 DIAGNOSIS — W19XXXA Unspecified fall, initial encounter: Secondary | ICD-10-CM

## 2015-06-05 DIAGNOSIS — Z79899 Other long term (current) drug therapy: Secondary | ICD-10-CM | POA: Diagnosis not present

## 2015-06-05 NOTE — ED Notes (Signed)
PT DISCHARGED. INSTRUCTIONS GIVEN. AAOX3. PT IN NO APPARENT DISTRESS. THE OPPORTUNITY TO ASK QUESTIONS WAS PROVIDED. 

## 2015-06-05 NOTE — ED Provider Notes (Signed)
CSN: [id number]     Arrival date & time 06/05/15  1035 History   First MD Initiated Contact with Patient 06/05/15 1118     Chief Complaint  Patient presents with  . Fall  . Rib Injury    HPI  Elderly F presents five days following a fall. She slipped while reaching to discipline a grandchild, and fell onto her L side.  She suffered mild facial trauma, but denies HA, vision changes, LOC, and has no ongoing head or neck pain. However, She does complain of pain throughout the left lateral rib cage. Pain is worse with coughing. Transient relief with transient relief with Tylenol. No fever, chills, syncope, nausea, vomiting, other focal complaints.   Past Medical History  Diagnosis Date  . HYPERTENSION   . CAROTID ARTERY DISEASE   . COPD     quit smoking 10/11  . GASTRITIS     with GI bleeding  . Edema   . Allergic rhinitis   . PVD (peripheral vascular disease) ([place])     s/p aortobifemoral bypass 12/08/09  . Chronic diastolic heart failure due to hypertrophic obstructive cardiomyopathy ([place])   . Atrial fibrillation ([place])     persistent atrial fibrillation, prior GI bleeding, presently not anticoagulated  . High cholesterol   . Pneumonia 03/14/2013    LLL; "never had it before today" (03/14/2013)  . Exertional shortness of breath   . History of blood transfusion 12/2009    "related to bypass OR to my legs"   . Arthritis     "back" (03/14/2013)   Past Surgical History  Procedure Laterality Date  . Cholecystectomy    . Partial gastrectomy      "stomach ulcer"   . Shoulder open rotator cuff repair Right   . Parotid gland tumor excision    . Aorto-femoral bypass graft Bilateral 12/08/2009    aortobifemoral bypass 12/08/09 [Other]  . Appendectomy    . Cataract extraction w/ intraocular lens  implant, bilateral Bilateral   . Lumbar laminectomy  03/13/2011    Procedure: MICRODISCECTOMY LUMBAR LAMINECTOMY;  Surgeon: [facility];  Location: [facility];  Service: Orthopedics;   Laterality: Bilateral;  Lumbar 3-4, lumbar 4-5, lumbar 5-sacrum 1 decompression.  . Total abdominal hysterectomy      "i've had 2; they did a partial then a complete"  . Cardiac catheterization     Family History  Problem Relation Age of Onset  . Coronary artery disease Mother   . Coronary artery disease Sister   . Stroke Father    Social History  Substance Use Topics  . Smoking status: Former Smoker -- 2.00 packs/day for 57 years    Types: Cigarettes    Quit date: 12/21/2009  . Smokeless tobacco: Never Used  . Alcohol Use: No   OB History    No data available     Review of Systems  Constitutional:       Per HPI, otherwise negative  HENT:       Per HPI, otherwise negative  Respiratory:       Per HPI, otherwise negative  Cardiovascular:       Per HPI, otherwise negative  Gastrointestinal: Negative for vomiting.  Endocrine:       Negative aside from HPI  Genitourinary:       Neg aside from HPI   Musculoskeletal:       Per HPI, otherwise negative  Skin: Positive for color change and wound.  Neurological: Negative for syncope and weakness.  Hematological:  Bruises/bleeds easily.      Allergies  Nsaids and Sulfonamide derivatives  Home Medications   Prior to Admission medications   Medication Sig Start Date End Date Taking? Authorizing Provider  amiodarone (PACERONE) 200 MG tablet Take 200 mg by mouth daily.    Historical Provider, MD  atorvastatin (LIPITOR) 20 MG tablet Take 20 mg by mouth daily.    Historical Provider, MD  carvedilol (COREG) 3.125 MG tablet Take 1 tablet (3.125 mg total) by mouth 2 (two) times daily with a meal. 03/24/13   Charolette Forward, MD  FERROUS SULFATE PO Take 1 tablet by mouth daily.      Historical Provider, MD  furosemide (LASIX) 40 MG tablet Take 1 tablet (40 mg total) by mouth daily. 03/24/13   Charolette Forward, MD  levothyroxine (SYNTHROID, LEVOTHROID) 50 MCG tablet Take 50 mcg by mouth daily before breakfast.    Historical Provider, MD    pantoprazole (PROTONIX) 40 MG tablet Take 1 tablet (40 mg total) by mouth daily. 03/24/13   Charolette Forward, MD  potassium chloride SA (K-DUR,KLOR-CON) 20 MEQ tablet Take 0.5 tablets (10 mEq total) by mouth daily. 03/24/13   Charolette Forward, MD  warfarin (COUMADIN) 2 MG tablet Take 1 tablet (2 mg total) by mouth daily at 6 PM. 03/24/13   Charolette Forward, MD   BP 123/81 mmHg  Pulse 95  Temp(Src) 98.2 F (36.8 C) (Oral)  Resp 16  Ht 5' (1.524 m)  Wt 124 lb (56.246 kg)  BMI 24.22 kg/m2  SpO2 93% Physical Exam  Constitutional: She is oriented to person, place, and time. She appears well-developed and well-nourished. No distress.  HENT:  Head: Normocephalic and atraumatic.    Eyes: Conjunctivae and EOM are normal.  Cardiovascular: Normal rate and regular rhythm.   Pulmonary/Chest: Effort normal and breath sounds normal. No stridor. No respiratory distress.    Abdominal: She exhibits no distension.  Musculoskeletal: She exhibits no edema.  Neurological: She is alert and oriented to person, place, and time. No cranial nerve deficit.  Skin: Skin is warm and dry.  Psychiatric: She has a normal mood and affect.  Nursing note and vitals reviewed.   ED Course  Procedures (including critical care time) Labs Review Labs Reviewed - No data to display  Imaging Review Dg Ribs Unilateral W/chest Left  06/05/2015  CLINICAL DATA:  82 year old female who fell last week with continued left low or inferior rib pain. Initial encounter. EXAM: LEFT RIBS AND CHEST - 3+ VIEW COMPARISON:  03/24/2013 and earlier FINDINGS: Semi upright AP view of the chest. Interval resolved left pleural effusion. Normal lung volumes. Normal cardiac size and mediastinal contours. No pneumothorax or pulmonary edema. No pleural effusion or confluent pulmonary opacity. Calcified aortic atherosclerosis. Chronic epigastric surgical clips. Rib marker placed at the left lateral ninth/ tenth rib level. There is a mildly comminuted minimally  displaced lateral seventh left rib fracture. Possible nondisplaced left eighth rib fracture. No other acute rib fracture identified. Other visualized osseous structures appear intact. IMPRESSION: 1. Minimally displaced left seventh rib fracture. Possible associated nondisplaced left eighth rib fracture. 2.  No acute cardiopulmonary abnormality. Electronically Signed   By: Genevie Ann M.D.   On: 06/05/2015 11:51   I have personally reviewed and evaluated these images and lab results as part of my medical decision-making.    MDM   Final diagnoses:  Fall, initial encounter  Rib fracture, left, closed, initial encounter   Elderly female presents with one week after mechanical fall. Patient  is found to have rib fracture. Here the patient is afebrile, in no distress. Patient was started on scheduled incentive spirometer sessions, had scheduled analgesia, cryotherapy.  With no evidence for pneumonia, and after the passage of almost 1 week, low suspicion for occult other pathology. Patient discharged in stable condition to follow-up with primary care.  Carmin Muskrat, MD 06/05/15 1240

## 2015-06-05 NOTE — ED Notes (Signed)
Patient was getting up from a chair and lost her balance and fell and hit a metal fan  6 days ago. Patient has multiple bruising and c/o left rib cage pain, especially when she coughs or takes a deep breath.

## 2015-06-05 NOTE — Discharge Instructions (Signed)
As discussed, with your rib fracture it is very important that you use the provided incentive spirometer 4 times daily for the next week.  Please use ice packs, 4 times daily, in addition to 650 mg of Tylenol, 3 times daily for pain relief.  Return here for concerning changes in your condition.   Rib Fracture A rib fracture is a break or crack in one of the bones of the ribs. The ribs are a group of long, curved bones that wrap around your chest and attach to your spine. They protect your lungs and other organs in the chest cavity. A broken or cracked rib is often painful, but most do not cause other problems. Most rib fractures heal on their own over time. However, rib fractures can be more serious if multiple ribs are broken or if broken ribs move out of place and push against other structures. CAUSES   A direct blow to the chest. For example, this could happen during contact sports, a car accident, or a fall against a hard object.  Repetitive movements with high force, such as pitching a baseball or having severe coughing spells. SYMPTOMS   Pain when you breathe in or cough.  Pain when someone presses on the injured area. DIAGNOSIS  Your caregiver will perform a physical exam. Various imaging tests may be ordered to confirm the diagnosis and to look for related injuries. These tests may include a chest X-ray, computed tomography (CT), magnetic resonance imaging (MRI), or a bone scan. TREATMENT  Rib fractures usually heal on their own in 1-3 months. The longer healing period is often associated with a continued cough or other aggravating activities. During the healing period, pain control is very important. Medication is usually given to control pain. Hospitalization or surgery may be needed for more severe injuries, such as those in which multiple ribs are broken or the ribs have moved out of place.  HOME CARE INSTRUCTIONS   Avoid strenuous activity and any activities or movements that  cause pain. Be careful during activities and avoid bumping the injured rib.  Gradually increase activity as directed by your caregiver.  Only take over-the-counter or prescription medications as directed by your caregiver. Do not take other medications without asking your caregiver first.  Apply ice to the injured area for the first 1-2 days after you have been treated or as directed by your caregiver. Applying ice helps to reduce inflammation and pain.  Put ice in a plastic bag.  Place a towel between your skin and the bag.   Leave the ice on for 15-20 minutes at a time, every 2 hours while you are awake.  Perform deep breathing as directed by your caregiver. This will help prevent pneumonia, which is a common complication of a broken rib. Your caregiver may instruct you to:  Take deep breaths several times a day.  Try to cough several times a day, holding a pillow against the injured area.  Use a device called an incentive spirometer to practice deep breathing several times a day.  Drink enough fluids to keep your urine clear or pale yellow. This will help you avoid constipation.   Do not wear a rib belt or binder. These restrict breathing, which can lead to pneumonia.  SEEK IMMEDIATE MEDICAL CARE IF:   You have a fever.   You have difficulty breathing or shortness of breath.   You develop a continual cough, or you cough up thick or bloody sputum.  You feel sick to  your stomach (nausea), throw up (vomit), or have abdominal pain.   You have worsening pain not controlled with medications.  MAKE SURE YOU:  Understand these instructions.  Will watch your condition.  Will get help right away if you are not doing well or get worse.   This information is not intended to replace advice given to you by your health care provider. Make sure you discuss any questions you have with your health care provider.   Document Released: 02/17/2005 Document Revised: 10/20/2012  Document Reviewed: 04/21/2012 Elsevier Interactive Patient Education [employer].

## 2016-09-15 DIAGNOSIS — N189 Chronic kidney disease, unspecified: Secondary | ICD-10-CM | POA: Diagnosis not present

## 2016-09-15 DIAGNOSIS — I482 Chronic atrial fibrillation: Secondary | ICD-10-CM | POA: Diagnosis not present

## 2016-09-15 DIAGNOSIS — I1310 Hypertensive heart and chronic kidney disease without heart failure, with stage 1 through stage 4 chronic kidney disease, or unspecified chronic kidney disease: Secondary | ICD-10-CM | POA: Diagnosis not present

## 2016-09-15 DIAGNOSIS — Z0000 Encounter for general adult medical examination without abnormal findings: Secondary | ICD-10-CM | POA: Diagnosis not present

## 2016-12-10 DIAGNOSIS — E785 Hyperlipidemia, unspecified: Secondary | ICD-10-CM | POA: Diagnosis not present

## 2016-12-10 DIAGNOSIS — M109 Gout, unspecified: Secondary | ICD-10-CM | POA: Diagnosis not present

## 2016-12-10 DIAGNOSIS — I10 Essential (primary) hypertension: Secondary | ICD-10-CM | POA: Diagnosis not present

## 2016-12-15 DIAGNOSIS — I509 Heart failure, unspecified: Secondary | ICD-10-CM | POA: Diagnosis not present

## 2016-12-15 DIAGNOSIS — I482 Chronic atrial fibrillation: Secondary | ICD-10-CM | POA: Diagnosis not present

## 2016-12-15 DIAGNOSIS — I1310 Hypertensive heart and chronic kidney disease without heart failure, with stage 1 through stage 4 chronic kidney disease, or unspecified chronic kidney disease: Secondary | ICD-10-CM | POA: Diagnosis not present

## 2016-12-15 DIAGNOSIS — N189 Chronic kidney disease, unspecified: Secondary | ICD-10-CM | POA: Diagnosis not present

## 2017-03-16 DIAGNOSIS — I1310 Hypertensive heart and chronic kidney disease without heart failure, with stage 1 through stage 4 chronic kidney disease, or unspecified chronic kidney disease: Secondary | ICD-10-CM | POA: Diagnosis not present

## 2017-03-16 DIAGNOSIS — N189 Chronic kidney disease, unspecified: Secondary | ICD-10-CM | POA: Diagnosis not present

## 2017-03-16 DIAGNOSIS — I509 Heart failure, unspecified: Secondary | ICD-10-CM | POA: Diagnosis not present

## 2017-03-16 DIAGNOSIS — I482 Chronic atrial fibrillation: Secondary | ICD-10-CM | POA: Diagnosis not present

## 2017-06-15 DIAGNOSIS — I482 Chronic atrial fibrillation: Secondary | ICD-10-CM | POA: Diagnosis not present

## 2017-06-15 DIAGNOSIS — N189 Chronic kidney disease, unspecified: Secondary | ICD-10-CM | POA: Diagnosis not present

## 2017-06-15 DIAGNOSIS — I10 Essential (primary) hypertension: Secondary | ICD-10-CM | POA: Diagnosis not present

## 2017-06-15 DIAGNOSIS — I509 Heart failure, unspecified: Secondary | ICD-10-CM | POA: Diagnosis not present

## 2017-09-14 DIAGNOSIS — I509 Heart failure, unspecified: Secondary | ICD-10-CM | POA: Diagnosis not present

## 2017-09-14 DIAGNOSIS — I10 Essential (primary) hypertension: Secondary | ICD-10-CM | POA: Diagnosis not present

## 2017-09-14 DIAGNOSIS — I482 Chronic atrial fibrillation: Secondary | ICD-10-CM | POA: Diagnosis not present

## 2017-09-14 DIAGNOSIS — I739 Peripheral vascular disease, unspecified: Secondary | ICD-10-CM | POA: Diagnosis not present

## 2017-10-19 DIAGNOSIS — N189 Chronic kidney disease, unspecified: Secondary | ICD-10-CM | POA: Diagnosis not present

## 2017-10-19 DIAGNOSIS — E039 Hypothyroidism, unspecified: Secondary | ICD-10-CM | POA: Diagnosis not present

## 2017-10-19 DIAGNOSIS — I110 Hypertensive heart disease with heart failure: Secondary | ICD-10-CM | POA: Diagnosis not present

## 2017-10-19 DIAGNOSIS — E785 Hyperlipidemia, unspecified: Secondary | ICD-10-CM | POA: Diagnosis not present

## 2017-12-14 DIAGNOSIS — N189 Chronic kidney disease, unspecified: Secondary | ICD-10-CM | POA: Diagnosis not present

## 2017-12-14 DIAGNOSIS — I482 Chronic atrial fibrillation, unspecified: Secondary | ICD-10-CM | POA: Diagnosis not present

## 2017-12-14 DIAGNOSIS — I509 Heart failure, unspecified: Secondary | ICD-10-CM | POA: Diagnosis not present

## 2017-12-14 DIAGNOSIS — I10 Essential (primary) hypertension: Secondary | ICD-10-CM | POA: Diagnosis not present

## 2018-03-15 DIAGNOSIS — I10 Essential (primary) hypertension: Secondary | ICD-10-CM | POA: Diagnosis not present

## 2018-03-15 DIAGNOSIS — G4700 Insomnia, unspecified: Secondary | ICD-10-CM | POA: Diagnosis not present

## 2018-03-15 DIAGNOSIS — I509 Heart failure, unspecified: Secondary | ICD-10-CM | POA: Diagnosis not present

## 2018-03-15 DIAGNOSIS — I482 Chronic atrial fibrillation, unspecified: Secondary | ICD-10-CM | POA: Diagnosis not present

## 2018-05-05 DIAGNOSIS — E785 Hyperlipidemia, unspecified: Secondary | ICD-10-CM | POA: Diagnosis not present

## 2018-05-05 DIAGNOSIS — I10 Essential (primary) hypertension: Secondary | ICD-10-CM | POA: Diagnosis not present

## 2018-05-14 DIAGNOSIS — I482 Chronic atrial fibrillation, unspecified: Secondary | ICD-10-CM | POA: Diagnosis not present

## 2018-05-14 DIAGNOSIS — I509 Heart failure, unspecified: Secondary | ICD-10-CM | POA: Diagnosis not present

## 2018-05-14 DIAGNOSIS — I10 Essential (primary) hypertension: Secondary | ICD-10-CM | POA: Diagnosis not present

## 2018-05-14 DIAGNOSIS — J029 Acute pharyngitis, unspecified: Secondary | ICD-10-CM | POA: Diagnosis not present

## 2018-07-08 DIAGNOSIS — E039 Hypothyroidism, unspecified: Secondary | ICD-10-CM | POA: Diagnosis not present

## 2018-07-08 DIAGNOSIS — I2510 Atherosclerotic heart disease of native coronary artery without angina pectoris: Secondary | ICD-10-CM | POA: Diagnosis not present

## 2018-07-08 DIAGNOSIS — Z0000 Encounter for general adult medical examination without abnormal findings: Secondary | ICD-10-CM | POA: Diagnosis not present

## 2018-07-08 DIAGNOSIS — I482 Chronic atrial fibrillation, unspecified: Secondary | ICD-10-CM | POA: Diagnosis not present

## 2018-08-10 DIAGNOSIS — N184 Chronic kidney disease, stage 4 (severe): Secondary | ICD-10-CM | POA: Diagnosis not present

## 2018-08-10 DIAGNOSIS — I10 Essential (primary) hypertension: Secondary | ICD-10-CM | POA: Diagnosis not present

## 2018-08-10 DIAGNOSIS — E538 Deficiency of other specified B group vitamins: Secondary | ICD-10-CM | POA: Diagnosis not present

## 2018-08-10 DIAGNOSIS — I2510 Atherosclerotic heart disease of native coronary artery without angina pectoris: Secondary | ICD-10-CM | POA: Diagnosis not present

## 2018-08-16 DIAGNOSIS — I10 Essential (primary) hypertension: Secondary | ICD-10-CM | POA: Diagnosis not present

## 2018-08-16 DIAGNOSIS — I482 Chronic atrial fibrillation, unspecified: Secondary | ICD-10-CM | POA: Diagnosis not present

## 2018-08-16 DIAGNOSIS — I739 Peripheral vascular disease, unspecified: Secondary | ICD-10-CM | POA: Diagnosis not present

## 2018-08-16 DIAGNOSIS — I509 Heart failure, unspecified: Secondary | ICD-10-CM | POA: Diagnosis not present

## 2018-08-17 DIAGNOSIS — N183 Chronic kidney disease, stage 3 (moderate): Secondary | ICD-10-CM | POA: Diagnosis not present

## 2018-08-17 DIAGNOSIS — D649 Anemia, unspecified: Secondary | ICD-10-CM | POA: Diagnosis not present

## 2018-08-17 DIAGNOSIS — I129 Hypertensive chronic kidney disease with stage 1 through stage 4 chronic kidney disease, or unspecified chronic kidney disease: Secondary | ICD-10-CM | POA: Diagnosis not present

## 2018-08-17 DIAGNOSIS — N184 Chronic kidney disease, stage 4 (severe): Secondary | ICD-10-CM | POA: Diagnosis not present

## 2018-08-17 DIAGNOSIS — N390 Urinary tract infection, site not specified: Secondary | ICD-10-CM | POA: Diagnosis not present

## 2018-08-17 DIAGNOSIS — I2510 Atherosclerotic heart disease of native coronary artery without angina pectoris: Secondary | ICD-10-CM | POA: Diagnosis not present

## 2018-08-24 DIAGNOSIS — E538 Deficiency of other specified B group vitamins: Secondary | ICD-10-CM | POA: Diagnosis not present

## 2018-08-25 ENCOUNTER — Other Ambulatory Visit: Payer: Self-pay | Admitting: Nephrology

## 2018-08-25 DIAGNOSIS — N184 Chronic kidney disease, stage 4 (severe): Secondary | ICD-10-CM

## 2018-08-31 ENCOUNTER — Other Ambulatory Visit: Payer: Self-pay

## 2018-08-31 ENCOUNTER — Ambulatory Visit
Admission: RE | Admit: 2018-08-31 | Discharge: 2018-08-31 | Disposition: A | Discharge status: Home / Self Care | Payer: Medicare Other | Source: Ambulatory Visit | Attending: Nephrology | Admitting: Nephrology

## 2018-08-31 DIAGNOSIS — N281 Cyst of kidney, acquired: Secondary | ICD-10-CM | POA: Diagnosis not present

## 2018-08-31 DIAGNOSIS — N184 Chronic kidney disease, stage 4 (severe): Secondary | ICD-10-CM

## 2018-09-13 DIAGNOSIS — R079 Chest pain, unspecified: Secondary | ICD-10-CM | POA: Diagnosis not present

## 2018-09-20 DIAGNOSIS — N184 Chronic kidney disease, stage 4 (severe): Secondary | ICD-10-CM | POA: Diagnosis not present

## 2018-09-20 DIAGNOSIS — D649 Anemia, unspecified: Secondary | ICD-10-CM | POA: Diagnosis not present

## 2018-09-20 DIAGNOSIS — I129 Hypertensive chronic kidney disease with stage 1 through stage 4 chronic kidney disease, or unspecified chronic kidney disease: Secondary | ICD-10-CM | POA: Diagnosis not present

## 2018-09-20 DIAGNOSIS — I2510 Atherosclerotic heart disease of native coronary artery without angina pectoris: Secondary | ICD-10-CM | POA: Diagnosis not present

## 2018-10-18 DIAGNOSIS — E559 Vitamin D deficiency, unspecified: Secondary | ICD-10-CM | POA: Diagnosis not present

## 2018-10-20 DIAGNOSIS — N184 Chronic kidney disease, stage 4 (severe): Secondary | ICD-10-CM | POA: Diagnosis not present

## 2018-10-25 DIAGNOSIS — N2581 Secondary hyperparathyroidism of renal origin: Secondary | ICD-10-CM | POA: Diagnosis not present

## 2018-10-25 DIAGNOSIS — N184 Chronic kidney disease, stage 4 (severe): Secondary | ICD-10-CM | POA: Diagnosis not present

## 2018-10-25 DIAGNOSIS — D649 Anemia, unspecified: Secondary | ICD-10-CM | POA: Diagnosis not present

## 2018-10-25 DIAGNOSIS — I129 Hypertensive chronic kidney disease with stage 1 through stage 4 chronic kidney disease, or unspecified chronic kidney disease: Secondary | ICD-10-CM | POA: Diagnosis not present

## 2018-11-15 DIAGNOSIS — I739 Peripheral vascular disease, unspecified: Secondary | ICD-10-CM | POA: Diagnosis not present

## 2018-11-15 DIAGNOSIS — I10 Essential (primary) hypertension: Secondary | ICD-10-CM | POA: Diagnosis not present

## 2018-11-15 DIAGNOSIS — I509 Heart failure, unspecified: Secondary | ICD-10-CM | POA: Diagnosis not present

## 2018-11-15 DIAGNOSIS — I482 Chronic atrial fibrillation, unspecified: Secondary | ICD-10-CM | POA: Diagnosis not present

## 2019-01-05 DIAGNOSIS — N184 Chronic kidney disease, stage 4 (severe): Secondary | ICD-10-CM | POA: Diagnosis not present

## 2019-01-11 DIAGNOSIS — D649 Anemia, unspecified: Secondary | ICD-10-CM | POA: Diagnosis not present

## 2019-01-11 DIAGNOSIS — I2510 Atherosclerotic heart disease of native coronary artery without angina pectoris: Secondary | ICD-10-CM | POA: Diagnosis not present

## 2019-01-11 DIAGNOSIS — E039 Hypothyroidism, unspecified: Secondary | ICD-10-CM | POA: Diagnosis not present

## 2019-01-11 DIAGNOSIS — N184 Chronic kidney disease, stage 4 (severe): Secondary | ICD-10-CM | POA: Diagnosis not present

## 2019-01-11 DIAGNOSIS — I129 Hypertensive chronic kidney disease with stage 1 through stage 4 chronic kidney disease, or unspecified chronic kidney disease: Secondary | ICD-10-CM | POA: Diagnosis not present

## 2019-01-11 DIAGNOSIS — N2581 Secondary hyperparathyroidism of renal origin: Secondary | ICD-10-CM | POA: Diagnosis not present

## 2019-01-11 DIAGNOSIS — E559 Vitamin D deficiency, unspecified: Secondary | ICD-10-CM | POA: Diagnosis not present

## 2019-02-09 DIAGNOSIS — E039 Hypothyroidism, unspecified: Secondary | ICD-10-CM | POA: Diagnosis not present

## 2019-02-09 DIAGNOSIS — E538 Deficiency of other specified B group vitamins: Secondary | ICD-10-CM | POA: Diagnosis not present

## 2019-02-14 DIAGNOSIS — N189 Chronic kidney disease, unspecified: Secondary | ICD-10-CM | POA: Diagnosis not present

## 2019-02-14 DIAGNOSIS — I482 Chronic atrial fibrillation, unspecified: Secondary | ICD-10-CM | POA: Diagnosis not present

## 2019-02-14 DIAGNOSIS — I10 Essential (primary) hypertension: Secondary | ICD-10-CM | POA: Diagnosis not present

## 2019-02-14 DIAGNOSIS — I509 Heart failure, unspecified: Secondary | ICD-10-CM | POA: Diagnosis not present

## 2019-03-28 DIAGNOSIS — N184 Chronic kidney disease, stage 4 (severe): Secondary | ICD-10-CM | POA: Diagnosis not present

## 2019-03-29 DIAGNOSIS — N184 Chronic kidney disease, stage 4 (severe): Secondary | ICD-10-CM | POA: Diagnosis not present

## 2019-04-06 DIAGNOSIS — N184 Chronic kidney disease, stage 4 (severe): Secondary | ICD-10-CM | POA: Diagnosis not present

## 2019-04-06 DIAGNOSIS — I129 Hypertensive chronic kidney disease with stage 1 through stage 4 chronic kidney disease, or unspecified chronic kidney disease: Secondary | ICD-10-CM | POA: Diagnosis not present

## 2019-04-06 DIAGNOSIS — D649 Anemia, unspecified: Secondary | ICD-10-CM | POA: Diagnosis not present

## 2019-04-06 DIAGNOSIS — N2581 Secondary hyperparathyroidism of renal origin: Secondary | ICD-10-CM | POA: Diagnosis not present

## 2019-05-16 DIAGNOSIS — I482 Chronic atrial fibrillation, unspecified: Secondary | ICD-10-CM | POA: Diagnosis not present

## 2019-05-16 DIAGNOSIS — I10 Essential (primary) hypertension: Secondary | ICD-10-CM | POA: Diagnosis not present

## 2019-05-16 DIAGNOSIS — I509 Heart failure, unspecified: Secondary | ICD-10-CM | POA: Diagnosis not present

## 2019-05-16 DIAGNOSIS — E785 Hyperlipidemia, unspecified: Secondary | ICD-10-CM | POA: Diagnosis not present

## 2019-05-30 DIAGNOSIS — N184 Chronic kidney disease, stage 4 (severe): Secondary | ICD-10-CM | POA: Diagnosis not present

## 2019-06-06 DIAGNOSIS — D649 Anemia, unspecified: Secondary | ICD-10-CM | POA: Diagnosis not present

## 2019-06-06 DIAGNOSIS — N184 Chronic kidney disease, stage 4 (severe): Secondary | ICD-10-CM | POA: Diagnosis not present

## 2019-06-06 DIAGNOSIS — I129 Hypertensive chronic kidney disease with stage 1 through stage 4 chronic kidney disease, or unspecified chronic kidney disease: Secondary | ICD-10-CM | POA: Diagnosis not present

## 2019-06-06 DIAGNOSIS — N2581 Secondary hyperparathyroidism of renal origin: Secondary | ICD-10-CM | POA: Diagnosis not present

## 2019-06-24 DIAGNOSIS — I10 Essential (primary) hypertension: Secondary | ICD-10-CM | POA: Diagnosis not present

## 2019-06-24 DIAGNOSIS — E039 Hypothyroidism, unspecified: Secondary | ICD-10-CM | POA: Diagnosis not present

## 2019-06-24 DIAGNOSIS — I2510 Atherosclerotic heart disease of native coronary artery without angina pectoris: Secondary | ICD-10-CM | POA: Diagnosis not present

## 2019-06-24 DIAGNOSIS — N184 Chronic kidney disease, stage 4 (severe): Secondary | ICD-10-CM | POA: Diagnosis not present

## 2019-07-12 DIAGNOSIS — Z0000 Encounter for general adult medical examination without abnormal findings: Secondary | ICD-10-CM | POA: Diagnosis not present

## 2019-07-12 DIAGNOSIS — E559 Vitamin D deficiency, unspecified: Secondary | ICD-10-CM | POA: Diagnosis not present

## 2019-07-12 DIAGNOSIS — E039 Hypothyroidism, unspecified: Secondary | ICD-10-CM | POA: Diagnosis not present

## 2019-07-12 DIAGNOSIS — I2510 Atherosclerotic heart disease of native coronary artery without angina pectoris: Secondary | ICD-10-CM | POA: Diagnosis not present

## 2019-08-02 DIAGNOSIS — I129 Hypertensive chronic kidney disease with stage 1 through stage 4 chronic kidney disease, or unspecified chronic kidney disease: Secondary | ICD-10-CM | POA: Diagnosis not present

## 2019-08-02 DIAGNOSIS — D649 Anemia, unspecified: Secondary | ICD-10-CM | POA: Diagnosis not present

## 2019-08-02 DIAGNOSIS — N184 Chronic kidney disease, stage 4 (severe): Secondary | ICD-10-CM | POA: Diagnosis not present

## 2019-08-02 DIAGNOSIS — N2581 Secondary hyperparathyroidism of renal origin: Secondary | ICD-10-CM | POA: Diagnosis not present

## 2019-08-09 DIAGNOSIS — I2510 Atherosclerotic heart disease of native coronary artery without angina pectoris: Secondary | ICD-10-CM | POA: Diagnosis not present

## 2019-08-09 DIAGNOSIS — I10 Essential (primary) hypertension: Secondary | ICD-10-CM | POA: Diagnosis not present

## 2019-08-09 DIAGNOSIS — E039 Hypothyroidism, unspecified: Secondary | ICD-10-CM | POA: Diagnosis not present

## 2019-08-09 DIAGNOSIS — N184 Chronic kidney disease, stage 4 (severe): Secondary | ICD-10-CM | POA: Diagnosis not present

## 2019-08-15 DIAGNOSIS — I482 Chronic atrial fibrillation, unspecified: Secondary | ICD-10-CM | POA: Diagnosis not present

## 2019-08-15 DIAGNOSIS — I739 Peripheral vascular disease, unspecified: Secondary | ICD-10-CM | POA: Diagnosis not present

## 2019-08-15 DIAGNOSIS — I10 Essential (primary) hypertension: Secondary | ICD-10-CM | POA: Diagnosis not present

## 2019-08-15 DIAGNOSIS — I509 Heart failure, unspecified: Secondary | ICD-10-CM | POA: Diagnosis not present

## 2019-09-26 DIAGNOSIS — N184 Chronic kidney disease, stage 4 (severe): Secondary | ICD-10-CM | POA: Diagnosis not present

## 2019-09-27 DIAGNOSIS — N184 Chronic kidney disease, stage 4 (severe): Secondary | ICD-10-CM | POA: Diagnosis not present

## 2019-09-30 DIAGNOSIS — N186 End stage renal disease: Secondary | ICD-10-CM | POA: Diagnosis not present

## 2019-09-30 DIAGNOSIS — G2581 Restless legs syndrome: Secondary | ICD-10-CM | POA: Diagnosis not present

## 2019-09-30 DIAGNOSIS — Z742 Need for assistance at home and no other household member able to render care: Secondary | ICD-10-CM | POA: Diagnosis not present

## 2019-09-30 DIAGNOSIS — R2681 Unsteadiness on feet: Secondary | ICD-10-CM | POA: Diagnosis not present

## 2019-10-02 DIAGNOSIS — I120 Hypertensive chronic kidney disease with stage 5 chronic kidney disease or end stage renal disease: Secondary | ICD-10-CM | POA: Diagnosis not present

## 2019-10-02 DIAGNOSIS — I252 Old myocardial infarction: Secondary | ICD-10-CM | POA: Diagnosis not present

## 2019-10-02 DIAGNOSIS — I2510 Atherosclerotic heart disease of native coronary artery without angina pectoris: Secondary | ICD-10-CM | POA: Diagnosis not present

## 2019-10-02 DIAGNOSIS — N186 End stage renal disease: Secondary | ICD-10-CM | POA: Diagnosis not present

## 2019-10-09 ENCOUNTER — Emergency Department (HOSPITAL_COMMUNITY): Payer: Medicare Other

## 2019-10-09 ENCOUNTER — Other Ambulatory Visit: Payer: Self-pay

## 2019-10-09 ENCOUNTER — Inpatient Hospital Stay (HOSPITAL_COMMUNITY)
Admission: EM | Admit: 2019-10-09 | Discharge: 2019-11-02 | DRG: 177 | Disposition: E | Payer: Medicare Other | Attending: Internal Medicine | Admitting: Internal Medicine

## 2019-10-09 DIAGNOSIS — J440 Chronic obstructive pulmonary disease with acute lower respiratory infection: Secondary | ICD-10-CM | POA: Diagnosis present

## 2019-10-09 DIAGNOSIS — Z886 Allergy status to analgesic agent status: Secondary | ICD-10-CM

## 2019-10-09 DIAGNOSIS — N184 Chronic kidney disease, stage 4 (severe): Secondary | ICD-10-CM | POA: Diagnosis present

## 2019-10-09 DIAGNOSIS — Z9841 Cataract extraction status, right eye: Secondary | ICD-10-CM

## 2019-10-09 DIAGNOSIS — Z515 Encounter for palliative care: Secondary | ICD-10-CM | POA: Diagnosis not present

## 2019-10-09 DIAGNOSIS — Z87891 Personal history of nicotine dependence: Secondary | ICD-10-CM

## 2019-10-09 DIAGNOSIS — U071 COVID-19: Principal | ICD-10-CM | POA: Diagnosis present

## 2019-10-09 DIAGNOSIS — J9601 Acute respiratory failure with hypoxia: Secondary | ICD-10-CM | POA: Diagnosis present

## 2019-10-09 DIAGNOSIS — Z209 Contact with and (suspected) exposure to unspecified communicable disease: Secondary | ICD-10-CM | POA: Diagnosis not present

## 2019-10-09 DIAGNOSIS — Z283 Underimmunization status: Secondary | ICD-10-CM

## 2019-10-09 DIAGNOSIS — R636 Underweight: Secondary | ICD-10-CM | POA: Diagnosis present

## 2019-10-09 DIAGNOSIS — J1282 Pneumonia due to coronavirus disease 2019: Secondary | ICD-10-CM | POA: Diagnosis present

## 2019-10-09 DIAGNOSIS — Z961 Presence of intraocular lens: Secondary | ICD-10-CM | POA: Diagnosis present

## 2019-10-09 DIAGNOSIS — Z882 Allergy status to sulfonamides status: Secondary | ICD-10-CM

## 2019-10-09 DIAGNOSIS — J449 Chronic obstructive pulmonary disease, unspecified: Secondary | ICD-10-CM | POA: Diagnosis not present

## 2019-10-09 DIAGNOSIS — Z9842 Cataract extraction status, left eye: Secondary | ICD-10-CM

## 2019-10-09 DIAGNOSIS — R5381 Other malaise: Secondary | ICD-10-CM | POA: Diagnosis not present

## 2019-10-09 DIAGNOSIS — R0602 Shortness of breath: Secondary | ICD-10-CM | POA: Diagnosis not present

## 2019-10-09 DIAGNOSIS — Z823 Family history of stroke: Secondary | ICD-10-CM

## 2019-10-09 DIAGNOSIS — R0902 Hypoxemia: Secondary | ICD-10-CM | POA: Diagnosis not present

## 2019-10-09 DIAGNOSIS — R404 Transient alteration of awareness: Secondary | ICD-10-CM | POA: Diagnosis not present

## 2019-10-09 DIAGNOSIS — I130 Hypertensive heart and chronic kidney disease with heart failure and stage 1 through stage 4 chronic kidney disease, or unspecified chronic kidney disease: Secondary | ICD-10-CM | POA: Diagnosis present

## 2019-10-09 DIAGNOSIS — I5032 Chronic diastolic (congestive) heart failure: Secondary | ICD-10-CM | POA: Diagnosis present

## 2019-10-09 DIAGNOSIS — Z66 Do not resuscitate: Secondary | ICD-10-CM | POA: Diagnosis not present

## 2019-10-09 DIAGNOSIS — Z79899 Other long term (current) drug therapy: Secondary | ICD-10-CM

## 2019-10-09 DIAGNOSIS — E785 Hyperlipidemia, unspecified: Secondary | ICD-10-CM | POA: Diagnosis present

## 2019-10-09 DIAGNOSIS — Z7901 Long term (current) use of anticoagulants: Secondary | ICD-10-CM

## 2019-10-09 DIAGNOSIS — R54 Age-related physical debility: Secondary | ICD-10-CM | POA: Diagnosis present

## 2019-10-09 DIAGNOSIS — Z8249 Family history of ischemic heart disease and other diseases of the circulatory system: Secondary | ICD-10-CM

## 2019-10-09 DIAGNOSIS — Z7189 Other specified counseling: Secondary | ICD-10-CM

## 2019-10-09 DIAGNOSIS — Z79890 Hormone replacement therapy (postmenopausal): Secondary | ICD-10-CM

## 2019-10-09 DIAGNOSIS — E876 Hypokalemia: Secondary | ICD-10-CM | POA: Diagnosis not present

## 2019-10-09 DIAGNOSIS — G92 Toxic encephalopathy: Secondary | ICD-10-CM | POA: Diagnosis not present

## 2019-10-09 DIAGNOSIS — I4819 Other persistent atrial fibrillation: Secondary | ICD-10-CM | POA: Diagnosis present

## 2019-10-09 DIAGNOSIS — I517 Cardiomegaly: Secondary | ICD-10-CM | POA: Diagnosis not present

## 2019-10-09 DIAGNOSIS — I421 Obstructive hypertrophic cardiomyopathy: Secondary | ICD-10-CM | POA: Diagnosis present

## 2019-10-09 DIAGNOSIS — I10 Essential (primary) hypertension: Secondary | ICD-10-CM | POA: Diagnosis present

## 2019-10-09 DIAGNOSIS — I4891 Unspecified atrial fibrillation: Secondary | ICD-10-CM | POA: Diagnosis present

## 2019-10-09 DIAGNOSIS — E870 Hyperosmolality and hypernatremia: Secondary | ICD-10-CM | POA: Diagnosis not present

## 2019-10-09 DIAGNOSIS — J9602 Acute respiratory failure with hypercapnia: Secondary | ICD-10-CM | POA: Diagnosis present

## 2019-10-09 DIAGNOSIS — Z681 Body mass index (BMI) 19 or less, adult: Secondary | ICD-10-CM

## 2019-10-09 DIAGNOSIS — E860 Dehydration: Secondary | ICD-10-CM | POA: Diagnosis not present

## 2019-10-09 LAB — COMPREHENSIVE METABOLIC PANEL
ALT: 69 U/L — ABNORMAL HIGH (ref 0–44)
AST: 125 U/L — ABNORMAL HIGH (ref 15–41)
Albumin: 2.8 g/dL — ABNORMAL LOW (ref 3.5–5.0)
Alkaline Phosphatase: 82 U/L (ref 38–126)
Anion gap: 13 (ref 5–15)
BUN: 64 mg/dL — ABNORMAL HIGH (ref 8–23)
CO2: 29 mmol/L (ref 22–32)
Calcium: 8.8 mg/dL — ABNORMAL LOW (ref 8.9–10.3)
Chloride: 99 mmol/L (ref 98–111)
Creatinine, Ser: 3.63 mg/dL — ABNORMAL HIGH (ref 0.44–1.00)
GFR calc Af Amer: 12 mL/min — ABNORMAL LOW (ref >60)
GFR calc non Af Amer: 11 mL/min — ABNORMAL LOW (ref >60)
Glucose, Bld: 109 mg/dL — ABNORMAL HIGH (ref 70–99)
Potassium: 3.5 mmol/L (ref 3.5–5.1)
Sodium: 141 mmol/L (ref 135–145)
Total Bilirubin: 0.6 mg/dL (ref 0.3–1.2)
Total Protein: 6.7 g/dL (ref 6.5–8.1)

## 2019-10-09 LAB — CBC WITH DIFFERENTIAL/PLATELET
Abs Immature Granulocytes: 0.11 10*3/uL — ABNORMAL HIGH (ref 0.00–0.07)
Basophils Absolute: 0 10*3/uL (ref 0.0–0.1)
Basophils Relative: 0 %
Eosinophils Absolute: 0 10*3/uL (ref 0.0–0.5)
Eosinophils Relative: 0 %
HCT: 37.5 % (ref 36.0–46.0)
Hemoglobin: 11.4 g/dL — ABNORMAL LOW (ref 12.0–15.0)
Immature Granulocytes: 1 %
Lymphocytes Relative: 5 %
Lymphs Abs: 0.7 10*3/uL (ref 0.7–4.0)
MCH: 29.6 pg (ref 26.0–34.0)
MCHC: 30.4 g/dL (ref 30.0–36.0)
MCV: 97.4 fL (ref 80.0–100.0)
Monocytes Absolute: 0.5 10*3/uL (ref 0.1–1.0)
Monocytes Relative: 4 %
Neutro Abs: 13.6 10*3/uL — ABNORMAL HIGH (ref 1.7–7.7)
Neutrophils Relative %: 90 %
Platelets: 240 10*3/uL (ref 150–400)
RBC: 3.85 MIL/uL — ABNORMAL LOW (ref 3.87–5.11)
RDW: 14.5 % (ref 11.5–15.5)
WBC: 14.9 10*3/uL — ABNORMAL HIGH (ref 4.0–10.5)
nRBC: 0 % (ref 0.0–0.2)

## 2019-10-09 LAB — LACTATE DEHYDROGENASE: LDH: 237 U/L — ABNORMAL HIGH (ref 98–192)

## 2019-10-09 LAB — TRIGLYCERIDES: Triglycerides: 175 mg/dL — ABNORMAL HIGH (ref <150)

## 2019-10-09 LAB — C-REACTIVE PROTEIN: CRP: 16.8 mg/dL — ABNORMAL HIGH (ref <1.0)

## 2019-10-09 LAB — LACTIC ACID, PLASMA
Lactic Acid, Venous: 1.4 mmol/L (ref 0.5–1.9)
Lactic Acid, Venous: 1 mmol/L (ref 0.5–1.9)

## 2019-10-09 LAB — D-DIMER, QUANTITATIVE: D-Dimer, Quant: 1.91 ug/mL-FEU — ABNORMAL HIGH (ref 0.00–0.50)

## 2019-10-09 LAB — SARS CORONAVIRUS 2 BY RT PCR (HOSPITAL ORDER, PERFORMED IN ~~LOC~~ HOSPITAL LAB): SARS Coronavirus 2: POSITIVE — AB

## 2019-10-09 LAB — FIBRINOGEN: Fibrinogen: 581 mg/dL — ABNORMAL HIGH (ref 210–475)

## 2019-10-09 LAB — TROPONIN I (HIGH SENSITIVITY)
Troponin I (High Sensitivity): 29 ng/L — ABNORMAL HIGH (ref <18)
Troponin I (High Sensitivity): 34 ng/L — ABNORMAL HIGH (ref <18)

## 2019-10-09 LAB — PROCALCITONIN: Procalcitonin: 0.31 ng/mL

## 2019-10-09 LAB — FERRITIN: Ferritin: 246 ng/mL (ref 11–307)

## 2019-10-09 MED ORDER — ALBUTEROL SULFATE HFA 108 (90 BASE) MCG/ACT IN AERS
2.0000 | INHALATION_SPRAY | RESPIRATORY_TRACT | Status: DC | PRN
  Administered 2019-10-09: 2 via RESPIRATORY_TRACT
  Filled 2019-10-09: qty 6.7

## 2019-10-09 MED ORDER — DEXAMETHASONE SODIUM PHOSPHATE 10 MG/ML IJ SOLN
6.0000 mg | Freq: Once | INTRAMUSCULAR | Status: AC
  Administered 2019-10-09: 6 mg via INTRAVENOUS
  Filled 2019-10-09: qty 1

## 2019-10-09 NOTE — ED Triage Notes (Signed)
BIB by [facility] from home. EMS reports pt's granddaughter tested positive for COVID last week. Pt has wet cough, and "crackles" per EMS. V/s stable. EMS reports 02 sat of 88 % on room air.

## 2019-10-09 NOTE — ED Provider Notes (Signed)
[facility] Emergency Department Provider Note MRN:  [id number]  Arrival date & time: 10/31/2019     Chief Complaint   Shortness of History of Present Illness   Brenda [name] is a 86 y.o. year-old female with a history of CAD, COPD presenting to the ED with chief complaint of shortness of breath.  Cough, fever, shortness of breath worsening over the past week.  Granddaughter tested positive for Covid.  Patient is not vaccinated.  Denies chest pain, no abdominal pain, no rash, no other complaints.  Feeling much better with oxygen.  Review of Systems  A complete 10 system review of systems was obtained and all systems are negative except as noted in the HPI and PMH.   Patient's Health History    Past Medical History:  Diagnosis Date  . Allergic rhinitis   . Arthritis    "back" (03/14/2013)  . Atrial fibrillation ([place])    persistent atrial fibrillation, prior GI bleeding, presently not anticoagulated  . CAROTID ARTERY DISEASE   . Chronic diastolic heart failure due to hypertrophic obstructive cardiomyopathy ([place])   . COPD    quit smoking 10/11  . Edema   . Exertional shortness of breath   . GASTRITIS    with GI bleeding  . High cholesterol   . History of blood transfusion 12/2009   "related to bypass OR to my legs"   . HYPERTENSION   . Pneumonia 03/14/2013   LLL; "never had it before today" (03/14/2013)  . PVD (peripheral vascular disease) ([place])    s/p aortobifemoral bypass 12/08/09    Past Surgical History:  Procedure Laterality Date  . AORTO-FEMORAL BYPASS GRAFT Bilateral 12/08/2009   aortobifemoral bypass 12/08/09 [Other]  . APPENDECTOMY    . CARDIAC CATHETERIZATION    . CATARACT EXTRACTION W/ INTRAOCULAR LENS  IMPLANT, BILATERAL Bilateral   . CHOLECYSTECTOMY    . LUMBAR LAMINECTOMY  03/13/2011   Procedure: MICRODISCECTOMY LUMBAR LAMINECTOMY;  Surgeon: Lawrence Santiago [facility];  Location: [place];  Service: Orthopedics;  Laterality: Bilateral;  Lumbar  3-4, lumbar 4-5, lumbar 5-sacrum 1 decompression.  Marland Kitchen PAROTID GLAND TUMOR EXCISION    . PARTIAL GASTRECTOMY     "stomach ulcer"   . SHOULDER OPEN ROTATOR CUFF REPAIR Right   . TOTAL ABDOMINAL HYSTERECTOMY     "i've had 2; they did a partial then a complete"    Family History  Problem Relation Age of Onset  . Coronary artery disease Mother   . Coronary artery disease Sister   . Stroke Father     Social History   Socioeconomic History  . Marital status: Widowed    Spouse name: Not on file  . Number of children: Not on file  . Years of education: Not on file  . Highest education level: Not on file  Occupational History  . Not on file  Tobacco Use  . Smoking status: Former Smoker    Packs/day: 2.00    Years: 57.00    Pack years: 114.00    Types: Cigarettes    Quit date: 12/21/2009    Years since quitting: 9.8  . Smokeless tobacco: Never Used  Substance and Sexual Activity  . Alcohol use: No  . Drug use: No  . Sexual activity: Never  Other Topics Concern  . Not on file  Social History Narrative   Pt lives in [place] with her son.  She is retired from Tourist information centre manager.  She smokes 1/2 pack a day (55+ pkyr), she quit 10/11  Social Determinants of Health   Financial Resource Strain:   . Difficulty of Paying Living Expenses:   Food Insecurity:   . Worried About Charity fundraiser in the Last Year:   . Arboriculturist in the Last Year:   Transportation Needs:   . Film/video editor (Medical):   Marland Kitchen Lack of Transportation (Non-Medical):   Physical Activity:   . Days of Exercise per Week:   . Minutes of Exercise per Session:   Stress:   . Feeling of Stress :   Social Connections:   . Frequency of Communication with Friends and Family:   . Frequency of Social Gatherings with Friends and Family:   . Attends Religious Services:   . Active Member of Clubs or Organizations:   . Attends Archivist Meetings:   Marland Kitchen Marital Status:   Intimate Partner Violence:   .  Fear of Current or Ex-Partner:   . Emotionally Abused:   Marland Kitchen Physically Abused:   . Sexually Abused:      Physical Exam   Vitals:   10/08/2019 1707 10/27/2019 2000  BP:  134/75  Pulse:  84  Resp:  (!) 35  Temp: 98.4 F (36.9 C)   SpO2:  100%    CONSTITUTIONAL: Chronically ill-appearing, NAD NEURO:  Alert and oriented x 3, no focal deficits EYES:  eyes equal and reactive ENT/NECK:  no LAD, no JVD CARDIO: Regular rate, well-perfused, normal S1 and S2 PULM: Scattered rhonchi GI/GU:  normal bowel sounds, non-distended, non-tender MSK/SPINE:  No gross deformities, no edema SKIN:  no rash, atraumatic PSYCH:  Appropriate speech and behavior  *Additional and/or pertinent findings included in MDM below  Diagnostic and Interventional Summary    EKG Interpretation  Date/Time:  Sunday October 09 2019 17:16:28 EDT Ventricular Rate:  102 PR Interval:    QRS Duration: 96 QT Interval:  410 QTC Calculation: 535 R Axis:   -40 Text Interpretation: Atrial fibrillation Ventricular premature complex Left axis deviation Anterior infarct, old Borderline ST depression, diffuse leads Prolonged QT interval Confirmed by Gerlene Fee [phone]) on 10/05/2019 5:43:52 PM      Labs Reviewed  SARS CORONAVIRUS 2 BY RT PCR (HOSPITAL ORDER, [facility]) - Abnormal; Notable for the following components:      Result Value   SARS Coronavirus 2 POSITIVE (*)    All other components within normal limits  CBC WITH DIFFERENTIAL/PLATELET - Abnormal; Notable for the following components:   WBC 14.9 (*)    RBC 3.85 (*)    Hemoglobin 11.4 (*)    Neutro Abs 13.6 (*)    Abs Immature Granulocytes 0.11 (*)    All other components within normal limits  COMPREHENSIVE METABOLIC PANEL - Abnormal; Notable for the following components:   Glucose, Bld 109 (*)    BUN 64 (*)    Creatinine, Ser 3.63 (*)    Calcium 8.8 (*)    Albumin 2.8 (*)    AST 125 (*)    ALT 69 (*)    GFR calc non Af Amer 11  (*)    GFR calc Af Amer 12 (*)    All other components within normal limits  D-DIMER, QUANTITATIVE (NOT AT [facility]) - Abnormal; Notable for the following components:   D-Dimer, Quant 1.91 (*)    All other components within normal limits  LACTATE DEHYDROGENASE - Abnormal; Notable for the following components:   LDH 237 (*)    All other components within normal  limits  FIBRINOGEN - Abnormal; Notable for the following components:   Fibrinogen 581 (*)    All other components within normal limits  TRIGLYCERIDES - Abnormal; Notable for the following components:   Triglycerides 175 (*)    All other components within normal limits  C-REACTIVE PROTEIN - Abnormal; Notable for the following components:   CRP 16.8 (*)    All other components within normal limits  TROPONIN I (HIGH SENSITIVITY) - Abnormal; Notable for the following components:   Troponin I (High Sensitivity) 29 (*)    All other components within normal limits  CULTURE, BLOOD (ROUTINE X 2)  CULTURE, BLOOD (ROUTINE X 2)  LACTIC ACID, PLASMA  LACTIC ACID, PLASMA  PROCALCITONIN  FERRITIN  TROPONIN I (HIGH SENSITIVITY)    DG Chest Port 1 View  Final Result      Medications  albuterol (VENTOLIN HFA) 108 (90 Base) MCG/ACT inhaler 2 puff (2 puffs Inhalation Given 10/02/2019 2007)  dexamethasone (DECADRON) injection 6 mg (6 mg Intravenous Given 10/23/2019 1839)     Procedures  /  Critical Care .Critical Care Performed by: Maudie Flakes, MD Authorized by: Maudie Flakes, MD   Critical care provider statement:    Critical care time (minutes):  32   Critical care was necessary to treat or prevent imminent or life-threatening deterioration of the following conditions: Hypoxic respiratory failure.   Critical care was time spent personally by me on the following activities:  Discussions with consultants, evaluation of patient's response to treatment, examination of patient, ordering and performing treatments and interventions, ordering and  review of laboratory studies, ordering and review of radiographic studies, pulse oximetry, re-evaluation of patient's condition, obtaining history from patient or surrogate and review of old charts    ED Course and Medical Decision Making  I have reviewed the triage vital signs, the nursing notes, and pertinent available records from the EMR.  Listed above are laboratory and imaging tests that I personally ordered, reviewed, and interpreted and then considered in my medical decision making (see below for details).      Suspect COVID-19 versus pneumonia versus COPD exacerbation, seems comfortable but requiring 4 L nasal cannula, providing IV Decadron, Covid test is pending.  Will need admission.  Patient has tested positive for Covid, will admit to hospital service.  Barth Kirks. Sedonia Small, MD [place] [email or web address]  Final Clinical Impressions(s) / ED Diagnoses     ICD-10-CM   1. COVID-19  U07.1   2. Acute respiratory failure with hypoxia ([place])  J96.01     ED Discharge Orders    None       Discharge Instructions Discussed with and Provided to Patient:   Discharge Instructions   None       Maudie Flakes, MD 10/28/2019 2039

## 2019-10-09 NOTE — ED Notes (Signed)
Pt provided ginger ale per MD Sedonia Small

## 2019-10-09 NOTE — ED Notes (Signed)
Pt weaned to 2 lpm o2 via [place], SPO2 98-100%, will continue to monitor

## 2019-10-10 ENCOUNTER — Inpatient Hospital Stay (HOSPITAL_COMMUNITY): Payer: Medicare Other

## 2019-10-10 ENCOUNTER — Telehealth: Payer: Self-pay | Admitting: Nurse Practitioner

## 2019-10-10 DIAGNOSIS — I5032 Chronic diastolic (congestive) heart failure: Secondary | ICD-10-CM | POA: Diagnosis present

## 2019-10-10 DIAGNOSIS — Z7189 Other specified counseling: Secondary | ICD-10-CM | POA: Diagnosis not present

## 2019-10-10 DIAGNOSIS — I10 Essential (primary) hypertension: Secondary | ICD-10-CM | POA: Diagnosis not present

## 2019-10-10 DIAGNOSIS — U071 COVID-19: Secondary | ICD-10-CM | POA: Diagnosis not present

## 2019-10-10 DIAGNOSIS — E870 Hyperosmolality and hypernatremia: Secondary | ICD-10-CM | POA: Diagnosis not present

## 2019-10-10 DIAGNOSIS — I517 Cardiomegaly: Secondary | ICD-10-CM | POA: Diagnosis not present

## 2019-10-10 DIAGNOSIS — E860 Dehydration: Secondary | ICD-10-CM | POA: Diagnosis not present

## 2019-10-10 DIAGNOSIS — J9602 Acute respiratory failure with hypercapnia: Secondary | ICD-10-CM | POA: Diagnosis present

## 2019-10-10 DIAGNOSIS — G92 Toxic encephalopathy: Secondary | ICD-10-CM | POA: Diagnosis not present

## 2019-10-10 DIAGNOSIS — J9601 Acute respiratory failure with hypoxia: Secondary | ICD-10-CM

## 2019-10-10 DIAGNOSIS — Z66 Do not resuscitate: Secondary | ICD-10-CM | POA: Diagnosis not present

## 2019-10-10 DIAGNOSIS — I4811 Longstanding persistent atrial fibrillation: Secondary | ICD-10-CM

## 2019-10-10 DIAGNOSIS — R4182 Altered mental status, unspecified: Secondary | ICD-10-CM | POA: Diagnosis not present

## 2019-10-10 DIAGNOSIS — J440 Chronic obstructive pulmonary disease with acute lower respiratory infection: Secondary | ICD-10-CM | POA: Diagnosis present

## 2019-10-10 DIAGNOSIS — N184 Chronic kidney disease, stage 4 (severe): Secondary | ICD-10-CM

## 2019-10-10 DIAGNOSIS — Z961 Presence of intraocular lens: Secondary | ICD-10-CM | POA: Diagnosis present

## 2019-10-10 DIAGNOSIS — I4819 Other persistent atrial fibrillation: Secondary | ICD-10-CM | POA: Diagnosis present

## 2019-10-10 DIAGNOSIS — G9389 Other specified disorders of brain: Secondary | ICD-10-CM | POA: Diagnosis not present

## 2019-10-10 DIAGNOSIS — I639 Cerebral infarction, unspecified: Secondary | ICD-10-CM | POA: Diagnosis not present

## 2019-10-10 DIAGNOSIS — E876 Hypokalemia: Secondary | ICD-10-CM | POA: Diagnosis not present

## 2019-10-10 DIAGNOSIS — R0602 Shortness of breath: Secondary | ICD-10-CM | POA: Diagnosis not present

## 2019-10-10 DIAGNOSIS — Z79899 Other long term (current) drug therapy: Secondary | ICD-10-CM | POA: Diagnosis not present

## 2019-10-10 DIAGNOSIS — R636 Underweight: Secondary | ICD-10-CM | POA: Diagnosis present

## 2019-10-10 DIAGNOSIS — I130 Hypertensive heart and chronic kidney disease with heart failure and stage 1 through stage 4 chronic kidney disease, or unspecified chronic kidney disease: Secondary | ICD-10-CM | POA: Diagnosis present

## 2019-10-10 DIAGNOSIS — Z79890 Hormone replacement therapy (postmenopausal): Secondary | ICD-10-CM | POA: Diagnosis not present

## 2019-10-10 DIAGNOSIS — Z515 Encounter for palliative care: Secondary | ICD-10-CM | POA: Diagnosis not present

## 2019-10-10 DIAGNOSIS — Z283 Underimmunization status: Secondary | ICD-10-CM | POA: Diagnosis not present

## 2019-10-10 DIAGNOSIS — E785 Hyperlipidemia, unspecified: Secondary | ICD-10-CM | POA: Diagnosis present

## 2019-10-10 DIAGNOSIS — R54 Age-related physical debility: Secondary | ICD-10-CM | POA: Diagnosis present

## 2019-10-10 DIAGNOSIS — I421 Obstructive hypertrophic cardiomyopathy: Secondary | ICD-10-CM | POA: Diagnosis present

## 2019-10-10 DIAGNOSIS — Z681 Body mass index (BMI) 19 or less, adult: Secondary | ICD-10-CM | POA: Diagnosis not present

## 2019-10-10 DIAGNOSIS — G319 Degenerative disease of nervous system, unspecified: Secondary | ICD-10-CM | POA: Diagnosis not present

## 2019-10-10 DIAGNOSIS — J1282 Pneumonia due to coronavirus disease 2019: Secondary | ICD-10-CM | POA: Diagnosis not present

## 2019-10-10 DIAGNOSIS — J90 Pleural effusion, not elsewhere classified: Secondary | ICD-10-CM | POA: Diagnosis not present

## 2019-10-10 LAB — COMPREHENSIVE METABOLIC PANEL
ALT: 71 U/L — ABNORMAL HIGH (ref 0–44)
AST: 127 U/L — ABNORMAL HIGH (ref 15–41)
Albumin: 2.6 g/dL — ABNORMAL LOW (ref 3.5–5.0)
Alkaline Phosphatase: 77 U/L (ref 38–126)
Anion gap: 14 (ref 5–15)
BUN: 61 mg/dL — ABNORMAL HIGH (ref 8–23)
CO2: 29 mmol/L (ref 22–32)
Calcium: 8.4 mg/dL — ABNORMAL LOW (ref 8.9–10.3)
Chloride: 99 mmol/L (ref 98–111)
Creatinine, Ser: 3.02 mg/dL — ABNORMAL HIGH (ref 0.44–1.00)
GFR calc Af Amer: 16 mL/min — ABNORMAL LOW (ref >60)
GFR calc non Af Amer: 13 mL/min — ABNORMAL LOW (ref >60)
Glucose, Bld: 119 mg/dL — ABNORMAL HIGH (ref 70–99)
Potassium: 3.7 mmol/L (ref 3.5–5.1)
Sodium: 142 mmol/L (ref 135–145)
Total Bilirubin: 0.6 mg/dL (ref 0.3–1.2)
Total Protein: 6.2 g/dL — ABNORMAL LOW (ref 6.5–8.1)

## 2019-10-10 LAB — CBC
HCT: 36.8 % (ref 36.0–46.0)
Hemoglobin: 11.1 g/dL — ABNORMAL LOW (ref 12.0–15.0)
MCH: 29.4 pg (ref 26.0–34.0)
MCHC: 30.2 g/dL (ref 30.0–36.0)
MCV: 97.4 fL (ref 80.0–100.0)
Platelets: 238 10*3/uL (ref 150–400)
RBC: 3.78 MIL/uL — ABNORMAL LOW (ref 3.87–5.11)
RDW: 14.6 % (ref 11.5–15.5)
WBC: 12 10*3/uL — ABNORMAL HIGH (ref 4.0–10.5)
nRBC: 0 % (ref 0.0–0.2)

## 2019-10-10 LAB — CBG MONITORING, ED
Glucose-Capillary: 113 mg/dL — ABNORMAL HIGH (ref 70–99)
Glucose-Capillary: 110 mg/dL — ABNORMAL HIGH (ref 70–99)
Glucose-Capillary: 111 mg/dL — ABNORMAL HIGH (ref 70–99)

## 2019-10-10 LAB — FERRITIN: Ferritin: 249 ng/mL (ref 11–307)

## 2019-10-10 LAB — D-DIMER, QUANTITATIVE: D-Dimer, Quant: 2.66 ug/mL-FEU — ABNORMAL HIGH (ref 0.00–0.50)

## 2019-10-10 LAB — C-REACTIVE PROTEIN: CRP: 13.2 mg/dL — ABNORMAL HIGH (ref <1.0)

## 2019-10-10 MED ORDER — ALBUTEROL SULFATE HFA 108 (90 BASE) MCG/ACT IN AERS
2.0000 | INHALATION_SPRAY | Freq: Four times a day (QID) | RESPIRATORY_TRACT | Status: DC
  Administered 2019-10-10 – 2019-10-12 (×8): 2 via RESPIRATORY_TRACT
  Filled 2019-10-10 (×2): qty 6.7

## 2019-10-10 MED ORDER — SENNA 8.6 MG PO TABS
1.0000 | ORAL_TABLET | Freq: Two times a day (BID) | ORAL | Status: DC
  Administered 2019-10-10 – 2019-10-15 (×11): 8.6 mg via ORAL
  Filled 2019-10-10 (×13): qty 1

## 2019-10-10 MED ORDER — PANTOPRAZOLE SODIUM 40 MG PO TBEC
40.0000 mg | DELAYED_RELEASE_TABLET | Freq: Every day | ORAL | Status: DC
  Administered 2019-10-10 – 2019-10-15 (×6): 40 mg via ORAL
  Filled 2019-10-10 (×6): qty 1

## 2019-10-10 MED ORDER — VITAMIN D 25 MCG (1000 UNIT) PO TABS
2000.0000 [IU] | ORAL_TABLET | Freq: Every day | ORAL | Status: DC
  Administered 2019-10-10 – 2019-10-15 (×5): 2000 [IU] via ORAL
  Filled 2019-10-10 (×6): qty 2

## 2019-10-10 MED ORDER — METHYLPREDNISOLONE SODIUM SUCC 125 MG IJ SOLR
50.0000 mg | Freq: Two times a day (BID) | INTRAMUSCULAR | Status: DC
  Administered 2019-10-10 – 2019-10-12 (×5): 50 mg via INTRAVENOUS
  Filled 2019-10-10 (×5): qty 2

## 2019-10-10 MED ORDER — SODIUM CHLORIDE 0.9 % IV SOLN
200.0000 mg | Freq: Once | INTRAVENOUS | Status: AC
  Administered 2019-10-10: 200 mg via INTRAVENOUS
  Filled 2019-10-10: qty 40

## 2019-10-10 MED ORDER — CALCITRIOL 0.25 MCG PO CAPS
0.2500 ug | ORAL_CAPSULE | ORAL | Status: DC
  Administered 2019-10-10 – 2019-10-14 (×3): 0.25 ug via ORAL
  Filled 2019-10-10 (×3): qty 1

## 2019-10-10 MED ORDER — HYDROCOD POLST-CPM POLST ER 10-8 MG/5ML PO SUER: 5.0000 mL | Freq: Two times a day (BID) | ORAL | Status: DC | PRN

## 2019-10-10 MED ORDER — ALLOPURINOL 100 MG PO TABS
100.0000 mg | ORAL_TABLET | Freq: Every day | ORAL | Status: DC
  Administered 2019-10-10 – 2019-10-15 (×6): 100 mg via ORAL
  Filled 2019-10-10 (×7): qty 1

## 2019-10-10 MED ORDER — SODIUM CHLORIDE 0.9 % IV SOLN: 250.0000 mL | INTRAVENOUS | Status: DC | PRN

## 2019-10-10 MED ORDER — AZITHROMYCIN 250 MG PO TABS
500.0000 mg | ORAL_TABLET | Freq: Every day | ORAL | Status: DC
  Administered 2019-10-10: 500 mg via ORAL
  Filled 2019-10-10: qty 2

## 2019-10-10 MED ORDER — CARVEDILOL 6.25 MG PO TABS
6.2500 mg | ORAL_TABLET | Freq: Two times a day (BID) | ORAL | Status: DC
  Administered 2019-10-10 – 2019-10-15 (×12): 6.25 mg via ORAL
  Filled 2019-10-10 (×9): qty 1
  Filled 2019-10-10: qty 2
  Filled 2019-10-10 (×2): qty 1

## 2019-10-10 MED ORDER — SODIUM CHLORIDE 0.9% FLUSH
3.0000 mL | Freq: Two times a day (BID) | INTRAVENOUS | Status: DC
  Administered 2019-10-10 – 2019-10-12 (×5): 3 mL via INTRAVENOUS

## 2019-10-10 MED ORDER — DIPHENHYDRAMINE HCL 25 MG PO CAPS
25.0000 mg | ORAL_CAPSULE | Freq: Every day | ORAL | Status: DC
  Administered 2019-10-10 – 2019-10-14 (×6): 25 mg via ORAL
  Filled 2019-10-10 (×6): qty 1

## 2019-10-10 MED ORDER — ROPINIROLE HCL 1 MG PO TABS
0.5000 mg | ORAL_TABLET | Freq: Every day | ORAL | Status: DC
  Administered 2019-10-10 – 2019-10-14 (×6): 0.5 mg via ORAL
  Filled 2019-10-10 (×8): qty 1

## 2019-10-10 MED ORDER — SODIUM CHLORIDE 0.9 % IV SOLN: 1.0000 g | INTRAVENOUS | Status: DC

## 2019-10-10 MED ORDER — ATORVASTATIN CALCIUM 10 MG PO TABS
20.0000 mg | ORAL_TABLET | Freq: Every day | ORAL | Status: DC
  Administered 2019-10-10 – 2019-10-15 (×6): 20 mg via ORAL
  Filled 2019-10-10 (×6): qty 2

## 2019-10-10 MED ORDER — SODIUM CHLORIDE 0.9% FLUSH: 3.0000 mL | INTRAVENOUS | Status: DC | PRN

## 2019-10-10 MED ORDER — BENZONATATE 100 MG PO CAPS
200.0000 mg | ORAL_CAPSULE | Freq: Three times a day (TID) | ORAL | Status: DC
  Administered 2019-10-10 – 2019-10-15 (×14): 200 mg via ORAL
  Filled 2019-10-10 (×18): qty 2

## 2019-10-10 MED ORDER — VITAMIN B-12 1000 MCG PO TABS
1000.0000 ug | ORAL_TABLET | Freq: Every day | ORAL | Status: DC
  Administered 2019-10-10 – 2019-10-15 (×5): 1000 ug via ORAL
  Filled 2019-10-10 (×6): qty 1

## 2019-10-10 MED ORDER — SODIUM CHLORIDE 0.9 % IV SOLN
1.0000 g | Freq: Once | INTRAVENOUS | Status: AC
  Administered 2019-10-10: 1 g via INTRAVENOUS
  Filled 2019-10-10: qty 10

## 2019-10-10 MED ORDER — AMIODARONE HCL 200 MG PO TABS
200.0000 mg | ORAL_TABLET | Freq: Every day | ORAL | Status: DC
  Administered 2019-10-10 – 2019-10-15 (×6): 200 mg via ORAL
  Filled 2019-10-10 (×6): qty 1

## 2019-10-10 MED ORDER — FLUTICASONE FUROATE-VILANTEROL 200-25 MCG/INH IN AEPB
1.0000 | INHALATION_SPRAY | Freq: Every day | RESPIRATORY_TRACT | Status: DC
  Administered 2019-10-12 – 2019-10-16 (×5): 1 via RESPIRATORY_TRACT
  Filled 2019-10-10: qty 28

## 2019-10-10 MED ORDER — INSULIN ASPART 100 UNIT/ML ~~LOC~~ SOLN
0.0000 [IU] | Freq: Three times a day (TID) | SUBCUTANEOUS | Status: DC
  Administered 2019-10-11: 2 [IU] via SUBCUTANEOUS
  Administered 2019-10-12 (×2): 3 [IU] via SUBCUTANEOUS
  Administered 2019-10-13 – 2019-10-16 (×6): 2 [IU] via SUBCUTANEOUS

## 2019-10-10 MED ORDER — SODIUM CHLORIDE 0.9 % IV SOLN
100.0000 mg | Freq: Every day | INTRAVENOUS | Status: AC
  Administered 2019-10-11 – 2019-10-14 (×4): 100 mg via INTRAVENOUS
  Filled 2019-10-10 (×4): qty 20

## 2019-10-10 MED ORDER — DEXAMETHASONE 4 MG PO TABS: 6.0000 mg | ORAL_TABLET | ORAL | Status: DC

## 2019-10-10 MED ORDER — ACETAMINOPHEN 500 MG PO TABS
1000.0000 mg | ORAL_TABLET | Freq: Every day | ORAL | Status: DC
  Administered 2019-10-10: 500 mg via ORAL
  Administered 2019-10-10 – 2019-10-14 (×5): 1000 mg via ORAL
  Filled 2019-10-10 (×7): qty 2

## 2019-10-10 MED ORDER — APIXABAN 2.5 MG PO TABS
2.5000 mg | ORAL_TABLET | Freq: Two times a day (BID) | ORAL | Status: DC
  Administered 2019-10-10 – 2019-10-15 (×12): 2.5 mg via ORAL
  Filled 2019-10-10 (×13): qty 1

## 2019-10-10 MED ORDER — FUROSEMIDE 20 MG PO TABS
40.0000 mg | ORAL_TABLET | Freq: Every day | ORAL | Status: DC
  Administered 2019-10-10: 40 mg via ORAL
  Filled 2019-10-10: qty 2

## 2019-10-10 MED ORDER — LEVOTHYROXINE SODIUM 75 MCG PO TABS
75.0000 ug | ORAL_TABLET | Freq: Every day | ORAL | Status: DC
  Administered 2019-10-10 – 2019-10-14 (×5): 75 ug via ORAL
  Filled 2019-10-10 (×6): qty 1

## 2019-10-10 NOTE — Telephone Encounter (Signed)
Patient currently hospitalized with hypoxic respiratory failure. No indications for outpatient [facility] given hospitalization.

## 2019-10-10 NOTE — ED Notes (Signed)
Lunch Tray Ordered @ 1033. 

## 2019-10-10 NOTE — H&P (Signed)
History and Physical    Brenda [name] TKW:[id number] DOB: 1931/12/02 DOA: 10/20/2019  PCP: Charolette Forward, MD (Confirm with patient/family/NH records and if not entered, this has to be entered at [facility] point of entry) Patient coming from: home  I have personally briefly reviewed patient's old medical records in [place]  Chief Complaint: cough, SOB, myalgias, loss of smell and taste, weakness  HPI: Brenda [name] is a 86 y.o. female with medical history significant of Chronic HFpEF, a. fib, COPD, HTN, HLD who has had a 7 day h/o cough, increased SOB, myalgias, loss of taste and smell and progressive weakness. She presents to [facility] for evaluation  ED Course: Afebrile, 133/90, RR 31, rrequired 4l [place] to keep O2 sat >90%. Lab revealed Cr 3.63, baseline of 3.04 Jan '15, BUN 64, mild elevation LFTs, LDH 237, ferritin 246, CRP 16.8, lactic acid 1.0, D-dimer 1.91, Procalcitonin 0.31. Leukocytosis to 14.9 with 90/5/4. CXR with COPD w/o acute opacities or infiltrates. Covid +. In ED she has been given IV decadron and Rocephin 1 g IV. TRH is called to admit for continued tx covid 19 PNA.  Review of Systems: As per HPI otherwise 10 point review of systems negative.    Past Medical History:  Diagnosis Date   Allergic rhinitis    Arthritis    "back" (03/14/2013)   Atrial fibrillation (H[facility])    persistent atrial fibrillation, prior GI bleeding, presently not anticoagulated   CAROTID ARTERY DISEASE    Chronic diastolic heart failure due to hypertrophic obstructive cardiomyopathy ([place])    COPD    quit smoking 10/11   Edema    Exertional shortness of breath    GASTRITIS    with GI bleeding   High cholesterol    History of blood transfusion 12/2009   "related to bypass OR to my legs"    HYPERTENSION    Pneumonia 03/14/2013   LLL; "never had it before today" (03/14/2013)   PVD (peripheral vascular disease) ([place])    s/p aortobifemoral bypass 12/08/09    Past Surgical History:   Procedure Laterality Date   AORTO-FEMORAL BYPASS GRAFT Bilateral 12/08/2009   aortobifemoral bypass 12/08/09 [Other]   APPENDECTOMY     CARDIAC CATHETERIZATION     CATARACT EXTRACTION W/ INTRAOCULAR LENS  IMPLANT, BILATERAL Bilateral    CHOLECYSTECTOMY     LUMBAR LAMINECTOMY  03/13/2011   Procedure: MICRODISCECTOMY LUMBAR LAMINECTOMY;  Surgeon: Sinclair Ship;  Location: [place];  Service: Orthopedics;  Laterality: Bilateral;  Lumbar 3-4, lumbar 4-5, lumbar 5-sacrum 1 decompression.   PAROTID GLAND TUMOR EXCISION     PARTIAL GASTRECTOMY     "stomach ulcer"    SHOULDER OPEN ROTATOR CUFF REPAIR Right    TOTAL ABDOMINAL HYSTERECTOMY     "i've had 2; they did a partial then a complete"   Soc Hx -  1st marriage 71 yr then widowed. 2nd marriage 16 years then widowed. She has 2 sons and many grandchildren. She did work but has been retired for many years. She lives with her son.    reports that she quit smoking about 9 years ago. Her smoking use included cigarettes. She has a 114.00 pack-year smoking history. She has never used smokeless tobacco. She reports that she does not drink alcohol and does not use drugs.  Allergies  Allergen Reactions   Nsaids Other (See Comments)    Sick    Sulfonamide Derivatives Other (See Comments)    Sick     Family History  Problem Relation Age of Onset   Coronary artery disease Mother    Coronary artery disease Sister    Stroke Father      Prior to Admission medications   Medication Sig Start Date End Date Taking? Authorizing Provider  acetaminophen (TYLENOL) 500 MG tablet Take 1,000 mg by mouth at bedtime.   Yes [provider]  allopurinol (ZYLOPRIM) 100 MG tablet Take 100 mg by mouth daily. 09/16/19  Yes [provider]  amiodarone (PACERONE) 200 MG tablet Take 200 mg by mouth daily.   Yes [provider]  apixaban (ELIQUIS) 2.5 MG TABS tablet Take 2.5 mg by mouth 2 (two) times daily.   Yes [provider]  atorvastatin (LIPITOR) 20 MG tablet Take 20 mg by mouth daily.   Yes [provider]  calcitRIOL (ROCALTROL) 0.25 MCG capsule Take 0.25 mcg by mouth every Monday, Wednesday, and Friday. 09/16/19  Yes [provider]  carvedilol (COREG) 6.25 MG tablet Take 6.25 mg by mouth 2 (two) times daily. 10/06/19  Yes [provider]  Cholecalciferol (VITAMIN D) 50 MCG (2000 UT) tablet Take 2,000 Units by mouth daily.   Yes [provider]  diphenhydrAMINE (BENADRYL) 25 MG tablet Take 25 mg by mouth at bedtime.   Yes [provider]  fluticasone furoate-vilanterol (BREO ELLIPTA) 200-25 MCG/INH AEPB Inhale 1 puff into the lungs daily.   Yes [provider]  furosemide (LASIX) 40 MG tablet Take 1 tablet (40 mg total) by mouth daily. 03/24/13  Yes Charolette Forward, MD  levothyroxine (EUTHYROX) 75 MCG tablet Take 75 mcg by mouth daily before breakfast.   Yes [provider]  pantoprazole (PROTONIX) 40 MG tablet Take 1 tablet (40 mg total) by mouth daily. 03/24/13  Yes Charolette Forward, MD  rOPINIRole (REQUIP) 0.5 MG tablet Take 0.5 mg by mouth at bedtime. 09/30/19  Yes [provider]  vitamin B-12 (CYANOCOBALAMIN) 1000 MCG tablet Take 1,000 mcg by mouth daily.   Yes [provider]    Physical Exam: Vitals:   10/21/2019 2130 10/28/2019 2300 10/10/19 0000 10/10/19 0018  BP: (!) 138/95 133/90 134/90   Pulse: 100 (!) 146  87  Resp:    (!) 52  Temp:      TempSrc:      SpO2: 100% 96%  100%  Weight:      Height:        Constitutional: NAD, calm, comfortable Vitals:   10/27/2019 2130 10/31/2019 2300 10/10/19 0000 10/10/19 0018  BP: (!) 138/95 133/90 134/90   Pulse: 100 (!) 146  87  Resp:    (!) 52  Temp:      TempSrc:      SpO2: 100% 96%  100%  Weight:      Height:       General: elderly, frail appearing woman with wet cough but does seem distressed. Eyes: PERRL, question of s/p cataract surgery, lids and conjunctivae  normal ENMT: Mucous membranes are moist.  Neck: normal, supple, no masses, no thyromegaly Respiratory:  Decreased breath sounds. Prolonged I/E ratio with expiratory wheeze, feint rales at bases. No increased WOB - no accessory muscle use or neck retractions. Cardiovascular: IRIR at rate of 90-100, no murmurs / rubs / gallops. No extremity edema. trace pedal pulses and feet are cool. No carotid bruits.  Abdomen: no tenderness,  mass palpated just above umbilicus - firm and mobile.. No hepatosplenomegaly. Bowel sounds positive.  Musculoskeletal: no clubbing / cyanosis. No joint deformity upper extremities except  interosseous wasting hands,  lower extremitiesnormal . Good ROM, no contractures. Decreased muscle tone.  Skin: no rashes, lesions, ulcers. No induration Neurologic: CN 2-12 grossly intact. Sensation intact,  Strength 4/5 in all 4.  Psychiatric: Normal judgment and insight. Alert and oriented x 3. Normal mood.     Labs on Admission: I have personally reviewed following labs and imaging studies  CBC: Recent Labs  Lab 10/12/2019 1802  WBC 14.9*  NEUTROABS 13.6*  HGB 11.4*  HCT 37.5  MCV 97.4  PLT 270   Basic Metabolic Panel: Recent Labs  Lab 10/21/2019 1802  NA 141  K 3.5  CL 99  CO2 29  GLUCOSE 109*  BUN 64*  CREATININE 3.63*  CALCIUM 8.8*   GFR: Estimated Creatinine Clearance: 9.2 mL/min (A) (by C-G formula based on SCr of 3.63 mg/dL (H)). Liver Function Tests: Recent Labs  Lab 10/07/2019 1802  AST 125*  ALT 69*  ALKPHOS 82  BILITOT 0.6  PROT 6.7  ALBUMIN 2.8*   No results for input(s): LIPASE, AMYLASE in the last 168 hours. No results for input(s): AMMONIA in the last 168 hours. Coagulation Profile: No results for input(s): INR, PROTIME in the last 168 hours. Cardiac Enzymes: No results for input(s): CKTOTAL, CKMB, CKMBINDEX, TROPONINI in the last 168 hours. BNP (last 3 results) No results for input(s): PROBNP in the last 8760 hours. HbA1C: No results for  input(s): HGBA1C in the last 72 hours. CBG: No results for input(s): GLUCAP in the last 168 hours. Lipid Profile: Recent Labs    10/12/2019 1802  TRIG 175*   Thyroid Function Tests: No results for input(s): TSH, T4TOTAL, FREET4, T3FREE, THYROIDAB in the last 72 hours. Anemia Panel: Recent Labs    10/28/2019 1846  FERRITIN 246   Urine analysis:    Component Value Date/Time   COLORURINE YELLOW 03/14/2013 2010   APPEARANCEUR CLOUDY (A) 03/14/2013 2010   LABSPEC 1.019 03/14/2013 2010   PHURINE 5.0 03/14/2013 2010   GLUCOSEU NEGATIVE 03/14/2013 2010   HGBUR SMALL (A) 03/14/2013 2010   BILIRUBINUR SMALL (A) 03/14/2013 2010   KETONESUR 15 (A) 03/14/2013 2010   PROTEINUR 30 (A) 03/14/2013 2010   UROBILINOGEN 1.0 03/14/2013 2010   NITRITE NEGATIVE 03/14/2013 2010   LEUKOCYTESUR SMALL (A) 03/14/2013 2010    Radiological Exams on Admission: DG Chest Port 1 View  Result Date: 10/23/2019 CLINICAL DATA:  Wet cough, crackles, hypoxia EXAM: PORTABLE CHEST 1 VIEW COMPARISON:  06/05/2015 FINDINGS: Mild cardiomegaly. Atherosclerotic calcification of the aortic knob. Hyperexpanded lungs with coarsened interstitial markings bilaterally. No definite airspace consolidation. No pleural effusion or pneumothorax. IMPRESSION: Chronic findings of COPD without evidence of acute superimposed cardiopulmonary process. Electronically Signed   By: Davina Poke D.O.   On: 11/01/2019 18:00    EKG: Independently reviewed. A. Fib with rate of 105, old anterior injury, prolonged QT interval, No acute changes.   Assessment/Plan Active Problems:   Pneumonia due to COVID-19 virus   Essential hypertension   Atrial fibrillation (HCC)  (please populate well all problems here in Problem List. (For example, if patient is on BP meds at home and you resume or decide to hold them, it is a problem that needs to be her. Same for CAD, COPD, HLD and so on)   1. Covid 19 PNA - CXR unremarkable but patient with wet cough,  abnormal pulmonary exam. May have multifactorial findings: COPD, possible bacterial infection plus covid 19 PNA Plan Med-surg admit  Decadron 6 mg daily  Remdesivir per  pharmacy  Rocephin 1g q 24  Azithromycin orally daily  Routine f/u lab  O2 to keep Sat >90%  2. COPD - will continue home inhalational medications  3. HTN- continue home meds  4. A. Fib - stable rate with chronic a fib Plan Continue home meds including amiodarone  Continue eliquis  5. Chronic heart failure - no sign of decompensation Plan Continue home regimen  6. Code status - discussed with patient- gave rough odds of successful resuscitation and the aftermath of success. Originally wanted Cardiac resuscitation for a short time and to stop if not successful but after discussion wanted to forego attempted resuscitation. Discussed this with her son who was uncomfortable with a DNR status preferring to continue as full code until he has a chance to talk with her in the AM  DVT prophylaxis: eliquis Code Status: full code  Family Communication: spoke with son Lucienne Minks giving full update on dx and tx Disposition Plan: TBD  Consults called: none (with names) Admission status: inpatient    Brenda Hare MD [facility] Pager [phone]  If 7PM-7AM, please contact night-coverage [email or web address] Password [facility]  10/10/2019, 12:44 AM  home

## 2019-10-10 NOTE — ED Notes (Signed)
SLP eval in progress.

## 2019-10-10 NOTE — ED Notes (Addendum)
PT able to take a few bites of apple sauce with meds. PT had to be coaxed to take a few bites of dinner and few sips of milk.

## 2019-10-10 NOTE — Evaluation (Signed)
Clinical/Bedside Swallow Evaluation Patient Details  Name: Brenda [name] MRN: [id number] Date of Birth: 16-Nov-1931  Today's Date: 10/10/2019 Time: SLP Start Time (ACUTE ONLY): 42 SLP Stop Time (ACUTE ONLY): 1610 SLP Time Calculation (min) (ACUTE ONLY): 20 min  Past Medical History:  Past Medical History:  Diagnosis Date  . Allergic rhinitis   . Arthritis    "back" (03/14/2013)  . Atrial fibrillation ([place])    persistent atrial fibrillation, prior GI bleeding, presently not anticoagulated  . CAROTID ARTERY DISEASE   . Chronic diastolic heart failure due to hypertrophic obstructive cardiomyopathy ([place])   . COPD    quit smoking 10/11  . Edema   . Exertional shortness of breath   . GASTRITIS    with GI bleeding  . High cholesterol   . History of blood transfusion 12/2009   "related to bypass OR to my legs"   . HYPERTENSION   . Pneumonia 03/14/2013   LLL; "never had it before today" (03/14/2013)  . PVD (peripheral vascular disease) ([place])    s/p aortobifemoral bypass 12/08/09   Past Surgical History:  Past Surgical History:  Procedure Laterality Date  . AORTO-FEMORAL BYPASS GRAFT Bilateral 12/08/2009   aortobifemoral bypass 12/08/09 [Other]  . APPENDECTOMY    . CARDIAC CATHETERIZATION    . CATARACT EXTRACTION W/ INTRAOCULAR LENS  IMPLANT, BILATERAL Bilateral   . CHOLECYSTECTOMY    . LUMBAR LAMINECTOMY  03/13/2011   Procedure: MICRODISCECTOMY LUMBAR LAMINECTOMY;  Surgeon: Lawrence Santiago [facility];  Location: [place];  Service: Orthopedics;  Laterality: Bilateral;  Lumbar 3-4, lumbar 4-5, lumbar 5-sacrum 1 decompression.  Marland Kitchen PAROTID GLAND TUMOR EXCISION    . PARTIAL GASTRECTOMY     "stomach ulcer"   . SHOULDER OPEN ROTATOR CUFF REPAIR Right   . TOTAL ABDOMINAL HYSTERECTOMY     "i've had 2; they did a partial then a complete"   HPI:  86 y.o. female with PMHx of chronic diastolic heart failure, COPD, HTN, stage IV CKD, HLD-who presented with a 7-day history of weakness, worsening  shortness of breath-she was found to have acute hypoxic respiratory failure in the setting of COVID-19 infection (unvaccinated). Pt reported difficulty swallowing lunch tray; Swallow evaluation ordered. Pt remains in ED.    Assessment / Plan / Recommendation Clinical Impression  Pt presents with functional oropharyngeal swallow with some signs of potential esophageal dysphagia (belching, regurgitation) and c/o sore throat.  She c/o pain in roof of mouth and throat upon swallowing, regardless of consistency.  There were no overt s/s of aspiration with thin and nectar thick liquids, nor applesauce.  She declined graham crackers because of her concern they would be painful to swallow. She denies hx of GERD, heartburn, globus.  Her son, Chrissie Noa, who called his mother during assessment, reported no hx of swallowing difficulty in the past.  Pt's RR was in the high 20s; on 2L [place] and oxygenating >96%.  There were no indications of a dyssynchrony between swallow/respiratory cycles. There were no focal deficits and oral mechanism exam was normal.  Recommend continuing current diet of regular solids/thin liquids for now to allow greater options.  Give meds whole in applesauce. SLP will f/u briefly to ensure safety/toleration given pt's deconditioned state, frailty, and current dx.  D/W RN.  SLP Visit Diagnosis: Dysphagia, unspecified (R13.10)    Aspiration Risk  No limitations    Diet Recommendation   regular solids, thin liquids  Medication Administration: Whole meds with puree    Other  Recommendations Oral Care Recommendations: Oral  care BID   Follow up Recommendations None      Frequency and Duration min 1 x/week  1 week       Prognosis        Swallow Study   General Date of Onset: 10/14/2019 HPI: 86 y.o. female with PMHx of chronic diastolic heart failure, COPD, HTN, stage IV CKD, HLD-who presented with a 7-day history of weakness, worsening shortness of breath-she was found to have acute  hypoxic respiratory failure in the setting of COVID-19 infection (unvaccinated). Pt reported difficulty swallowing lunch tray; Swallow evaluation ordered. Pt remains in ED.  Type of Study: Bedside Swallow Evaluation Previous Swallow Assessment: no Diet Prior to this Study: NPO Temperature Spikes Noted: No Respiratory Status: Nasal cannula (2L [place]) History of Recent Intubation: No Behavior/Cognition: Alert;Cooperative Oral Cavity Assessment: Within Functional Limits Oral Care Completed by SLP: Recent completion by staff Oral Cavity - Dentition: Dentures, top;Dentures, bottom Vision: Functional for self-feeding Self-Feeding Abilities: Able to feed self Patient Positioning: Upright in bed Baseline Vocal Quality: Normal Volitional Cough: Strong Volitional Swallow: Able to elicit    Oral/Motor/Sensory Function Overall Oral Motor/Sensory Function: Within functional limits   Ice Chips Ice chips: Within functional limits   Thin Liquid Thin Liquid: Within functional limits    Nectar Thick Nectar Thick Liquid: Within functional limits   Honey Thick Honey Thick Liquid: Not tested   Puree Puree: Within functional limits   Solid     Solid:  (pt declined)      Juan Quam Laurice 10/10/2019,4:43 PM  Estill Bamberg L. Tivis Ringer, [place] Office number [phone] Pager [phone]

## 2019-10-10 NOTE — ED Notes (Signed)
Supplemental O2 decreased to 1.5L by Dr. Sloan Leiter. Pt currently 92%.

## 2019-10-10 NOTE — ED Notes (Signed)
Breakfast delivered  

## 2019-10-10 NOTE — ED Notes (Signed)
Lunch delivered. 

## 2019-10-10 NOTE — ED Notes (Addendum)
Pt coughing up water and one pill following morning medication administration. Head of bed elevated to high Fowler's, vital signs stable. MD notified of incident, SLP eval ordered to assess swallowing and aspiration risk.

## 2019-10-10 NOTE — ED Notes (Signed)
Call son Clinton Sawyer at [phone] with an update

## 2019-10-10 NOTE — ED Notes (Signed)
Patient reports worsening dysphagia when attempting to eat small soft bites from meal tray. Meal tray removed from bedside. Patient in high Fowler's position, vital signs stable, no acute distress. MD notified of worsening dysphagia. Diet order changed to NPO except for sips with meds. Acute rehab contacted by RN to determine timeframe for evaluation; SLP paged to visit patient. RN will notify MD of SLP recommendations following assessment. RN to closely monitor patient during and following oral medication administration and notify MD of need for alternate medication orders if necessary.

## 2019-10-10 NOTE — Progress Notes (Addendum)
PROGRESS NOTE                                                                                                                                                                                                             Patient Demographics:    Brenda [name], is a 86 y.o. female, DOB - 1931/04/04, JOI:[id number]  Outpatient Primary MD for the patient is Charolette Forward, MD   Admit date - 10/10/2019   LOS - 0  No chief complaint on file.      Brief Narrative: Patient is a 86 y.o. female with PMHx of chronic diastolic heart failure, COPD, HTN, stage IV CKD, HLD-who presented with a 7-day history of weakness, worsening shortness of breath-she was found to have acute hypoxic respiratory failure in the setting of COVID-19 infection (unvaccinated)  Significant Events: 8/7>> Admit to [facility] for for hypoxia secondary to COVID-19 pneumonia  Significant studies: 8/8>>Chest x-ray: Chronic findings of COPD without evidence of acute superimposed cardiopulmonary process. 8/9>> chest x-ray: No acute disease-findings suggestive of COPD with chronic interstitial changes at left lung base  COVID-19 medications: Steroids: 8/8>> Remdesivir: 8/8>>  Antibiotics: Rocephin: 8/8>>8/9 Zithromax: 8/9>>8/9  Microbiology data: 8/8 >>blood culture: No growth  Procedures: None  Consults: None  DVT prophylaxis: apixaban (ELIQUIS) tablet 2.5 mg Start: 10/10/19 0015 apixaban (ELIQUIS) tablet 2.5 mg      Subjective:    Brenda [name] today feels essentially the same-remains on 2 L of oxygen.  She appears very frail and debilitated at baseline.   Assessment  & Plan :   Acute Hypoxic Resp Failure due to Covid 19 Viral pneumonia: Suspect has pneumonia clinically-even though chest x-ray without any obvious infiltrates-suspect pneumonia is from COVID-19 infection rather than a bacterial process.  Plans are to continue with steroids and  Remdesivir-do not think she requires antimicrobial therapy hence will discontinue both Rocephin and Zithromax.  Monitor closely-encourage incentive spirometry/flutter valve-ambulate with PT/OT/nursing staff.  Continue attempts to slowly titrate down FiO2.  Fever: afebrile O2 requirements:  SpO2: 96 % O2 Flow Rate (L/min): 3 L/min   COVID-19 Labs: Recent Labs    10/15/2019 1802 10/22/2019 1846 10/10/19 0700  DDIMER 1.91*  --  2.66*  FERRITIN  --  246 249  LDH 237*  --   --   CRP  --  16.8*  --  No results found for: BNP  Recent Labs  Lab 10/07/2019 1802  PROCALCITON 0.31    Lab Results  Component Value Date   SARSCOV2NAA POSITIVE (A) 10/08/2019    Prone/Incentive Spirometry: encouraged  incentive spirometry use 3-4/hour.  Transaminitis: Likely secondary to COVID-19-await labs this morning-stable for follow-up for now.  COPD: Not in exacerbation-continue bronchodilators.  HTN: BP relatively stable-does not appear to be on any antihypertensives as an outpatient-watch for now-plan to start amlodipine if BP remains significantly elevated.  Chronic atrial fibrillation: Rate controlled with amiodarone-remains on Eliquis  Chronic diastolic heart failure: Volume status remains stable-hold furosemide.  CKD stage IV: Apparently follows with Dr. Lars Pinks repeat labs-do not see any urgent indication for HD-though labs still pending.  Volume status stable.  Debility/deconditioning-appears to be very frail/weak-likely secondary to acute illness from COVID-19-also has significant underlying medical comorbidities-have ordered PT/OT.  Palliative care discussion: 86 year old elderly patient with CKD stage IV, chronic diastolic heart failure, COPD-admitted with COVID-19 pneumonia with mild to moderate hypoxemia.  She appears incredibly frail and debilitated.  Brief discussion with patient this morning-for now she wishes to be a full code-have asked her to start discussion with family  regarding advanced directives.  She is not a great candidate for aggressive care if she were to have severe disease from COVID-19.  Have spoken with the patient's son-he is aware of the potential to decline-family discussion in progress regarding goals of care/advanced directives if she were to decline significantly.  ABG:    Component Value Date/Time   PHART 7.377 03/17/2013 0138   PCO2ART 50.6 (H) 03/17/2013 0138   PO2ART 89.0 03/17/2013 0138   HCO3 29.9 (H) 03/17/2013 0138   TCO2 31 03/17/2013 0138   ACIDBASEDEF 0.7 03/17/2013 0008   O2SAT 97.0 03/17/2013 0138    Vent Settings: N/A  Condition - Guarded  Family Communication  :  Son updated over the phone [phone]  Code Status :  Full Code  Diet :  Diet Order            Diet regular Room service appropriate? Yes; Fluid consistency: Thin  Diet effective now                  Disposition Plan  :   Status is: Inpatient  Remains inpatient appropriate because:Inpatient level of care appropriate due to severity of illness   Dispo: The patient is from: Home              Anticipated d/c is to: TBD              Anticipated d/c date is: > 3 days              Patient currently is not medically stable to d/c.   Barriers to discharge: Hypoxia requiring O2 supplementation/complete 5 days of IV Remdesivir  Antimicorbials  :    Anti-infectives (From admission, onward)   Start     Dose/Rate Route Frequency Ordered Stop   10/11/19 1000  remdesivir 100 mg in sodium chloride 0.9 % 100 mL IVPB     Discontinue    "Followed by" Linked Group Details   100 mg 200 mL/hr over 30 Minutes Intravenous Daily 10/10/19 0011 10/15/19 0959   10/10/19 2200  cefTRIAXone (ROCEPHIN) 1 g in sodium chloride 0.9 % 100 mL IVPB     Discontinue     1 g 200 mL/hr over 30 Minutes Intravenous Every 24 hours 10/10/19 0011     10/10/19 1000  azithromycin (ZITHROMAX)  tablet 500 mg     Discontinue     500 mg Oral Daily 10/10/19 0011     10/10/19 0045   remdesivir 200 mg in sodium chloride 0.9% 250 mL IVPB       "Followed by" Linked Group Details   200 mg 580 mL/hr over 30 Minutes Intravenous Once 10/10/19 0011 10/10/19 0152   10/10/19 0030  cefTRIAXone (ROCEPHIN) 1 g in sodium chloride 0.9 % 100 mL IVPB        1 g 200 mL/hr over 30 Minutes Intravenous  Once 10/10/19 0028 10/10/19 0141      Inpatient Medications  Scheduled Meds: . acetaminophen  1,000 mg Oral QHS  . albuterol  2 puff Inhalation Q6H  . allopurinol  100 mg Oral Daily  . amiodarone  200 mg Oral Daily  . apixaban  2.5 mg Oral BID  . atorvastatin  20 mg Oral Daily  . azithromycin  500 mg Oral Daily  . benzonatate  200 mg Oral TID  . calcitRIOL  0.25 mcg Oral Q M,W,F  . carvedilol  6.25 mg Oral BID WC  . cholecalciferol  2,000 Units Oral Daily  . diphenhydrAMINE  25 mg Oral QHS  . fluticasone furoate-vilanterol  1 puff Inhalation Daily  . furosemide  40 mg Oral Daily  . insulin aspart  0-15 Units Subcutaneous TID WC  . levothyroxine  75 mcg Oral Q0600  . methylPREDNISolone (SOLU-MEDROL) injection  50 mg Intravenous Q12H  . pantoprazole  40 mg Oral Daily  . rOPINIRole  0.5 mg Oral QHS  . senna  1 tablet Oral BID  . sodium chloride flush  3 mL Intravenous Q12H  . vitamin B-12  1,000 mcg Oral Daily   Continuous Infusions: . sodium chloride    . cefTRIAXone (ROCEPHIN)  IV    . [START ON 10/11/2019] remdesivir 100 mg in NS 100 mL     PRN Meds:.sodium chloride, chlorpheniramine-HYDROcodone, sodium chloride flush   Time Spent in minutes  35    See all Orders from today for further details   Oren Binet M.D on 10/10/2019 at 11:11 AM  To page go to [email or web address] - use universal password  [facility] -  Office  [phone]    Objective:   Vitals:   10/10/19 0645 10/10/19 0845 10/10/19 0914 10/10/19 1000  BP: (!) 151/91 (!) 138/92 (!) 144/78 (!) 146/89  Pulse: 88 89 (!) 105 90  Resp:      Temp:      TempSrc:      SpO2: 100% 94% 92% 96%    Weight:      Height:        Wt Readings from Last 3 Encounters:  10/27/2019 52.2 kg  06/05/15 56.2 kg  03/24/13 58.5 kg    No intake or output data in the 24 hours ending 10/10/19 1111   Physical Exam Gen Exam:Alert awake-not in any distress HEENT:atraumatic, normocephalic Chest: B/L clear to auscultation anteriorly CVS:S1S2 regular Abdomen:soft non tender, non distended Extremities:no edema Neurology: Non focal Skin: no rash   Data Review:    CBC Recent Labs  Lab 10/20/2019 1802  WBC 14.9*  HGB 11.4*  HCT 37.5  PLT 240  MCV 97.4  MCH 29.6  MCHC 30.4  RDW 14.5  LYMPHSABS 0.7  MONOABS 0.5  EOSABS 0.0  BASOSABS 0.0    Chemistries  Recent Labs  Lab 10/31/2019 1802  NA 141  K 3.5  CL 99  CO2 29  GLUCOSE 109*  BUN 64*  CREATININE 3.63*  CALCIUM 8.8*  AST 125*  ALT 69*  ALKPHOS 82  BILITOT 0.6   ------------------------------------------------------------------------------------------------------------------ Recent Labs    10/24/2019 1802  TRIG 175*    No results found for: HGBA1C ------------------------------------------------------------------------------------------------------------------ No results for input(s): TSH, T4TOTAL, T3FREE, THYROIDAB in the last 72 hours.  Invalid input(s): FREET3 ------------------------------------------------------------------------------------------------------------------ Recent Labs    10/31/2019 1846 10/10/19 0700  FERRITIN 246 249    Coagulation profile No results for input(s): INR, PROTIME in the last 168 hours.  Recent Labs    10/07/2019 1802 10/10/19 0700  DDIMER 1.91* 2.66*    Cardiac Enzymes No results for input(s): CKMB, TROPONINI, MYOGLOBIN in the last 168 hours.  Invalid input(s): CK ------------------------------------------------------------------------------------------------------------------ No results found for: BNP  Micro Results Recent Results (from the past 240 hour(s))  SARS  Coronavirus 2 by RT PCR (hospital order, performed in [facility] hospital lab) Nasopharyngeal Nasopharyngeal Swab     Status: Abnormal   Collection Time: 10/30/2019  5:40 PM   Specimen: Nasopharyngeal Swab  Result Value Ref Range Status   SARS Coronavirus 2 POSITIVE (A) NEGATIVE Final    Comment: RESULT CALLED TO, READ BACK BY AND VERIFIED WITH: RN RUGGIERO MEGAN AT 2035 BY [place] ON 10/07/2019 (NOTE) SARS-CoV-2 target nucleic acids are DETECTED  SARS-CoV-2 RNA is generally detectable in upper respiratory specimens  during the acute phase of infection.  Positive results are indicative  of the presence of the identified virus, but do not rule out bacterial infection or co-infection with other pathogens not detected by the test.  Clinical correlation with patient history and  other diagnostic information is necessary to determine patient infection status.  The expected result is negative.  Fact Sheet for Patients:   StrictlyIdeas.no   Fact Sheet for Healthcare Providers:   BankingDealers.co.za    This test is not yet approved or cleared by the [place] FDA and  has been authorized for detection and/or diagnosis of SARS-CoV-2 by FDA under an Emergency Use Authorization (EUA).  This EUA will remain in  effect (meaning this test can be used) for the duration of  the COVID-19 declaration under Section 564(b)(1) of the Act, 21 U.S.C. section 360-bbb-3(b)(1), unless the authorization is terminated or revoked sooner.  Performed at [facility], [place] [address], [place], [place] [address]   Blood Culture (routine x 2)     Status: None (Preliminary result)   Collection Time: 10/27/2019  5:40 PM   Specimen: BLOOD  Result Value Ref Range Status   Specimen Description BLOOD LEFT ANTECUBITAL  Final   Special Requests   Final    BOTTLES DRAWN AEROBIC AND ANAEROBIC Blood Culture adequate volume   Culture   Final    NO GROWTH < 24  HOURS Performed at [facility], [place] [address] [place], [place] [address]    Report Status PENDING  Incomplete  Blood Culture (routine x 2)     Status: None (Preliminary result)   Collection Time: 10/13/2019  6:49 PM   Specimen: BLOOD  Result Value Ref Range Status   Specimen Description BLOOD RIGHT ANTECUBITAL  Final   Special Requests   Final    BOTTLES DRAWN AEROBIC AND ANAEROBIC Blood Culture adequate volume   Culture   Final    NO GROWTH < 24 HOURS Performed at [facility], [place] [address], [place], [place] [address]    Report Status PENDING  Incomplete    Radiology Reports Portable chest  1 View  Result Date: 10/10/2019 CLINICAL DATA:  COVID positive EXAM: PORTABLE CHEST 1 VIEW COMPARISON:  None. FINDINGS: The heart size and mediastinal contours are within normal limits. Aortic knob calcifications are seen. There is slight hyperinflation of the upper lung zones. Mildly increased interstitial markings are seen at the left lung base. No acute osseous abnormality. The visualized skeletal structures are unremarkable. IMPRESSION: No active disease. Findings suggestive of COPD with chronic interstitial changes at the left lung base. Electronically Signed   By: Prudencio Pair M.D.   On: 10/10/2019 00:50   DG Chest Port 1 View  Result Date: 10/18/2019 CLINICAL DATA:  Wet cough, crackles, hypoxia EXAM: PORTABLE CHEST 1 VIEW COMPARISON:  06/05/2015 FINDINGS: Mild cardiomegaly. Atherosclerotic calcification of the aortic knob. Hyperexpanded lungs with coarsened interstitial markings bilaterally. No definite airspace consolidation. No pleural effusion or pneumothorax. IMPRESSION: Chronic findings of COPD without evidence of acute superimposed cardiopulmonary process. Electronically Signed   By: Davina Poke D.O.   On: 10/10/2019 18:00

## 2019-10-10 NOTE — ED Notes (Signed)
Dinner ordered 

## 2019-10-10 NOTE — ED Notes (Signed)
Pt spo2 dropped to 87-91% on 2 lpm o2 via [place] while pt sleeping, o2 increased to 3 lpm and spo2 up to 96%, will continue to monitor.

## 2019-10-11 ENCOUNTER — Inpatient Hospital Stay (HOSPITAL_COMMUNITY): Payer: Medicare Other

## 2019-10-11 ENCOUNTER — Encounter (HOSPITAL_COMMUNITY): Payer: Self-pay | Admitting: Internal Medicine

## 2019-10-11 DIAGNOSIS — J1282 Pneumonia due to coronavirus disease 2019: Secondary | ICD-10-CM

## 2019-10-11 LAB — COMPREHENSIVE METABOLIC PANEL
ALT: 69 U/L — ABNORMAL HIGH (ref 0–44)
AST: 117 U/L — ABNORMAL HIGH (ref 15–41)
Albumin: 2.4 g/dL — ABNORMAL LOW (ref 3.5–5.0)
Alkaline Phosphatase: 73 U/L (ref 38–126)
Anion gap: 16 — ABNORMAL HIGH (ref 5–15)
BUN: 75 mg/dL — ABNORMAL HIGH (ref 8–23)
CO2: 28 mmol/L (ref 22–32)
Calcium: 8.9 mg/dL (ref 8.9–10.3)
Chloride: 102 mmol/L (ref 98–111)
Creatinine, Ser: 2.83 mg/dL — ABNORMAL HIGH (ref 0.44–1.00)
GFR calc Af Amer: 17 mL/min — ABNORMAL LOW (ref >60)
GFR calc non Af Amer: 14 mL/min — ABNORMAL LOW (ref >60)
Glucose, Bld: 128 mg/dL — ABNORMAL HIGH (ref 70–99)
Potassium: 3.6 mmol/L (ref 3.5–5.1)
Sodium: 146 mmol/L — ABNORMAL HIGH (ref 135–145)
Total Bilirubin: 0.7 mg/dL (ref 0.3–1.2)
Total Protein: 6.1 g/dL — ABNORMAL LOW (ref 6.5–8.1)

## 2019-10-11 LAB — CBC WITH DIFFERENTIAL/PLATELET
Abs Immature Granulocytes: 0.12 10*3/uL — ABNORMAL HIGH (ref 0.00–0.07)
Basophils Absolute: 0 10*3/uL (ref 0.0–0.1)
Basophils Relative: 0 %
Eosinophils Absolute: 0 10*3/uL (ref 0.0–0.5)
Eosinophils Relative: 0 %
HCT: 36.3 % (ref 36.0–46.0)
Hemoglobin: 11.2 g/dL — ABNORMAL LOW (ref 12.0–15.0)
Immature Granulocytes: 1 %
Lymphocytes Relative: 4 %
Lymphs Abs: 0.7 10*3/uL (ref 0.7–4.0)
MCH: 29.3 pg (ref 26.0–34.0)
MCHC: 30.9 g/dL (ref 30.0–36.0)
MCV: 95 fL (ref 80.0–100.0)
Monocytes Absolute: 0.4 10*3/uL (ref 0.1–1.0)
Monocytes Relative: 3 %
Neutro Abs: 13.5 10*3/uL — ABNORMAL HIGH (ref 1.7–7.7)
Neutrophils Relative %: 92 %
Platelets: 251 10*3/uL (ref 150–400)
RBC: 3.82 MIL/uL — ABNORMAL LOW (ref 3.87–5.11)
RDW: 14.6 % (ref 11.5–15.5)
WBC: 14.6 10*3/uL — ABNORMAL HIGH (ref 4.0–10.5)
nRBC: 0.2 % (ref 0.0–0.2)

## 2019-10-11 LAB — BRAIN NATRIURETIC PEPTIDE: B Natriuretic Peptide: 416.2 pg/mL — ABNORMAL HIGH (ref 0.0–100.0)

## 2019-10-11 LAB — CBG MONITORING, ED
Glucose-Capillary: 118 mg/dL — ABNORMAL HIGH (ref 70–99)
Glucose-Capillary: 113 mg/dL — ABNORMAL HIGH (ref 70–99)

## 2019-10-11 LAB — C-REACTIVE PROTEIN: CRP: 9.7 mg/dL — ABNORMAL HIGH (ref <1.0)

## 2019-10-11 LAB — GLUCOSE, CAPILLARY
Glucose-Capillary: 127 mg/dL — ABNORMAL HIGH (ref 70–99)
Glucose-Capillary: 100 mg/dL — ABNORMAL HIGH (ref 70–99)

## 2019-10-11 LAB — D-DIMER, QUANTITATIVE: D-Dimer, Quant: 3.57 ug/mL-FEU — ABNORMAL HIGH (ref 0.00–0.50)

## 2019-10-11 LAB — FERRITIN: Ferritin: 210 ng/mL (ref 11–307)

## 2019-10-11 MED ORDER — ENSURE ENLIVE PO LIQD
237.0000 mL | Freq: Two times a day (BID) | ORAL | Status: DC
  Administered 2019-10-11 – 2019-10-13 (×4): 237 mL via ORAL

## 2019-10-11 MED ORDER — METOPROLOL TARTRATE 5 MG/5ML IV SOLN
5.0000 mg | INTRAVENOUS | Status: DC | PRN
  Administered 2019-10-15: 5 mg via INTRAVENOUS
  Filled 2019-10-11: qty 5

## 2019-10-11 NOTE — ED Notes (Signed)
Lunch Tray Ordered @ 1048. °

## 2019-10-11 NOTE — Plan of Care (Signed)
  Problem: Nutrition: Goal: Adequate nutrition will be maintained Outcome: Not Progressing   Problem: Coping: Goal: Level of anxiety will decrease Outcome: Progressing   Problem: Respiratory: Goal: Will maintain a patent airway Outcome: Progressing

## 2019-10-11 NOTE — Progress Notes (Signed)
   10/11/19 1609  Assess: MEWS Score  Temp 98.2 F (36.8 C)  BP 140/88  Pulse Rate (!) 101  Resp (!) 21  SpO2 93 %  O2 Device Nasal Cannula  O2 Flow Rate (L/min) 3 L/min  Assess: MEWS Score  MEWS Temp 0  MEWS Systolic 0  MEWS Pulse 1  MEWS RR 1  MEWS LOC 0  MEWS Score 2  MEWS Score Color Yellow  Assess: if the MEWS score is Yellow or Red  Were vital signs taken at a resting state? Yes  Focused Assessment No change from prior assessment  Early Detection of Sepsis Score *See Row Information* Low  MEWS guidelines implemented *See Row Information* Yes  Treat  MEWS Interventions Escalated (See documentation below)  Pain Scale 0-10  Pain Score 0  Take Vital Signs  Increase Vital Sign Frequency  Yellow: Q 2hr X 2 then Q 4hr X 2, if remains yellow, continue Q 4hrs  Escalate  MEWS: Escalate Yellow: discuss with charge nurse/RN and consider discussing with provider and RRT  Notify: Charge Nurse/RN  Name of Charge Nurse/RN Notified Patrici Ranks, RN  Date Charge Nurse/RN Notified 10/11/19  Time Charge Nurse/RN Notified 1613  Notify: Provider  Provider Name/Title Dr. Sloan Leiter  Date Provider Notified 10/11/19  Time Provider Notified 1614  Notification Type Page  Notification Reason Other (Comment) (patient arrived from ED)  Notify: Rapid Response  Name of Rapid Response RN Notified  (n/a)  Document  Progress note created (see row info) Yes

## 2019-10-11 NOTE — Progress Notes (Signed)
Floor coverage  Patient with a history of chronic diastolic CHF, A. fib, COPD, CKD stage IV, hypertension, hyperlipidemia admitted 8/9 for acute hypoxemic respiratory failure. Chest x-ray was unremarkable but patient's respiratory distress/hypoxia were felt to be due to Covid pneumonia. SARS-CoV-2 PCR test positive. CRP 16.8 >13.2. WBC count 14.9>12.0. Lactic acid 1.0. D-dimer 1.91>2.66. Procalcitonin 0.31. Patient was initially given antibiotics for coverage of bacterial pneumonia but they were discontinued. Currently on steroid and remdesivir.  Paged by nursing staff due to concern for tachypnea and increased oxygen requirement. Patient was on 2 L supplemental oxygen but desatted to 86-87%. She was placed on 4 L supplemental oxygen.  Patient was seen and examined at bedside.  Resting comfortably and has no complaints.  Satting 95% on 4 L supplemental oxygen.  Slightly tachypneic but respiratory rate <25.  Heart rate 90-100.  Upon auscultation of the lungs, breath sounds slightly diminished at the bases with rales.  No lower extremity edema.  Plan -Continue steroid and remdesivir -Stat ABG, BNP -Stat chest x-ray -Continue supplemental oxygen to keep oxygen saturation above 90%

## 2019-10-11 NOTE — Progress Notes (Addendum)
PROGRESS NOTE                                                                                                                                                                                                             Patient Demographics:    Brenda [name], is a 86 y.o. female, DOB - 05/30/1931, TLX:[id number]  Outpatient Primary MD for the patient is Charolette Forward, MD   Admit date - 10/12/2019   LOS - 1  No chief complaint on file.      Brief Narrative: Patient is a 86 y.o. female with PMHx of chronic diastolic heart failure, COPD, HTN, stage IV CKD, HLD-who presented with a 7-day history of weakness, worsening shortness of breath-she was found to have acute hypoxic respiratory failure in the setting of COVID-19 infection (unvaccinated)  Significant Events: 8/7>> Admit to [facility] for for hypoxia secondary to COVID-19 pneumonia  Significant studies: 8/8>>Chest x-ray: Chronic findings of COPD without evidence of acute superimposed cardiopulmonary process. 8/9>> chest x-ray: No acute disease-findings suggestive of COPD with chronic interstitial changes at left lung base 8/10>> chest x-ray: Increasing left lower airspace disease compatible with pneumonia  COVID-19 medications: Steroids: 8/8>> Remdesivir: 8/8>>  Antibiotics: Rocephin: 8/8>>8/9 Zithromax: 8/9>>8/9  Microbiology data: 8/8 >>blood culture: No growth  Procedures: None  Consults: None  DVT prophylaxis: apixaban (ELIQUIS) tablet 2.5 mg Start: 10/10/19 0015 apixaban (ELIQUIS) tablet 2.5 mg     Subjective:   Feels better-she was on 3 L of oxygen when I walked into the room-she was titrated down to just 1 L.  Continues to have some cough.  Appears very frail and debilitated.   Assessment  & Plan :   Acute Hypoxic Resp Failure due to Covid 19 Viral pneumonia: Slowly improving-down to just 1 L of oxygen.  Continue steroids/remdesivir.  Await evaluation  by rehab services-continue with attempts to titrate off oxygen today.    Fever: afebrile O2 requirements:  SpO2: 92 % O2 Flow Rate (L/min): 3 L/min   COVID-19 Labs: Recent Labs    10/23/2019 1802 10/21/2019 1846 10/10/19 0700 10/10/19 1144 10/11/19 0813  DDIMER 1.91*  --  2.66*  --  3.57*  FERRITIN  --  246 249  --  210  LDH 237*  --   --   --   --   CRP  --  16.8*  --  13.2* 9.7*       Component Value Date/Time   BNP 416.2 (H) 10/11/2019 0813    Recent Labs  Lab 10/15/2019 1802  PROCALCITON 0.31    Lab Results  Component Value Date   SARSCOV2NAA POSITIVE (A) 10/13/2019    Prone/Incentive Spirometry: encouraged  incentive spirometry use 3-4/hour.  Transaminitis: Likely secondary to COVID-19-LFTs downtrending-follow.  Cautiously continue with statin for now.  Hypernatremia: Mild-encourage oral intake-volume status stable-do not think patient requires IVF.  Follow electrolytes.  COPD: Not in exacerbation-continue bronchodilators.  HTN: BP stable-not on any antihypertensives at home-watch closely-no indication to start medications at this point.    Chronic atrial fibrillation: Rate controlled with amiodarone-remains on Eliquis  Chronic diastolic heart failure: Volume status remains stable-hold furosemide.  CKD stage IV: Apparently follows with Dr. Colbert Ewing seems to be downtrending-not sure what her baseline is at (but family confirms CKD stage IV).  BUN remains elevated but suspect this is from steroids.  Follow electrolytes.  Avoid nephrotoxic agents.  Debility/deconditioning-appears to be very frail/weak-likely secondary to acute illness from COVID-19-also has significant underlying medical comorbidities-have ordered PT/OT.  Palliative care discussion: 86 year old elderly patient with CKD stage IV, chronic diastolic heart failure, COPD-admitted with COVID-19 pneumonia with mild to moderate hypoxemia.  She appears incredibly frail and debilitated.  Brief  discussion with patient this morning-for now she wishes to be a full code-have asked her to start discussion with family regarding advanced directives.  She is not a great candidate for aggressive care if she were to have severe disease from COVID-19.  Have spoken with the patient's son-he is aware of the potential to decline-family discussion in progress regarding goals of care/advanced directives if she were to decline significantly.  ABG:    Component Value Date/Time   PHART 7.377 03/17/2013 0138   PCO2ART 50.6 (H) 03/17/2013 0138   PO2ART 89.0 03/17/2013 0138   HCO3 29.9 (H) 03/17/2013 0138   TCO2 31 03/17/2013 0138   ACIDBASEDEF 0.7 03/17/2013 0008   O2SAT 97.0 03/17/2013 0138    Vent Settings: N/A  Condition - Guarded  Family Communication  :  Son-Kenneth [phone]-updated on 8/10  Code Status :  Full Code  Diet :  Diet Order            Diet regular Room service appropriate? Yes with Assist; Fluid consistency: Thin  Diet effective now                  Disposition Plan  :   Status is: Inpatient  Remains inpatient appropriate because:Inpatient level of care appropriate due to severity of illness  Dispo: The patient is from: Home              Anticipated d/c is to: TBD              Anticipated d/c date is: > 3 days              Patient currently is not medically stable to d/c.   Barriers to discharge: Hypoxia requiring O2 supplementation/complete 5 days of IV Remdesivir  Antimicorbials  :    Anti-infectives (From admission, onward)   Start     Dose/Rate Route Frequency Ordered Stop   10/11/19 1000  remdesivir 100 mg in sodium chloride 0.9 % 100 mL IVPB     Discontinue    "Followed by" Linked Group Details   100 mg 200 mL/hr over 30 Minutes Intravenous Daily 10/10/19 0011 10/15/19 0959   10/10/19 2200  cefTRIAXone (ROCEPHIN)  1 g in sodium chloride 0.9 % 100 mL IVPB  Status:  Discontinued        1 g 200 mL/hr over 30 Minutes Intravenous Every 24 hours  10/10/19 0011 10/10/19 1120   10/10/19 1000  azithromycin (ZITHROMAX) tablet 500 mg  Status:  Discontinued        500 mg Oral Daily 10/10/19 0011 10/10/19 1120   10/10/19 0045  remdesivir 200 mg in sodium chloride 0.9% 250 mL IVPB       "Followed by" Linked Group Details   200 mg 580 mL/hr over 30 Minutes Intravenous Once 10/10/19 0011 10/10/19 0152   10/10/19 0030  cefTRIAXone (ROCEPHIN) 1 g in sodium chloride 0.9 % 100 mL IVPB        1 g 200 mL/hr over 30 Minutes Intravenous  Once 10/10/19 0028 10/10/19 0141      Inpatient Medications  Scheduled Meds: . acetaminophen  1,000 mg Oral QHS  . albuterol  2 puff Inhalation Q6H  . allopurinol  100 mg Oral Daily  . amiodarone  200 mg Oral Daily  . apixaban  2.5 mg Oral BID  . atorvastatin  20 mg Oral Daily  . benzonatate  200 mg Oral TID  . calcitRIOL  0.25 mcg Oral Q M,W,F  . carvedilol  6.25 mg Oral BID WC  . cholecalciferol  2,000 Units Oral Daily  . diphenhydrAMINE  25 mg Oral QHS  . fluticasone furoate-vilanterol  1 puff Inhalation Daily  . insulin aspart  0-15 Units Subcutaneous TID WC  . levothyroxine  75 mcg Oral Q0600  . methylPREDNISolone (SOLU-MEDROL) injection  50 mg Intravenous Q12H  . pantoprazole  40 mg Oral Daily  . rOPINIRole  0.5 mg Oral QHS  . senna  1 tablet Oral BID  . sodium chloride flush  3 mL Intravenous Q12H  . vitamin B-12  1,000 mcg Oral Daily   Continuous Infusions: . sodium chloride    . remdesivir 100 mg in NS 100 mL 100 mg (10/11/19 1042)   PRN Meds:.sodium chloride, chlorpheniramine-HYDROcodone, sodium chloride flush   Time Spent in minutes  25    See all Orders from today for further details   Oren Binet M.D on 10/11/2019 at 12:11 PM  To page go to [email or web address] - use universal password  [facility] -  Office  [phone]    Objective:   Vitals:   10/11/19 0032 10/11/19 0229 10/11/19 0330 10/11/19 0622  BP: 125/68 122/88 130/88 (!) 141/88  Pulse: 92 98 (!) 108 98    Resp: (!) 22 (!) 29 (!) 22 18  Temp:   97.9 F (36.6 C) (!) 97.4 F (36.3 C)  TempSrc:   Axillary Axillary  SpO2: 95% 95% 96% 92%  Weight:      Height:        Wt Readings from Last 3 Encounters:  10/02/2019 52.2 kg  06/05/15 56.2 kg  03/24/13 58.5 kg     Intake/Output Summary (Last 24 hours) at 10/11/2019 1211 Last data filed at 10/10/2019 2151 Gross per 24 hour  Intake 3 ml  Output --  Net 3 ml     Physical Exam Gen Exam:Alert awake-not in any distress.  Looks very frail and debilitated. HEENT:atraumatic, normocephalic Chest: B/L clear to auscultation anteriorly CVS:S1S2 regular Abdomen:soft non tender, non distended Extremities:no edema Neurology: Non focal Skin: no rash   Data Review:    CBC Recent Labs  Lab 10/22/2019 1802 10/10/19 1144 10/11/19 0813  WBC 14.9* 12.0* 14.6*  HGB 11.4* 11.1* 11.2*  HCT 37.5 36.8 36.3  PLT 240 238 251  MCV 97.4 97.4 95.0  MCH 29.6 29.4 29.3  MCHC 30.4 30.2 30.9  RDW 14.5 14.6 14.6  LYMPHSABS 0.7  --  0.7  MONOABS 0.5  --  0.4  EOSABS 0.0  --  0.0  BASOSABS 0.0  --  0.0    Chemistries  Recent Labs  Lab 10/20/2019 1802 10/10/19 1144 10/11/19 0813  NA 141 142 146*  K 3.5 3.7 3.6  CL 99 99 102  CO2 29 29 28   GLUCOSE 109* 119* 128*  BUN 64* 61* 75*  CREATININE 3.63* 3.02* 2.83*  CALCIUM 8.8* 8.4* 8.9  AST 125* 127* 117*  ALT 69* 71* 69*  ALKPHOS 82 77 73  BILITOT 0.6 0.6 0.7   ------------------------------------------------------------------------------------------------------------------ Recent Labs    10/28/2019 1802  TRIG 175*    No results found for: HGBA1C ------------------------------------------------------------------------------------------------------------------ No results for input(s): TSH, T4TOTAL, T3FREE, THYROIDAB in the last 72 hours.  Invalid input(s): FREET3 ------------------------------------------------------------------------------------------------------------------ Recent Labs     10/10/19 0700 10/11/19 0813  FERRITIN 249 210    Coagulation profile No results for input(s): INR, PROTIME in the last 168 hours.  Recent Labs    10/10/19 0700 10/11/19 0813  DDIMER 2.66* 3.57*    Cardiac Enzymes No results for input(s): CKMB, TROPONINI, MYOGLOBIN in the last 168 hours.  Invalid input(s): CK ------------------------------------------------------------------------------------------------------------------    Component Value Date/Time   BNP 416.2 (H) 10/11/2019 0813    Micro Results Recent Results (from the past 240 hour(s))  SARS Coronavirus 2 by RT PCR (hospital order, performed in [facility] hospital lab) Nasopharyngeal Nasopharyngeal Swab     Status: Abnormal   Collection Time: 10/28/2019  5:40 PM   Specimen: Nasopharyngeal Swab  Result Value Ref Range Status   SARS Coronavirus 2 POSITIVE (A) NEGATIVE Final    Comment: RESULT CALLED TO, READ BACK BY AND VERIFIED WITH: RN RUGGIERO MEGAN AT 2035 BY [place] ON 10/10/2019 (NOTE) SARS-CoV-2 target nucleic acids are DETECTED  SARS-CoV-2 RNA is generally detectable in upper respiratory specimens  during the acute phase of infection.  Positive results are indicative  of the presence of the identified virus, but do not rule out bacterial infection or co-infection with other pathogens not detected by the test.  Clinical correlation with patient history and  other diagnostic information is necessary to determine patient infection status.  The expected result is negative.  Fact Sheet for Patients:   StrictlyIdeas.no   Fact Sheet for Healthcare Providers:   BankingDealers.co.za    This test is not yet approved or cleared by the [place] FDA and  has been authorized for detection and/or diagnosis of SARS-CoV-2 by FDA under an Emergency Use Authorization (EUA).  This EUA will remain in  effect (meaning this test can be used) for the duration of  the  COVID-19 declaration under Section 564(b)(1) of the Act, 21 U.S.C. section 360-bbb-3(b)(1), unless the authorization is terminated or revoked sooner.  Performed at [facility], [place] [address], [place], [place] [address]   Blood Culture (routine x 2)     Status: None (Preliminary result)   Collection Time: 10/12/2019  5:40 PM   Specimen: BLOOD  Result Value Ref Range Status   Specimen Description BLOOD LEFT ANTECUBITAL  Final   Special Requests   Final    BOTTLES DRAWN AEROBIC AND ANAEROBIC Blood Culture adequate volume   Culture   Final    NO  GROWTH 2 DAYS Performed at [facility], [place] [address], [place], [place] [address]    Report Status PENDING  Incomplete  Blood Culture (routine x 2)     Status: None (Preliminary result)   Collection Time: 11/01/2019  6:49 PM   Specimen: BLOOD  Result Value Ref Range Status   Specimen Description BLOOD RIGHT ANTECUBITAL  Final   Special Requests   Final    BOTTLES DRAWN AEROBIC AND ANAEROBIC Blood Culture adequate volume   Culture   Final    NO GROWTH 2 DAYS Performed at [facility], [place] [address] [place], [place] [address]    Report Status PENDING  Incomplete    Radiology Reports DG CHEST PORT 1 VIEW  Result Date: 10/11/2019 CLINICAL DATA:  Hypoxic respiratory failure.  COVID 19 pneumonia. EXAM: PORTABLE CHEST 1 VIEW COMPARISON:  One-view chest x-ray 10/07/2019 FINDINGS: Heart is enlarged. Atherosclerotic changes are noted at the aortic arch. Changes of COPD are again noted. Increasing left lower lobe airspace disease is now present. Right lung bases clear. IMPRESSION: 1. Increasing left lower lobe airspace disease compatible with pneumonia. 2. Stable cardiomegaly. 3. Changes of COPD. Electronically Signed   By: San Morelle M.D.   On: 10/11/2019 04:31   Portable chest 1 View  Result Date: 10/10/2019 CLINICAL DATA:  COVID positive EXAM: PORTABLE CHEST 1 VIEW COMPARISON:  None. FINDINGS: The heart size and  mediastinal contours are within normal limits. Aortic knob calcifications are seen. There is slight hyperinflation of the upper lung zones. Mildly increased interstitial markings are seen at the left lung base. No acute osseous abnormality. The visualized skeletal structures are unremarkable. IMPRESSION: No active disease. Findings suggestive of COPD with chronic interstitial changes at the left lung base. Electronically Signed   By: Prudencio Pair M.D.   On: 10/10/2019 00:50   DG Chest Port 1 View  Result Date: 10/17/2019 CLINICAL DATA:  Wet cough, crackles, hypoxia EXAM: PORTABLE CHEST 1 VIEW COMPARISON:  06/05/2015 FINDINGS: Mild cardiomegaly. Atherosclerotic calcification of the aortic knob. Hyperexpanded lungs with coarsened interstitial markings bilaterally. No definite airspace consolidation. No pleural effusion or pneumothorax. IMPRESSION: Chronic findings of COPD without evidence of acute superimposed cardiopulmonary process. Electronically Signed   By: Davina Poke D.O.   On: 10/15/2019 18:00

## 2019-10-11 NOTE — Progress Notes (Signed)
Patient trasfered from ED to 5W17 via stretcher; alert and oriented x 3; no complaints of pain; IV saline locked in LFA; skin intact; MASD on buttocks. Started MEWS protocol due to MEWS yellow. Orient patient to room and unit; gave patient care guide; instructed how to use the call bell and  fall risk precautions. Will continue to monitor the patient.

## 2019-10-11 NOTE — ED Notes (Signed)
PT Desat to 86 while sleeping with RR in 30's. PT placed on 6L. PT O2 sat came up to 94 with RR  30. MD Rathore paged. Nurse was able to titrate back down to 4 L. Sat 93 with RR in 20's.

## 2019-10-11 NOTE — Progress Notes (Addendum)
RT unsuccessful X2 with ABG.

## 2019-10-11 NOTE — ED Notes (Signed)
Patient is resting on hospital bed. Am care products given to patient with fresh pitcher of water.

## 2019-10-11 NOTE — ED Notes (Addendum)
MD Rathore made aware increase 02 while pt is sleeping. Pt currently on 4 L. Nurse attempted to titrate 3L but pt desat to 89-90%. Pt maintaining low to mid. 90's on 4 L while sleeping. MD Marlowe Sax will round on PT in future. MD Rathore also made aware of afib RVR with average HR upper 90's-120's.

## 2019-10-12 ENCOUNTER — Encounter (HOSPITAL_COMMUNITY): Payer: Self-pay | Admitting: Internal Medicine

## 2019-10-12 LAB — COMPREHENSIVE METABOLIC PANEL
ALT: 62 U/L — ABNORMAL HIGH (ref 0–44)
AST: 104 U/L — ABNORMAL HIGH (ref 15–41)
Albumin: 2.5 g/dL — ABNORMAL LOW (ref 3.5–5.0)
Alkaline Phosphatase: 76 U/L (ref 38–126)
Anion gap: 14 (ref 5–15)
BUN: 89 mg/dL — ABNORMAL HIGH (ref 8–23)
CO2: 29 mmol/L (ref 22–32)
Calcium: 8.9 mg/dL (ref 8.9–10.3)
Chloride: 103 mmol/L (ref 98–111)
Creatinine, Ser: 3.1 mg/dL — ABNORMAL HIGH (ref 0.44–1.00)
GFR calc Af Amer: 15 mL/min — ABNORMAL LOW (ref >60)
GFR calc non Af Amer: 13 mL/min — ABNORMAL LOW (ref >60)
Glucose, Bld: 150 mg/dL — ABNORMAL HIGH (ref 70–99)
Potassium: 3.5 mmol/L (ref 3.5–5.1)
Sodium: 146 mmol/L — ABNORMAL HIGH (ref 135–145)
Total Bilirubin: 0.5 mg/dL (ref 0.3–1.2)
Total Protein: 6 g/dL — ABNORMAL LOW (ref 6.5–8.1)

## 2019-10-12 LAB — GLUCOSE, CAPILLARY
Glucose-Capillary: 158 mg/dL — ABNORMAL HIGH (ref 70–99)
Glucose-Capillary: 152 mg/dL — ABNORMAL HIGH (ref 70–99)
Glucose-Capillary: 109 mg/dL — ABNORMAL HIGH (ref 70–99)
Glucose-Capillary: 147 mg/dL — ABNORMAL HIGH (ref 70–99)

## 2019-10-12 LAB — D-DIMER, QUANTITATIVE: D-Dimer, Quant: 4.18 ug/mL-FEU — ABNORMAL HIGH (ref 0.00–0.50)

## 2019-10-12 LAB — CBC WITH DIFFERENTIAL/PLATELET
Abs Immature Granulocytes: 0.14 10*3/uL — ABNORMAL HIGH (ref 0.00–0.07)
Basophils Absolute: 0 10*3/uL (ref 0.0–0.1)
Basophils Relative: 0 %
Eosinophils Absolute: 0 10*3/uL (ref 0.0–0.5)
Eosinophils Relative: 0 %
HCT: 35.8 % — ABNORMAL LOW (ref 36.0–46.0)
Hemoglobin: 11.4 g/dL — ABNORMAL LOW (ref 12.0–15.0)
Immature Granulocytes: 1 %
Lymphocytes Relative: 4 %
Lymphs Abs: 0.5 10*3/uL — ABNORMAL LOW (ref 0.7–4.0)
MCH: 29.8 pg (ref 26.0–34.0)
MCHC: 31.8 g/dL (ref 30.0–36.0)
MCV: 93.7 fL (ref 80.0–100.0)
Monocytes Absolute: 0.3 10*3/uL (ref 0.1–1.0)
Monocytes Relative: 2 %
Neutro Abs: 14.3 10*3/uL — ABNORMAL HIGH (ref 1.7–7.7)
Neutrophils Relative %: 93 %
Platelets: 257 10*3/uL (ref 150–400)
RBC: 3.82 MIL/uL — ABNORMAL LOW (ref 3.87–5.11)
RDW: 14.6 % (ref 11.5–15.5)
WBC: 15.3 10*3/uL — ABNORMAL HIGH (ref 4.0–10.5)
nRBC: 0.3 % — ABNORMAL HIGH (ref 0.0–0.2)

## 2019-10-12 LAB — C-REACTIVE PROTEIN: CRP: 7.8 mg/dL — ABNORMAL HIGH (ref <1.0)

## 2019-10-12 LAB — FERRITIN: Ferritin: 189 ng/mL (ref 11–307)

## 2019-10-12 MED ORDER — ALBUTEROL SULFATE HFA 108 (90 BASE) MCG/ACT IN AERS
1.0000 | INHALATION_SPRAY | Freq: Four times a day (QID) | RESPIRATORY_TRACT | Status: DC | PRN
  Administered 2019-10-13 – 2019-10-14 (×2): 2 via RESPIRATORY_TRACT

## 2019-10-12 MED ORDER — METHYLPREDNISOLONE SODIUM SUCC 40 MG IJ SOLR
40.0000 mg | Freq: Two times a day (BID) | INTRAMUSCULAR | Status: DC
  Administered 2019-10-12 – 2019-10-14 (×5): 40 mg via INTRAVENOUS
  Filled 2019-10-12 (×5): qty 1

## 2019-10-12 MED ORDER — SODIUM CHLORIDE 0.9 % IV SOLN: INTRAVENOUS | Status: DC

## 2019-10-12 NOTE — Progress Notes (Signed)
Brenda [name]  TGY:[id number] DOB: [birth year] DOA: 10/11/2019 PCP: Charolette Forward, MD    Brief Narrative:  86 year old with a history of chronic diastolic CHF, COPD, HTN, stage IV CKD, and HLD who presented with a 7-day history of worsening weakness and shortness of breath and was found to be acutely hypoxic with a confirmed Covid pneumonia.  Significant Events:  8/7 admit via [place] with hypoxia  Date of Positive COVID Test:  8/7  Vaccination Status: Unvaccinated  COVID-19 specific Treatment: Steroid 8/8 > Remdesivir 8/8 >  Antimicrobials:  Rocephin 8/8 > 8/9 Zithromax 8/9  DVT prophylaxis: Eliquis  Subjective: Resting comfortably in bed.  Denies any new complaints.  States she feels weak in general. Denies current shortness of breath.  Requiring only minimal oxygen support at present.  Assessment & Plan:  Acute hypoxic respiratory failure -COVID Pneumonia Slowly making progress with ongoing weaning of oxygen -remains on steroid and remdesivir  Recent Labs  Lab 10/13/2019 1802 10/29/2019 1846 10/10/19 0700 10/10/19 1144 10/11/19 0813 10/12/19 0109  DDIMER 1.91*  --  2.66*  --  3.57* 4.18*  FERRITIN  --  246 249  --  210 189  CRP  --  16.8*  --  13.2* 9.7* 7.8*  ALT 69*  --   --  71* 69* 62*  PROCALCITON 0.31  --   --   --   --   --    Transaminitis Felt to be due to Covid and treatment of same  Hypernatremia Likely simple dehydration -gently hydrate and follow  COPD Continue usual bronchodilators with no evidence of an acute exacerbation  HTN Blood pressure currently well controlled  Chronic atrial fibrillation Continue amiodarone and Eliquis -rate controlled  Chronic diastolic CHF No evidence of volume overload at present and in fact patient likely slightly dehydrated  CKD stage IV Followed by Dr. Royce Macadamia  Debility/deconditioning  Palliative care discussion Goals of care were discussed with patient and she confirmed she wishes to remain a full  code    Code Status: FULL CODE Family Communication:  Status is: Inpatient  Remains inpatient appropriate because:Inpatient level of care appropriate due to severity of illness   Dispo: The patient is from: Home              Anticipated d/c is to: Unclear              Anticipated d/c date is: 2 days              Patient currently is not medically stable to d/c.    Consultants:  none  Objective: Blood pressure 129/82, pulse 95, temperature 97.9 F (36.6 C), resp. rate (!) 21, height 5\' 4"  (1.626 m), weight 48 kg, SpO2 94 %.  Intake/Output Summary (Last 24 hours) at 10/12/2019 1007 Last data filed at 10/12/2019 0900 Gross per 24 hour  Intake 300 ml  Output --  Net 300 ml   Filed Weights   10/20/2019 1700 10/11/19 1553  Weight: 52.2 kg 48 kg    Examination: General: No acute respiratory distress Lungs: Scattered fine crackles diffusely bilaterally Cardiovascular: Irregularly irregular without murmur or rub Abdomen: Nontender, nondistended, soft, bowel sounds positive, no rebound, no ascites, no appreciable mass Extremities: No significant cyanosis, clubbing, or edema bilateral lower extremities  CBC: Recent Labs  Lab 10/28/2019 1802 10/26/2019 1802 10/10/19 1144 10/11/19 0813 10/12/19 0109  WBC 14.9*   < > 12.0* 14.6* 15.3*  NEUTROABS 13.6*  --   --  13.5* 14.3*  HGB 11.4*   < > 11.1* 11.2* 11.4*  HCT 37.5   < > 36.8 36.3 35.8*  MCV 97.4   < > 97.4 95.0 93.7  PLT 240   < > 238 251 257   < > = values in this interval not displayed.   Basic Metabolic Panel: Recent Labs  Lab 10/10/19 1144 10/11/19 0813 10/12/19 0109  NA 142 146* 146*  K 3.7 3.6 3.5  CL 99 102 103  CO2 29 28 29   GLUCOSE 119* 128* 150*  BUN 61* 75* 89*  CREATININE 3.02* 2.83* 3.10*  CALCIUM 8.4* 8.9 8.9   GFR: Estimated Creatinine Clearance: 9.9 mL/min (A) (by C-G formula based on SCr of 3.1 mg/dL (H)).  Liver Function Tests: Recent Labs  Lab 10/13/2019 1802 10/10/19 1144 10/11/19 0813  10/12/19 0109  AST 125* 127* 117* 104*  ALT 69* 71* 69* 62*  ALKPHOS 82 77 73 76  BILITOT 0.6 0.6 0.7 0.5  PROT 6.7 6.2* 6.1* 6.0*  ALBUMIN 2.8* 2.6* 2.4* 2.5*   No results for input(s): LIPASE, AMYLASE in the last 168 hours. No results for input(s): AMMONIA in the last 168 hours.  Coagulation Profile: No results for input(s): INR, PROTIME in the last 168 hours.  Cardiac Enzymes: No results for input(s): CKTOTAL, CKMB, CKMBINDEX, TROPONINI in the last 168 hours.  HbA1C: No results found for: HGBA1C  CBG: Recent Labs  Lab 10/11/19 0814 10/11/19 1244 10/11/19 1656 10/11/19 2113 10/12/19 0848  GLUCAP 118* 113* 127* 100* 158*    Recent Results (from the past 240 hour(s))  SARS Coronavirus 2 by RT PCR (hospital order, performed in [facility] hospital lab) Nasopharyngeal Nasopharyngeal Swab     Status: Abnormal   Collection Time: 10/12/2019  5:40 PM   Specimen: Nasopharyngeal Swab  Result Value Ref Range Status   SARS Coronavirus 2 POSITIVE (A) NEGATIVE Final    Comment: RESULT CALLED TO, READ BACK BY AND VERIFIED WITH: RN RUGGIERO MEGAN AT 2035 BY [place] ON 10/14/2019 (NOTE) SARS-CoV-2 target nucleic acids are DETECTED  SARS-CoV-2 RNA is generally detectable in upper respiratory specimens  during the acute phase of infection.  Positive results are indicative  of the presence of the identified virus, but do not rule out bacterial infection or co-infection with other pathogens not detected by the test.  Clinical correlation with patient history and  other diagnostic information is necessary to determine patient infection status.  The expected result is negative.  Fact Sheet for Patients:   StrictlyIdeas.no   Fact Sheet for Healthcare Providers:   BankingDealers.co.za    This test is not yet approved or cleared by the [place] FDA and  has been authorized for detection and/or diagnosis of SARS-CoV-2 by FDA  under an Emergency Use Authorization (EUA).  This EUA will remain in  effect (meaning this test can be used) for the duration of  the COVID-19 declaration under Section 564(b)(1) of the Act, 21 U.S.C. section 360-bbb-3(b)(1), unless the authorization is terminated or revoked sooner.  Performed at [facility], [place] [address] [place], [place] [address]   Blood Culture (routine x 2)     Status: None (Preliminary result)   Collection Time: 10/26/2019  5:40 PM   Specimen: BLOOD  Result Value Ref Range Status   Specimen Description BLOOD LEFT ANTECUBITAL  Final   Special Requests   Final    BOTTLES DRAWN AEROBIC AND ANAEROBIC Blood Culture adequate volume   Culture   Final  NO GROWTH 2 DAYS Performed at [facility], [place] [address], [place], [place] [address]    Report Status PENDING  Incomplete  Blood Culture (routine x 2)     Status: None (Preliminary result)   Collection Time: 10/22/2019  6:49 PM   Specimen: BLOOD  Result Value Ref Range Status   Specimen Description BLOOD RIGHT ANTECUBITAL  Final   Special Requests   Final    BOTTLES DRAWN AEROBIC AND ANAEROBIC Blood Culture adequate volume   Culture   Final    NO GROWTH 2 DAYS Performed at [facility], [place] [address] [place],  [address]    Report Status PENDING  Incomplete     Scheduled Meds: . acetaminophen  1,000 mg Oral QHS  . albuterol  2 puff Inhalation Q6H  . allopurinol  100 mg Oral Daily  . amiodarone  200 mg Oral Daily  . apixaban  2.5 mg Oral BID  . atorvastatin  20 mg Oral Daily  . benzonatate  200 mg Oral TID  . calcitRIOL  0.25 mcg Oral Q M,W,F  . carvedilol  6.25 mg Oral BID WC  . cholecalciferol  2,000 Units Oral Daily  . diphenhydrAMINE  25 mg Oral QHS  . feeding supplement (ENSURE ENLIVE)  237 mL Oral BID BM  . fluticasone furoate-vilanterol  1 puff Inhalation Daily  . insulin aspart  0-15 Units Subcutaneous TID WC  . levothyroxine  75 mcg Oral Q0600  . methylPREDNISolone  (SOLU-MEDROL) injection  50 mg Intravenous Q12H  . pantoprazole  40 mg Oral Daily  . rOPINIRole  0.5 mg Oral QHS  . senna  1 tablet Oral BID  . sodium chloride flush  3 mL Intravenous Q12H  . vitamin B-12  1,000 mcg Oral Daily   Continuous Infusions: . sodium chloride    . remdesivir 100 mg in NS 100 mL 100 mg (10/12/19 0935)     LOS: 2 days   Cherene Altes, MD [facility] Office  [phone] Pager - Text Page per Amion  If 7PM-7AM, please contact night-coverage per Amion 10/12/2019, 10:07 AM

## 2019-10-12 NOTE — Progress Notes (Signed)
Initial Nutrition Assessment  DOCUMENTATION CODES:   Underweight  INTERVENTION:  Provide Ensure Enlive po TID, each supplement provides 350 kcal and 20 grams of protein.  Encourage adequate PO intake.   NUTRITION DIAGNOSIS:   Increased nutrient needs related to chronic illness (COPD, CHF) as evidenced by estimated needs.  GOAL:   Patient will meet greater than or equal to 90% of their needs  MONITOR:   PO intake, Supplement acceptance, Skin, Weight trends, Labs, I & O's  REASON FOR ASSESSMENT:   Malnutrition Screening Tool    ASSESSMENT:   86 year old with a history of chronic diastolic CHF, COPD, HTN, stage IV CKD, and HLD who presented with a 7-day history of worsening weakness and shortness of breath and was found to be acutely hypoxic with a confirmed Covid pneumonia.  RD working remotely.  Pt unavailable during attempted time of visit. RD unable to obtain pt nutrition history at this time. Meal completion has been 25-30%. Pt currently has Ensure ordered and has been consuming them. RD to increase Ensure to TID to aid in caloric and protein needs.   Unable to complete Nutrition-Focused physical exam at this time.   Labs and medications reviewed.   Diet Order:   Diet Order            Diet regular Room service appropriate? No; Fluid consistency: Thin  Diet effective now                 EDUCATION NEEDS:   Not appropriate for education at this time  Skin:  Skin Assessment: Reviewed RN Assessment  Last BM:  8/10  Height:   Ht Readings from Last 1 Encounters:  10/11/19 5\' 4"  (1.626 m)    Weight:   Wt Readings from Last 1 Encounters:  10/11/19 48 kg    Ideal Body Weight:  54.54 kg  BMI:  Body mass index is 18.16 kg/m.  Estimated Nutritional Needs:   Kcal:  1550-1750  Protein:  70-85 grams  Fluid:  >/= 1.5 L/day   Corrin Parker, MS, RD, LDN RD pager number/after hours weekend pager number on Am[employer].

## 2019-10-12 NOTE — Progress Notes (Signed)
   10/12/19 0520  Assess: MEWS Score  Temp 98.1 F (36.7 C)  BP (!) 140/103  Pulse Rate 98  ECG Heart Rate (!) 112  Resp (!) 24  Level of Consciousness Alert  SpO2 100 %  O2 Device Nasal Cannula  O2 Flow Rate (L/min) 3 L/min  Assess: MEWS Score  MEWS Temp 0  MEWS Systolic 0  MEWS Pulse 2  MEWS RR 1  MEWS LOC 0  MEWS Score 3  MEWS Score Color Yellow  Assess: if the MEWS score is Yellow or Red  Were vital signs taken at a resting state? No (pt had just had bath; changed)  Focused Assessment No change from prior assessment  Early Detection of Sepsis Score *See Row Information* Medium  MEWS guidelines implemented *See Row Information* Yes  Treat  MEWS Interventions Other (Comment) (yellow mews guidelines initiated)  Pain Scale 0-10  Pain Score 0  Take Vital Signs  Increase Vital Sign Frequency  Yellow: Q 2hr X 2 then Q 4hr X 2, if remains yellow, continue Q 4hrs  Escalate  MEWS: Escalate Yellow: discuss with charge nurse/RN and consider discussing with provider and RRT Cabin crew notified; continue Q4 VS for this pt.)  Notify: Charge Nurse/RN  Name of Charge Nurse/RN Notified [place], RN  Date Charge Nurse/RN Notified 10/12/19  Time Charge Nurse/RN Notified 0530  Notify: Provider  Provider Name/Title  (not notified per input from charge; continue Q4 VS)  Document  Patient Outcome Other (Comment) (yellow MEWS guidelines continued)  Progress note created (see row info) Yes

## 2019-10-12 NOTE — Progress Notes (Signed)
Occupational Therapy Evaluation Patient Details Name: Brenda [name] MRN: [id number] DOB: February 03, 1932 Today's Date: 10/12/2019    History of Present Illness Patient is a 86 y.o. female with PMHx of chronic diastolic heart failure, COPD, HTN, stage IV CKD, HLD-who presented with a 7-day history of weakness, worsening shortness of breath-she was found to have acute hypoxic respiratory failure in the setting of COVID-19 infection (unvaccinated)   Clinical Impression   PTA, pt lived at home with her son and daughter in law who provide 24/7 assistance. Pt states her family assists her to stand then she walks without an AD. Family assists with bathing/dressing as needed, but pt is able to ambulate independently to the bathroom and complete pericare once family assists her to stand. Pt currently requires min A for mobility and mod A with LB ADL. Desat to 87 on 4L with 2/4 DOE' Max HR 117 during limited mobility. Spoke with daughter over the phone, who also has Covid, and she confirmed functional status and states they will be able to assist at DC. Recommend pt DC home with HHOT and 24/7 assistance. Will follow acutely to facilitate safe DC home.     Follow Up Recommendations  Home health OT;Supervision/Assistance - 24 hour    Equipment Recommendations  None recommended by OT    Recommendations for Other Services PT consult     Precautions / Restrictions Precautions Precautions: Fall      Mobility Bed Mobility Overal bed mobility: Needs Assistance Bed Mobility: Supine to Sit     Supine to sit: Supervision        Transfers Overall transfer level: Needs assistance Equipment used: 1 person hand held assist Transfers: Sit to/from Stand;Stand Pivot Transfers Sit to Stand: Min assist Stand pivot transfers: Min assist            Balance                                           ADL either performed or assessed with clinical judgement   ADL Overall ADL's :  Needs assistance/impaired Eating/Feeding: Set up   Grooming: Set up;Sitting   Upper Body Bathing: Minimal assistance;Sitting   Lower Body Bathing: Moderate assistance;Sit to/from stand   Upper Body Dressing : Minimal assistance;Sitting   Lower Body Dressing: Maximal assistance;Sit to/from stand   Toilet Transfer: Minimal Print production planner Details (indicate cue type and reason): simulated Toileting- Clothing Manipulation and Hygiene: Minimal assistance       Functional mobility during ADLs: Minimal assistance (limited )       Vision   Additional Comments: pt states she does not have vision problems     Perception     Praxis      Pertinent Vitals/Pain Pain Assessment: Faces Faces Pain Scale: Hurts a little bit Pain Location: general discomfort Pain Descriptors / Indicators: Discomfort     Hand Dominance Right   Extremity/Trunk Assessment Upper Extremity Assessment Upper Extremity Assessment: Generalized weakness   Lower Extremity Assessment Lower Extremity Assessment: Defer to PT evaluation (BL weakness from surgery due to "blockage")   Cervical / Trunk Assessment Cervical / Trunk Assessment: Kyphotic   Communication Communication Communication: HOH   Cognition Arousal/Alertness: Awake/alert Behavior During Therapy: WFL for tasks assessed/performed Overall Cognitive Status: Difficult to assess  General Comments       Exercises Exercises: Other exercises Other Exercises Other Exercises: incentive spirometer x 10- ableto pull 500 ml   Shoulder Instructions      Home Living Family/patient expects to be discharged to:: Private residence Living Arrangements: Children Available Help at Discharge: Family;Available 24 hours/day Type of Home: House Home Access: Ramped entrance     Home Layout: One level     Bathroom Shower/Tub: Tub/shower unit;Curtain (unsure)   Bathroom Toilet:  Standard Bathroom Accessibility: No   Home Equipment: Shower seat;Bedside commode;Walker - 2 wheels;Cane - single point          Prior Functioning/Environment Level of Independence: Needs assistance  Gait / Transfers Assistance Needed: pt states ADL's / Homemaking Assistance Needed: Family assists with self care            OT Problem List: Decreased strength;Decreased activity tolerance;Decreased safety awareness;Decreased knowledge of use of DME or AE;Decreased cognition;Cardiopulmonary status limiting activity;Pain      OT Treatment/Interventions: Self-care/ADL training;Therapeutic exercise;Neuromuscular education;Energy conservation;DME and/or AE instruction;Therapeutic activities;Cognitive remediation/compensation;Patient/family education;Balance training    OT Goals(Current goals can be found in the care plan section) Acute Rehab OT Goals Patient Stated Goal: to go home with family OT Goal Formulation: With patient Time For Goal Achievement: 10/26/19 Potential to Achieve Goals: Good  OT Frequency: Min 3X/week   Barriers to D/C:            Co-evaluation              AM-PAC OT "6 Clicks" Daily Activity     Outcome Measure Help from another person eating meals?: None Help from another person taking care of personal grooming?: A Little Help from another person toileting, which includes using toliet, bedpan, or urinal?: A Little Help from another person bathing (including washing, rinsing, drying)?: A Lot Help from another person to put on and taking off regular upper body clothing?: A Little Help from another person to put on and taking off regular lower body clothing?: A Lot 6 Click Score: 17   End of Session Equipment Utilized During Treatment: Gait belt;Oxygen (4L) Nurse Communication: Mobility status  Activity Tolerance: Patient tolerated treatment well Patient left: in chair;with call bell/phone within reach;with chair alarm set  OT Visit Diagnosis:  Unsteadiness on feet (R26.81);Muscle weakness (generalized) (M62.81)                Time: 1430-1500 OT Time Calculation (min): 30 min Charges:  OT General Charges $OT Visit: 1 Visit OT Evaluation $OT Eval Moderate Complexity: 1 Mod OT Treatments $Self Care/Home Management : 8-22 mins  Maurie Boettcher, OT/L   Acute OT Clinical Specialist Acute Rehabilitation Services Pager [phone] Office [phone]   [facility] 10/12/2019, 3:17 PM

## 2019-10-12 NOTE — Evaluation (Signed)
.Physical Therapy Evaluation Patient Details Name: Brenda [name] MRN: [id number] DOB: [birth year] Today's Date: 10/12/2019   History of Present Illness  Patient is a 86 y.o. female with PMHx of chronic diastolic heart failure, COPD, HTN, stage IV CKD, HLD-who presented with a 7-day history of weakness, worsening shortness of breath-she was found to have acute hypoxic respiratory failure in the setting of COVID-19 infection (unvaccinated)  Clinical Impression  Pt admitted with above diagnosis. Pt presents with decreased endurance, safety, balance, mobility, and strength.  She was able to ambulate short distance in room but fatigued easily requiring rest breaks.  Pt was on4 LPM O2 rest and 6 LPM O2 walking with sats down to 86% walking but recovered quickly.  Pt reports 24 hr support and necessary DME at home. Pt currently with functional limitations due to the deficits listed below (see PT Problem List). Pt will benefit from skilled PT to increase their independence and safety with mobility to allow discharge to the venue listed below.       Follow Up Recommendations Home health PT;Supervision/Assistance - 24 hour    Equipment Recommendations  None recommended by PT (has dme)    Recommendations for Other Services       Precautions / Restrictions Precautions Precautions: Fall Precaution Comments: monitor O2      Mobility  Bed Mobility Overal bed mobility: Needs Assistance Bed Mobility: Supine to Sit     Supine to sit: Supervision     General bed mobility comments: in chair at arrival (was supervision with OT)  Transfers Overall transfer level: Needs assistance Equipment used: Rolling walker (2 wheeled) Transfers: Sit to/from Stand Sit to Stand: Min assist Stand pivot transfers: Min assist       General transfer comment: sit to stand x 3 with cues for safe hand placement  Ambulation/Gait Ambulation/Gait assistance: Min assist Gait Distance (Feet): 12 Feet (12' then  5') Assistive device: Rolling walker (2 wheeled) Gait Pattern/deviations: Step-to pattern;Decreased stride length;Shuffle;Trunk flexed Gait velocity: decreased   General Gait Details: Attempted with HHA but pt unable so got RW.  Pt ambulated short distance in room with min A for RW and cues for posture.  Fatigued easily.  Required seated rest break.  Pt reports fatigue so was slid closer to recliner for shorter walk back.  Stairs            Wheelchair Mobility    Modified Rankin (Stroke Patients Only)       Balance Overall balance assessment: Needs assistance Sitting-balance support: No upper extremity supported Sitting balance-Leahy Scale: Fair     Standing balance support: Bilateral upper extremity supported Standing balance-Leahy Scale: Poor Standing balance comment: required RW                             Pertinent Vitals/Pain Pain Assessment: Faces Faces Pain Scale: Hurts a little bit Pain Location: general discomfort Pain Descriptors / Indicators: Discomfort Pain Intervention(s): Limited activity within patient's tolerance;Monitored during session    Home Living Family/patient expects to be discharged to:: Private residence Living Arrangements: Children Available Help at Discharge: Family;Available 24 hours/day Type of Home: House Home Access: Ramped entrance     Home Layout: One level Home Equipment: Shower seat;Bedside commode;Walker - 2 wheels;Cane - single point      Prior Function Level of Independence: Needs assistance   Gait / Transfers Assistance Needed: Pt reports household ambulation without AD  ADL's / Homemaking Assistance Needed: Family  assists with self care        Hand Dominance   Dominant Hand: Right    Extremity/Trunk Assessment   Upper Extremity Assessment Upper Extremity Assessment: Defer to OT evaluation    Lower Extremity Assessment Lower Extremity Assessment: LLE deficits/detail;RLE deficits/detail RLE  Deficits / Details: ROM WFL; MMT 4/5 LLE Deficits / Details: ROM WFL; MMT 4/5    Cervical / Trunk Assessment Cervical / Trunk Assessment: Kyphotic  Communication   Communication: HOH  Cognition Arousal/Alertness: Awake/alert Behavior During Therapy: WFL for tasks assessed/performed Overall Cognitive Status: Difficult to assess                                 General Comments: Pt followed simple commands.      General Comments General comments (skin integrity, edema, etc.): Pt on 4 LPM O2 at rest with sats 93% ; increased to 6 LPM for walking with sats 86% during recovery (poor pleth during ambulation due to sensor on foot); pt recoverd in ~ 1 minute with rest and cues for pursed lip breathing; placed back on 4 LPM at rest    Exercises Other Exercises Other Exercises: incentive spirometer x 10- ableto pull 500 ml   Assessment/Plan    PT Assessment Patient needs continued PT services  PT Problem List Decreased strength;Decreased mobility;Decreased safety awareness;Decreased coordination;Decreased activity tolerance;Decreased balance;Cardiopulmonary status limiting activity;Decreased knowledge of use of DME       PT Treatment Interventions DME instruction;Therapeutic activities;Gait training;Patient/family education;Therapeutic exercise;Balance training;Functional mobility training    PT Goals (Current goals can be found in the Care Plan section)  Acute Rehab PT Goals Patient Stated Goal: to go home with family; does not want rehab PT Goal Formulation: With patient Time For Goal Achievement: 10/26/19 Potential to Achieve Goals: Good    Frequency Min 3X/week   Barriers to discharge        Co-evaluation               AM-PAC PT "6 Clicks" Mobility  Outcome Measure Help needed turning from your back to your side while in a flat bed without using bedrails?: A Little Help needed moving from lying on your back to sitting on the side of a flat bed without  using bedrails?: A Little Help needed moving to and from a bed to a chair (including a wheelchair)?: A Little Help needed standing up from a chair using your arms (e.g., wheelchair or bedside chair)?: A Little Help needed to walk in hospital room?: A Little Help needed climbing 3-5 steps with a railing? : A Lot 6 Click Score: 17    End of Session Equipment Utilized During Treatment: Gait belt;Oxygen Activity Tolerance: Patient tolerated treatment well Patient left: with chair alarm set;in chair;with call bell/phone within reach Nurse Communication: Mobility status PT Visit Diagnosis: Unsteadiness on feet (R26.81);Other abnormalities of gait and mobility (R26.89);Muscle weakness (generalized) (M62.81)    Time: 2992-4268 PT Time Calculation (min) (ACUTE ONLY): 26 min   Charges:   PT Evaluation $PT Eval Moderate Complexity: 1 Mod PT Treatments $Gait Training: 8-22 mins        Abran Richard, PT Acute Rehab Services Pager [phone] [facility] [place] 10/12/2019, 5:33 PM

## 2019-10-12 NOTE — Plan of Care (Signed)
  Problem: Clinical Measurements: Goal: Respiratory complications will improve Outcome: Progressing   Problem: Nutrition: Goal: Adequate nutrition will be maintained 10/12/2019 0228 by Violet Baldy, RN Outcome: Not Progressing 10/11/2019 2015 by Violet Baldy, RN Outcome: Not Progressing   Problem: Coping: Goal: Level of anxiety will decrease Outcome: Progressing   Problem: Respiratory: Goal: Will maintain a patent airway 10/12/2019 0228 by Violet Baldy, RN Outcome: Progressing 10/11/2019 2015 by Violet Baldy, RN Outcome: Progressing

## 2019-10-13 LAB — CBC WITH DIFFERENTIAL/PLATELET
Abs Immature Granulocytes: 0.11 10*3/uL — ABNORMAL HIGH (ref 0.00–0.07)
Basophils Absolute: 0 10*3/uL (ref 0.0–0.1)
Basophils Relative: 0 %
Eosinophils Absolute: 0 10*3/uL (ref 0.0–0.5)
Eosinophils Relative: 0 %
HCT: 34.8 % — ABNORMAL LOW (ref 36.0–46.0)
Hemoglobin: 10.9 g/dL — ABNORMAL LOW (ref 12.0–15.0)
Immature Granulocytes: 1 %
Lymphocytes Relative: 2 %
Lymphs Abs: 0.3 10*3/uL — ABNORMAL LOW (ref 0.7–4.0)
MCH: 29.6 pg (ref 26.0–34.0)
MCHC: 31.3 g/dL (ref 30.0–36.0)
MCV: 94.6 fL (ref 80.0–100.0)
Monocytes Absolute: 0.3 10*3/uL (ref 0.1–1.0)
Monocytes Relative: 2 %
Neutro Abs: 14.3 10*3/uL — ABNORMAL HIGH (ref 1.7–7.7)
Neutrophils Relative %: 95 %
Platelets: 209 10*3/uL (ref 150–400)
RBC: 3.68 MIL/uL — ABNORMAL LOW (ref 3.87–5.11)
RDW: 14.7 % (ref 11.5–15.5)
WBC: 15.1 10*3/uL — ABNORMAL HIGH (ref 4.0–10.5)
nRBC: 0.1 % (ref 0.0–0.2)

## 2019-10-13 LAB — COMPREHENSIVE METABOLIC PANEL
ALT: 49 U/L — ABNORMAL HIGH (ref 0–44)
AST: 64 U/L — ABNORMAL HIGH (ref 15–41)
Albumin: 2.5 g/dL — ABNORMAL LOW (ref 3.5–5.0)
Alkaline Phosphatase: 76 U/L (ref 38–126)
Anion gap: 12 (ref 5–15)
BUN: 92 mg/dL — ABNORMAL HIGH (ref 8–23)
CO2: 29 mmol/L (ref 22–32)
Calcium: 8.9 mg/dL (ref 8.9–10.3)
Chloride: 109 mmol/L (ref 98–111)
Creatinine, Ser: 2.87 mg/dL — ABNORMAL HIGH (ref 0.44–1.00)
GFR calc Af Amer: 17 mL/min — ABNORMAL LOW (ref >60)
GFR calc non Af Amer: 14 mL/min — ABNORMAL LOW (ref >60)
Glucose, Bld: 148 mg/dL — ABNORMAL HIGH (ref 70–99)
Potassium: 3.3 mmol/L — ABNORMAL LOW (ref 3.5–5.1)
Sodium: 150 mmol/L — ABNORMAL HIGH (ref 135–145)
Total Bilirubin: 0.9 mg/dL (ref 0.3–1.2)
Total Protein: 6 g/dL — ABNORMAL LOW (ref 6.5–8.1)

## 2019-10-13 LAB — FERRITIN: Ferritin: 142 ng/mL (ref 11–307)

## 2019-10-13 LAB — C-REACTIVE PROTEIN: CRP: 4.8 mg/dL — ABNORMAL HIGH (ref <1.0)

## 2019-10-13 LAB — GLUCOSE, CAPILLARY
Glucose-Capillary: 128 mg/dL — ABNORMAL HIGH (ref 70–99)
Glucose-Capillary: 128 mg/dL — ABNORMAL HIGH (ref 70–99)
Glucose-Capillary: 110 mg/dL — ABNORMAL HIGH (ref 70–99)
Glucose-Capillary: 152 mg/dL — ABNORMAL HIGH (ref 70–99)

## 2019-10-13 LAB — D-DIMER, QUANTITATIVE: D-Dimer, Quant: 4.75 ug/mL-FEU — ABNORMAL HIGH (ref 0.00–0.50)

## 2019-10-13 MED ORDER — POTASSIUM CHLORIDE CRYS ER 20 MEQ PO TBCR
40.0000 meq | EXTENDED_RELEASE_TABLET | Freq: Once | ORAL | Status: AC
  Administered 2019-10-13: 40 meq via ORAL
  Filled 2019-10-13: qty 2

## 2019-10-13 MED ORDER — SODIUM CHLORIDE 0.45 % IV SOLN: INTRAVENOUS | Status: DC

## 2019-10-13 MED ORDER — ENSURE ENLIVE PO LIQD
237.0000 mL | Freq: Three times a day (TID) | ORAL | Status: DC
  Administered 2019-10-13 – 2019-10-15 (×7): 237 mL via ORAL

## 2019-10-13 NOTE — Plan of Care (Signed)

## 2019-10-13 NOTE — Progress Notes (Signed)
  Speech Language Pathology Treatment: Dysphagia  Patient Details Name: Brenda [name] MRN: [id number] DOB: 1932-01-28 Today's Date: 10/13/2019 Time: 1510-1530 SLP Time Calculation (min) (ACUTE ONLY): 20 min  Assessment / Plan / Recommendation Clinical Impression  New orders received for swallow assessment and pt's diet downgraded to dysphagia 1/puree. Pt already on SLP caseload after initial eval 8/9.  Today she is less interactive, presented with more difficulty communicating her thoughts and was unable to describe eating/swallowing with any specifics. She is no longer wincing nor c/o pain in her mouth. She required a lot of encouragement to eat/drink during our session, and was ultimately willing to drink some water and have a few bites of pudding. There do not appear to be changes in swallow function based on clinical presentation, however, limited POs were consumed - there is adequate oral manipulation, no overt s/s of aspiration; no further belching.  Respiratory/swallowing coordination appears to be [facility]. Pt presents with an overall malaise and deconditioned state/frailty that may be impacting her motivation to eat.  D/W RN - recommend trying dysphagia 2 for now; OOB for meals if possible.  SLP will f/u briefly.   HPI HPI: 86 y.o. female with PMHx of chronic diastolic heart failure, COPD, HTN, stage IV CKD, HLD-who presented with a 7-day history of weakness, worsening shortness of breath-she was found to have acute hypoxic respiratory failure in the setting of COVID-19 infection (unvaccinated). Pt reported difficulty swallowing lunch tray; Swallow was evaluated in ED and regular/thins was recommended. New orders received 8/12 due to increased difficulty per RN.       SLP Plan  Continue with current plan of care       Recommendations  Diet recommendations: Dysphagia 2 (fine chop);Thin liquid Liquids provided via: Cup;Straw Medication Administration: Whole meds with puree Supervision:  Staff to assist with self feeding Postural Changes and/or Swallow Maneuvers: Seated upright 90 degrees;Upright 30-60 min after meal                Oral Care Recommendations: Oral care BID Follow up Recommendations: None SLP Visit Diagnosis: Dysphagia, unspecified (R13.10) Plan: Continue with current plan of care       GO                Juan Quam Laurice 10/13/2019, 3:42 PM  Marcee Jacobs L. Tivis Ringer, [place] Office number [phone] Pager [phone]

## 2019-10-13 NOTE — TOC Progression Note (Signed)
Transition of Care [facility]) - Progression Note    Patient Details  Name: Brenda [name] MRN: [id number] Date of Birth: 08-Sep-1931  Transition of Care [facility]) CM/SW Isabel, RN Phone Number: [phone]  10/13/2019, 10:24 AM  Clinical Narrative:    Patient is active with [place] for PT        Expected Discharge Plan and Services                                                 Social Determinants of Health (SDOH) Interventions    Readmission Risk Interventions No flowsheet data found.

## 2019-10-13 NOTE — Progress Notes (Signed)
Brenda [name]  UVO:[id number] DOB: Apr 05, 1931 DOA: 10/26/2019 PCP: Charolette Forward, MD    Brief Narrative:  86 year old with a history of chronic diastolic CHF, COPD, HTN, stage IV CKD, and HLD who presented with a 7-day history of worsening weakness and shortness of breath and was found to be acutely hypoxic with a confirmed Covid pneumonia.  Significant Events:  8/7 admit via [place] with hypoxia  Date of Positive COVID Test:  8/7  Vaccination Status: Unvaccinated  COVID-19 specific Treatment: Steroid 8/8 > Remdesivir 8/8 > 8/12  Antimicrobials:  Rocephin 8/8 > 8/9 Zithromax 8/9  DVT prophylaxis: Eliquis  Subjective: Did well with OT yesterday who suggest 24-hour supervision and assistance but otherwise HH OT.  PT had the same recommendation.  Currently saturations are in the mid to upper 90s on 3L [place] support.  Intermittent low-grade tachycardia in setting of atrial fibrillation is noted but otherwise vital signs are stable.  Patient denies any new complaints today.  Assessment & Plan:  Acute hypoxic respiratory failure -COVID Pneumonia Wean to room air as able -finishes her course of remdesivir today -remains on steroid -D-dimer significantly elevated but patient on chronic Eliquis  Recent Labs  Lab 10/11/2019 1802 10/11/2019 1846 10/10/19 0700 10/10/19 1144 10/11/19 0813 10/12/19 0109 10/13/19 0820  DDIMER 1.91*  --  2.66*  --  3.57* 4.18* 4.75*  FERRITIN  --  246 249  --  210 189  --   CRP  --  16.8*  --  13.2* 9.7* 7.8*  --   ALT 69*  --   --  71* 69* 62* 49*  PROCALCITON 0.31  --   --   --   --   --   --    Transaminitis Felt to be due to Covid and treatment of same -slowly improving  Recent Labs  Lab 11/01/2019 1802 10/10/19 1144 10/11/19 0813 10/12/19 0109 10/13/19 0820  AST 125* 127* 117* 104* 64*  ALT 69* 71* 69* 62* 49*  ALKPHOS 82 77 73 76 76  BILITOT 0.6 0.6 0.7 0.5 0.9  PROT 6.7 6.2* 6.1* 6.0* 6.0*  ALBUMIN 2.8* 2.6* 2.4* 2.5* 2.5*      Hypokalemia Likely due to poor intake -supplement  Hypernatremia Likely simple dehydration -change to half-normal saline and follow up in AM  COPD Continue usual bronchodilators with no evidence of an acute exacerbation  HTN Blood pressure currently well controlled  Chronic atrial fibrillation Continue amiodarone and Eliquis -rate controlled  Chronic diastolic CHF No evidence of volume overload at present and in fact patient likely slightly dehydrated  CKD stage IV Followed by Dr. Royce Macadamia -creatinine stable/improving  Recent Labs  Lab 10/17/2019 1802 10/10/19 1144 10/11/19 0813 10/12/19 0109 10/13/19 0820  CREATININE 3.63* 3.02* 2.83* 3.10* 2.87*    Debility/deconditioning  Palliative care discussion Goals of care were discussed with patient and she confirmed she wishes to remain a full code    Code Status: FULL CODE Family Communication:  Status is: Inpatient  Remains inpatient appropriate because:Inpatient level of care appropriate due to severity of illness   Dispo: The patient is from: Home              Anticipated d/c is to: Unclear              Anticipated d/c date is: 1 day              Patient currently is not medically stable to d/c.    Consultants:  none  Objective: Blood pressure 135/88, pulse 88, temperature 98.6 F (37 C), temperature source Axillary, resp. rate (!) 22, height 5\' 4"  (1.626 m), weight 48 kg, SpO2 93 %.  Intake/Output Summary (Last 24 hours) at 10/13/2019 0959 Last data filed at 10/13/2019 0843 Gross per 24 hour  Intake 1686.6 ml  Output 700 ml  Net 986.6 ml   Filed Weights   10/06/2019 1700 10/11/19 1553  Weight: 52.2 kg 48 kg    Examination: General: No acute respiratory distress Lungs: Scattered fine crackles with good air movement throughout all fields otherwise Cardiovascular: Irregularly irregular without murmur  Abdomen: NT/ND, soft, BS positive, no rebound Extremities: No significant cyanosis, clubbing,  or edema bilateral lower extremities  CBC: Recent Labs  Lab 10/11/19 0813 10/12/19 0109 10/13/19 0820  WBC 14.6* 15.3* 15.1*  NEUTROABS 13.5* 14.3* 14.3*  HGB 11.2* 11.4* 10.9*  HCT 36.3 35.8* 34.8*  MCV 95.0 93.7 94.6  PLT 251 257 366   Basic Metabolic Panel: Recent Labs  Lab 10/11/19 0813 10/12/19 0109 10/13/19 0820  NA 146* 146* 150*  K 3.6 3.5 3.3*  CL 102 103 109  CO2 28 29 29   GLUCOSE 128* 150* 148*  BUN 75* 89* 92*  CREATININE 2.83* 3.10* 2.87*  CALCIUM 8.9 8.9 8.9   GFR: Estimated Creatinine Clearance: 10.7 mL/min (A) (by C-G formula based on SCr of 2.87 mg/dL (H)).  Liver Function Tests: Recent Labs  Lab 10/10/19 1144 10/11/19 0813 10/12/19 0109 10/13/19 0820  AST 127* 117* 104* 64*  ALT 71* 69* 62* 49*  ALKPHOS 77 73 76 76  BILITOT 0.6 0.7 0.5 0.9  PROT 6.2* 6.1* 6.0* 6.0*  ALBUMIN 2.6* 2.4* 2.5* 2.5*    CBG: Recent Labs  Lab 10/12/19 0848 10/12/19 1155 10/12/19 1640 10/12/19 2102 10/13/19 0754  GLUCAP 158* 152* 109* 147* 128*    Recent Results (from the past 240 hour(s))  SARS Coronavirus 2 by RT PCR (hospital order, performed in [facility] hospital lab) Nasopharyngeal Nasopharyngeal Swab     Status: Abnormal   Collection Time: 10/18/2019  5:40 PM   Specimen: Nasopharyngeal Swab  Result Value Ref Range Status   SARS Coronavirus 2 POSITIVE (A) NEGATIVE Final    Comment: RESULT CALLED TO, READ BACK BY AND VERIFIED WITH: RN RUGGIERO MEGAN AT 2035 BY [place] ON 10/03/2019 (NOTE) SARS-CoV-2 target nucleic acids are DETECTED  SARS-CoV-2 RNA is generally detectable in upper respiratory specimens  during the acute phase of infection.  Positive results are indicative  of the presence of the identified virus, but do not rule out bacterial infection or co-infection with other pathogens not detected by the test.  Clinical correlation with patient history and  other diagnostic information is necessary to determine patient infection  status.  The expected result is negative.  Fact Sheet for Patients:   StrictlyIdeas.no   Fact Sheet for Healthcare Providers:   BankingDealers.co.za    This test is not yet approved or cleared by the [place] FDA and  has been authorized for detection and/or diagnosis of SARS-CoV-2 by FDA under an Emergency Use Authorization (EUA).  This EUA will remain in  effect (meaning this test can be used) for the duration of  the COVID-19 declaration under Section 564(b)(1) of the Act, 21 U.S.C. section 360-bbb-3(b)(1), unless the authorization is terminated or revoked sooner.  Performed at [facility], [place] [address] [place], [place] [address]   Blood Culture (routine x 2)     Status: None (Preliminary result)  Collection Time: 10/24/2019  5:40 PM   Specimen: BLOOD  Result Value Ref Range Status   Specimen Description BLOOD LEFT ANTECUBITAL  Final   Special Requests   Final    BOTTLES DRAWN AEROBIC AND ANAEROBIC Blood Culture adequate volume   Culture   Final    NO GROWTH 4 DAYS Performed at [facility], [place] [address] [place], [place] [address]    Report Status PENDING  Incomplete  Blood Culture (routine x 2)     Status: None (Preliminary result)   Collection Time: 10/18/2019  6:49 PM   Specimen: BLOOD  Result Value Ref Range Status   Specimen Description BLOOD RIGHT ANTECUBITAL  Final   Special Requests   Final    BOTTLES DRAWN AEROBIC AND ANAEROBIC Blood Culture adequate volume   Culture   Final    NO GROWTH 4 DAYS Performed at [facility], [place] [address], [place],  [address]    Report Status PENDING  Incomplete     Scheduled Meds: . acetaminophen  1,000 mg Oral QHS  . allopurinol  100 mg Oral Daily  . amiodarone  200 mg Oral Daily  . apixaban  2.5 mg Oral BID  . atorvastatin  20 mg Oral Daily  . benzonatate  200 mg Oral TID  . calcitRIOL  0.25 mcg Oral Q M,W,F  . carvedilol  6.25 mg Oral BID  WC  . cholecalciferol  2,000 Units Oral Daily  . diphenhydrAMINE  25 mg Oral QHS  . feeding supplement (ENSURE ENLIVE)  237 mL Oral BID BM  . fluticasone furoate-vilanterol  1 puff Inhalation Daily  . insulin aspart  0-15 Units Subcutaneous TID WC  . levothyroxine  75 mcg Oral Q0600  . methylPREDNISolone (SOLU-MEDROL) injection  40 mg Intravenous Q12H  . pantoprazole  40 mg Oral Daily  . rOPINIRole  0.5 mg Oral QHS  . senna  1 tablet Oral BID  . sodium chloride flush  3 mL Intravenous Q12H  . vitamin B-12  1,000 mcg Oral Daily   Continuous Infusions: . sodium chloride    . sodium chloride 75 mL/hr at 10/13/19 0934  . remdesivir 100 mg in NS 100 mL 100 mg (10/13/19 0931)     LOS: 3 days   Cherene Altes, MD [facility] Office  [phone] Pager - Text Page per Amion  If 7PM-7AM, please contact night-coverage per Amion 10/13/2019, 9:59 AM

## 2019-10-14 LAB — COMPREHENSIVE METABOLIC PANEL
ALT: 44 U/L (ref 0–44)
AST: 53 U/L — ABNORMAL HIGH (ref 15–41)
Albumin: 2.6 g/dL — ABNORMAL LOW (ref 3.5–5.0)
Alkaline Phosphatase: 68 U/L (ref 38–126)
Anion gap: 13 (ref 5–15)
BUN: 89 mg/dL — ABNORMAL HIGH (ref 8–23)
CO2: 26 mmol/L (ref 22–32)
Calcium: 9 mg/dL (ref 8.9–10.3)
Chloride: 114 mmol/L — ABNORMAL HIGH (ref 98–111)
Creatinine, Ser: 2.64 mg/dL — ABNORMAL HIGH (ref 0.44–1.00)
GFR calc Af Amer: 18 mL/min — ABNORMAL LOW (ref >60)
GFR calc non Af Amer: 16 mL/min — ABNORMAL LOW (ref >60)
Glucose, Bld: 129 mg/dL — ABNORMAL HIGH (ref 70–99)
Potassium: 3.9 mmol/L (ref 3.5–5.1)
Sodium: 153 mmol/L — ABNORMAL HIGH (ref 135–145)
Total Bilirubin: 1 mg/dL (ref 0.3–1.2)
Total Protein: 5.9 g/dL — ABNORMAL LOW (ref 6.5–8.1)

## 2019-10-14 LAB — GLUCOSE, CAPILLARY
Glucose-Capillary: 129 mg/dL — ABNORMAL HIGH (ref 70–99)
Glucose-Capillary: 111 mg/dL — ABNORMAL HIGH (ref 70–99)
Glucose-Capillary: 128 mg/dL — ABNORMAL HIGH (ref 70–99)
Glucose-Capillary: 89 mg/dL (ref 70–99)

## 2019-10-14 LAB — CULTURE, BLOOD (ROUTINE X 2)
Culture: NO GROWTH
Culture: NO GROWTH
Special Requests: ADEQUATE
Special Requests: ADEQUATE

## 2019-10-14 NOTE — Plan of Care (Signed)
  Problem: Health Behavior/Discharge Planning: Goal: Ability to manage health-related needs will improve Outcome: Not Progressing   Problem: Nutrition: Goal: Adequate nutrition will be maintained Outcome: Not Progressing  Patient needing extensive cueing to take medications or eat meals/snacks. Patient without coughing during food or med intake.  Problem: Activity: Goal: Risk for activity intolerance will decrease Outcome: Progressing  Patient up to bedside commode and up in room with PT, no desaturation and no comoplaints of dypnea.

## 2019-10-14 NOTE — Progress Notes (Signed)
Brenda [name]  OZY:[id number] DOB: 08-13-1931 DOA: 10/23/2019 PCP: Charolette Forward, MD    Brief Narrative:  86 year old with a history of chronic diastolic CHF, COPD, HTN, stage IV CKD, and HLD who presented with a 7-day history of worsening weakness and shortness of breath and was found to be acutely hypoxic with a confirmed Covid pneumonia.  Significant Events:  8/7 admit via [place] with hypoxia  Date of Positive COVID Test:  8/7  Vaccination Status: Unvaccinated  COVID-19 specific Treatment: Steroid 8/8 > Remdesivir 8/8 > 8/12  Antimicrobials:  Rocephin 8/8 > 8/9 Zithromax 8/9  DVT prophylaxis: Eliquis  Subjective: Oxygen saturations are 88% or better on 2 L nasal cannula.  Saturations declined below 80% on 6 L when ambulated 8/11. Today she was much more withdrawn and did not participate w/ PT to a significant extent. She denies CP, N/V, or abdom pain.   Assessment & Plan:  Acute hypoxic respiratory failure -COVID Pneumonia Has completed a course of remdesivir -making progress with weaning of oxygen -remains on steroid  Recent Labs  Lab 10/21/2019 1802 10/11/2019 1802 10/17/2019 1846 10/10/19 0700 10/10/19 1144 10/11/19 0813 10/12/19 0109 10/13/19 0820 10/14/19 0415  DDIMER 1.91*  --   --  2.66*  --  3.57* 4.18* 4.75*  --   FERRITIN  --   --  246 249  --  210 189 142  --   CRP  --   --  16.8*  --  13.2* 9.7* 7.8* 4.8*  --   ALT 69*   < >  --   --  71* 69* 62* 49* 44  PROCALCITON 0.31  --   --   --   --   --   --   --   --    < > = values in this interval not displayed.   Climbing D-dimer D-dimer now markedly elevated - pt already on Eliquis - denies cp to suggest PE  Transaminitis Felt to be due to Covid and treatment of same -improving  Recent Labs  Lab 10/10/19 1144 10/11/19 0813 10/12/19 0109 10/13/19 0820 10/14/19 0415  AST 127* 117* 104* 64* 53*  ALT 71* 69* 62* 49* 44  ALKPHOS 77 73 76 76 68  BILITOT 0.6 0.7 0.5 0.9 1.0  PROT 6.2* 6.1* 6.0*  6.0* 5.9*  ALBUMIN 2.6* 2.4* 2.5* 2.5* 2.6*     Hypokalemia Likely due to poor intake - corrected with supplementation  Hypernatremia Likely simple dehydration - encouraged oral free water intake - recheck in AM  COPD Continue usual bronchodilators with no evidence of an acute exacerbation  HTN Blood pressure currently well controlled  Chronic atrial fibrillation Continue amiodarone and Eliquis -rate controlled  Chronic diastolic CHF No evidence of volume overload at present and in fact patient likely slightly dehydrated  CKD stage IV Followed by Dr. Royce Macadamia -creatinine stable/improving  Recent Labs  Lab 10/10/19 1144 10/11/19 0813 10/12/19 0109 10/13/19 0820 10/14/19 0415  CREATININE 3.02* 2.83* 3.10* 2.87* 2.64*    Debility/deconditioning Disposition will be challenging as family currently unable to care for patient at home due to 2 other sick family members in the home who are requiring high level of care - cont to ambulate and wean O2 - family hopeful to be able to care for her at home within 48-72hrs   Palliative care discussion Goals of care were discussed with patient and she confirmed she wishes to remain a full code   Code Status: FULL CODE Family  Communication:  Status is: Inpatient  Remains inpatient appropriate because:Inpatient level of care appropriate due to severity of illness   Dispo: The patient is from: Home              Anticipated d/c is to: Unclear              Anticipated d/c date is: 2 days              Patient currently is not medically stable to d/c.    Consultants:  none  Objective: Blood pressure 129/87, pulse (!) 102, temperature (!) 97.5 F (36.4 C), temperature source Axillary, resp. rate 20, height 5\' 4"  (1.626 m), weight 48 kg, SpO2 96 %.  Intake/Output Summary (Last 24 hours) at 10/14/2019 1746 Last data filed at 10/14/2019 1525 Gross per 24 hour  Intake 1571.63 ml  Output 300 ml  Net 1271.63 ml   Filed Weights    10/27/2019 1700 10/11/19 1553  Weight: 52.2 kg 48 kg    Examination: General: No acute respiratory distress Lungs: Scattered fine crackles th/o - no wheezing  Cardiovascular: Irregularly irregular - no M or rub  Abdomen: NT/ND, soft, BS positive, no rebound Extremities: No C/C/E B LE   CBC: Recent Labs  Lab 10/11/19 0813 10/12/19 0109 10/13/19 0820  WBC 14.6* 15.3* 15.1*  NEUTROABS 13.5* 14.3* 14.3*  HGB 11.2* 11.4* 10.9*  HCT 36.3 35.8* 34.8*  MCV 95.0 93.7 94.6  PLT 251 257 771   Basic Metabolic Panel: Recent Labs  Lab 10/12/19 0109 10/13/19 0820 10/14/19 0415  NA 146* 150* 153*  K 3.5 3.3* 3.9  CL 103 109 114*  CO2 29 29 26   GLUCOSE 150* 148* 129*  BUN 89* 92* 89*  CREATININE 3.10* 2.87* 2.64*  CALCIUM 8.9 8.9 9.0   GFR: Estimated Creatinine Clearance: 11.6 mL/min (A) (by C-G formula based on SCr of 2.64 mg/dL (H)).  Liver Function Tests: Recent Labs  Lab 10/11/19 0813 10/12/19 0109 10/13/19 0820 10/14/19 0415  AST 117* 104* 64* 53*  ALT 69* 62* 49* 44  ALKPHOS 73 76 76 68  BILITOT 0.7 0.5 0.9 1.0  PROT 6.1* 6.0* 6.0* 5.9*  ALBUMIN 2.4* 2.5* 2.5* 2.6*    CBG: Recent Labs  Lab 10/13/19 1653 10/13/19 2120 10/14/19 0759 10/14/19 1159 10/14/19 1714  GLUCAP 110* 152* 129* 111* 128*    Recent Results (from the past 240 hour(s))  SARS Coronavirus 2 by RT PCR (hospital order, performed in [facility] hospital lab) Nasopharyngeal Nasopharyngeal Swab     Status: Abnormal   Collection Time: 10/17/2019  5:40 PM   Specimen: Nasopharyngeal Swab  Result Value Ref Range Status   SARS Coronavirus 2 POSITIVE (A) NEGATIVE Final    Comment: RESULT CALLED TO, READ BACK BY AND VERIFIED WITH: RN RUGGIERO MEGAN AT 2035 BY [place] ON 10/19/2019 (NOTE) SARS-CoV-2 target nucleic acids are DETECTED  SARS-CoV-2 RNA is generally detectable in upper respiratory specimens  during the acute phase of infection.  Positive results are indicative  of the presence of  the identified virus, but do not rule out bacterial infection or co-infection with other pathogens not detected by the test.  Clinical correlation with patient history and  other diagnostic information is necessary to determine patient infection status.  The expected result is negative.  Fact Sheet for Patients:   StrictlyIdeas.no   Fact Sheet for Healthcare Providers:   BankingDealers.co.za    This test is not yet approved or cleared by the [place]  FDA and  has been authorized for detection and/or diagnosis of SARS-CoV-2 by FDA under an Emergency Use Authorization (EUA).  This EUA will remain in  effect (meaning this test can be used) for the duration of  the COVID-19 declaration under Section 564(b)(1) of the Act, 21 U.S.C. section 360-bbb-3(b)(1), unless the authorization is terminated or revoked sooner.  Performed at [facility], [place] [address] [place], [place] [address]   Blood Culture (routine x 2)     Status: None   Collection Time: 10/05/2019  5:40 PM   Specimen: BLOOD  Result Value Ref Range Status   Specimen Description BLOOD LEFT ANTECUBITAL  Final   Special Requests   Final    BOTTLES DRAWN AEROBIC AND ANAEROBIC Blood Culture adequate volume   Culture   Final    NO GROWTH 5 DAYS Performed at [facility], [place] [address] [place], [place] [address]    Report Status 10/14/2019 FINAL  Final  Blood Culture (routine x 2)     Status: None   Collection Time: 11/01/2019  6:49 PM   Specimen: BLOOD  Result Value Ref Range Status   Specimen Description BLOOD RIGHT ANTECUBITAL  Final   Special Requests   Final    BOTTLES DRAWN AEROBIC AND ANAEROBIC Blood Culture adequate volume   Culture   Final    NO GROWTH 5 DAYS Performed at [facility], [place] [address], [place], [place] [address]    Report Status 10/14/2019 FINAL  Final     Scheduled Meds: . acetaminophen  1,000 mg Oral QHS  . allopurinol  100  mg Oral Daily  . amiodarone  200 mg Oral Daily  . apixaban  2.5 mg Oral BID  . atorvastatin  20 mg Oral Daily  . benzonatate  200 mg Oral TID  . calcitRIOL  0.25 mcg Oral Q M,W,F  . carvedilol  6.25 mg Oral BID WC  . cholecalciferol  2,000 Units Oral Daily  . diphenhydrAMINE  25 mg Oral QHS  . feeding supplement (ENSURE ENLIVE)  237 mL Oral TID BM  . fluticasone furoate-vilanterol  1 puff Inhalation Daily  . insulin aspart  0-15 Units Subcutaneous TID WC  . levothyroxine  75 mcg Oral Q0600  . methylPREDNISolone (SOLU-MEDROL) injection  40 mg Intravenous Q12H  . pantoprazole  40 mg Oral Daily  . rOPINIRole  0.5 mg Oral QHS  . senna  1 tablet Oral BID  . vitamin B-12  1,000 mcg Oral Daily     LOS: 4 days   Cherene Altes, MD [facility] Office  [phone] Pager - Text Page per Amion  If 7PM-7AM, please contact night-coverage per Amion 10/14/2019, 5:46 PM

## 2019-10-14 NOTE — Discharge Instructions (Signed)

## 2019-10-14 NOTE — Progress Notes (Signed)
Physical Therapy Treatment Patient Details Name: Brenda [name] MRN: [id number] DOB: March 29, 1931 Today's Date: 10/14/2019    History of Present Illness Patient is a 86 y.o. female with PMHx of chronic diastolic heart failure, COPD, HTN, stage IV CKD, HLD-who presented with a 7-day history of weakness, worsening shortness of breath-she was found to have acute hypoxic respiratory failure in the setting of COVID-19 infection (unvaccinated)    PT Comments    Pt curled up in sidelying fetal position in recliner on arrival. Pt very disengaged. Not making eye contact and not interacting with therapist. Following simple commands but no verbalizations. She required min assist transfers and min assist ambulation 15' with RW. Desat to 91% on RA during mobility. Pt declining further ambulation distance and initiating return to recliner. Attempted LE exercises. Pt participated in ankle pumps and heel slides, then declined further exercise. Pt appears sad and withdrawn. Pt on 2L O2 at end of session with SpO2 98%.    Follow Up Recommendations  Home health PT;Supervision/Assistance - 24 hour     Equipment Recommendations  None recommended by PT    Recommendations for Other Services       Precautions / Restrictions Precautions Precautions: Fall;Other (comment) Precaution Comments: monitor O2 Restrictions Weight Bearing Restrictions: No    Mobility  Bed Mobility Overal bed mobility: Needs Assistance             General bed mobility comments: Pt received in recliner and returned to recliner.  Transfers Overall transfer level: Needs assistance Equipment used: Rolling walker (2 wheeled) Transfers: Sit to/from Stand Sit to Stand: Min assist Stand pivot transfers: Min assist       General transfer comment: assist to power up and stabilize balance.  Ambulation/Gait Ambulation/Gait assistance: Min assist Gait Distance (Feet): 15 Feet Assistive device: Rolling walker (2 wheeled) Gait  Pattern/deviations: Step-through pattern;Decreased stride length;Trunk flexed;Shuffle Gait velocity: decreased Gait velocity interpretation: <1.8 ft/sec, indicate of risk for recurrent falls General Gait Details: SpO2 100% at rest on 2L. Mobilized on RA with desat to 91%. 3/4 DOE. SpO2 returned to 98% when 2L O2 replaced in recliner.   Stairs             Wheelchair Mobility    Modified Rankin (Stroke Patients Only)       Balance Overall balance assessment: Needs assistance Sitting-balance support: Feet supported;No upper extremity supported Sitting balance-Leahy Scale: Fair     Standing balance support: Bilateral upper extremity supported;During functional activity Standing balance-Leahy Scale: Poor Standing balance comment: reliant on external support                            Cognition Arousal/Alertness: Awake/alert Behavior During Therapy: Flat affect Overall Cognitive Status: Difficult to assess                                 General Comments: Minimal interaction. No verbalizations. She did shake her head yes or no in response to some questions. Disengaged/flat. Following simple commands.      Exercises General Exercises - Lower Extremity Ankle Circles/Pumps: AROM;Both;10 reps Heel Slides: AROM;Left;Right;5 reps    General Comments General comments (skin integrity, edema, etc.): SpO2 100% at rest on 2L. Desat to 91% on RA during amb.      Pertinent Vitals/Pain Pain Assessment: No/denies pain    Home Living  Prior Function            PT Goals (current goals can now be found in the care plan section) Acute Rehab PT Goals Patient Stated Goal: not stated Progress towards PT goals: Not progressing toward goals - comment (disengaged in session)    Frequency    Min 3X/week      PT Plan Current plan remains appropriate    Co-evaluation              AM-PAC PT "6 Clicks" Mobility    Outcome Measure  Help needed turning from your back to your side while in a flat bed without using bedrails?: A Little Help needed moving from lying on your back to sitting on the side of a flat bed without using bedrails?: A Little Help needed moving to and from a bed to a chair (including a wheelchair)?: A Little Help needed standing up from a chair using your arms (e.g., wheelchair or bedside chair)?: A Little Help needed to walk in hospital room?: A Little Help needed climbing 3-5 steps with a railing? : A Lot 6 Click Score: 17    End of Session Equipment Utilized During Treatment: Gait belt;Oxygen Activity Tolerance: Other (comment) (disengaged, uninterested in participation) Patient left: in chair;with chair alarm set;with call bell/phone within reach Nurse Communication: Mobility status PT Visit Diagnosis: Unsteadiness on feet (R26.81);Other abnormalities of gait and mobility (R26.89);Muscle weakness (generalized) (M62.81)     Time: 2703-5009 PT Time Calculation (min) (ACUTE ONLY): 23 min  Charges:  $Gait Training: 23-37 mins                     Lorrin Goodell, [place]  Office # [phone] Pager [place], [place] 10/14/2019, 1:51 PM

## 2019-10-15 ENCOUNTER — Inpatient Hospital Stay (HOSPITAL_COMMUNITY): Payer: Medicare Other

## 2019-10-15 LAB — GLUCOSE, CAPILLARY
Glucose-Capillary: 109 mg/dL — ABNORMAL HIGH (ref 70–99)
Glucose-Capillary: 114 mg/dL — ABNORMAL HIGH (ref 70–99)
Glucose-Capillary: 133 mg/dL — ABNORMAL HIGH (ref 70–99)
Glucose-Capillary: 169 mg/dL — ABNORMAL HIGH (ref 70–99)

## 2019-10-15 LAB — URINALYSIS, ROUTINE W REFLEX MICROSCOPIC
Bacteria, UA: NONE SEEN
Bilirubin Urine: NEGATIVE
Glucose, UA: NEGATIVE mg/dL
Ketones, ur: NEGATIVE mg/dL
Leukocytes,Ua: NEGATIVE
Nitrite: NEGATIVE
Protein, ur: 30 mg/dL — AB
Specific Gravity, Urine: 1.013 (ref 1.005–1.030)
pH: 5 (ref 5.0–8.0)

## 2019-10-15 LAB — COMPREHENSIVE METABOLIC PANEL
ALT: 37 U/L (ref 0–44)
AST: 49 U/L — ABNORMAL HIGH (ref 15–41)
Albumin: 2.3 g/dL — ABNORMAL LOW (ref 3.5–5.0)
Alkaline Phosphatase: 63 U/L (ref 38–126)
Anion gap: 11 (ref 5–15)
BUN: 84 mg/dL — ABNORMAL HIGH (ref 8–23)
CO2: 23 mmol/L (ref 22–32)
Calcium: 8.6 mg/dL — ABNORMAL LOW (ref 8.9–10.3)
Chloride: 117 mmol/L — ABNORMAL HIGH (ref 98–111)
Creatinine, Ser: 2.53 mg/dL — ABNORMAL HIGH (ref 0.44–1.00)
GFR calc Af Amer: 19 mL/min — ABNORMAL LOW (ref >60)
GFR calc non Af Amer: 17 mL/min — ABNORMAL LOW (ref >60)
Glucose, Bld: 142 mg/dL — ABNORMAL HIGH (ref 70–99)
Potassium: 4.9 mmol/L (ref 3.5–5.1)
Sodium: 151 mmol/L — ABNORMAL HIGH (ref 135–145)
Total Bilirubin: 0.8 mg/dL (ref 0.3–1.2)
Total Protein: 5.4 g/dL — ABNORMAL LOW (ref 6.5–8.1)

## 2019-10-15 LAB — MAGNESIUM: Magnesium: 2.7 mg/dL — ABNORMAL HIGH (ref 1.7–2.4)

## 2019-10-15 MED ORDER — DEXTROSE-NACL 5-0.45 % IV SOLN: INTRAVENOUS | Status: DC

## 2019-10-15 MED ORDER — ENOXAPARIN SODIUM 60 MG/0.6ML ~~LOC~~ SOLN
1.0000 mg/kg | SUBCUTANEOUS | Status: DC
  Administered 2019-10-15: 47.5 mg via SUBCUTANEOUS
  Filled 2019-10-15: qty 0.6

## 2019-10-15 MED ORDER — METHYLPREDNISOLONE SODIUM SUCC 40 MG IJ SOLR
40.0000 mg | Freq: Every day | INTRAMUSCULAR | Status: DC
  Administered 2019-10-15: 40 mg via INTRAVENOUS
  Filled 2019-10-15: qty 1

## 2019-10-15 NOTE — Progress Notes (Signed)
Brenda [name]  QVZ:[id number] DOB: [birth year] DOA: 10/17/2019 PCP: Charolette Forward, MD    Brief Narrative:  86 year old with a history of chronic diastolic CHF, COPD, HTN, stage IV CKD, and HLD who presented with a 7-day history of worsening weakness and shortness of breath and was found to be acutely hypoxic with a confirmed Covid pneumonia.  Significant Events:  8/7 admit via [place] with hypoxia  Date of Positive COVID Test:  8/7  Vaccination Status: Unvaccinated  COVID-19 specific Treatment: Steroid 8/8 > Remdesivir 8/8 > 8/12  Antimicrobials:  Rocephin 8/8 > 8/9 Zithromax 8/9  DVT prophylaxis: Eliquis  Subjective:  Patient in bed less responsive, apparently she has been gradually declining since yesterday, she became unresponsive sometime last night according to the staff, she is only mumbling words, moving all 4 extremities to painful stimuli.  Assessment & Plan:  Acute hypoxic respiratory failure -COVID Pneumonia, has completed a course of remdesivir -making progress with weaning of oxygen -remains on steroid    Recent Labs  Lab 10/29/2019 1740 10/05/2019 1801 10/25/2019 1802 10/22/2019 1846 10/02/2019 1848 10/10/19 0700 10/10/19 1144 10/11/19 0813 10/12/19 0109 10/13/19 0820  CRP  --   --   --  16.8*  --   --  13.2* 9.7* 7.8* 4.8*  DDIMER  --   --  1.91*  --   --  2.66*  --  3.57* 4.18* 4.75*  BNP  --   --   --   --   --   --   --  416.2*  --   --   PROCALCITON  --   --  0.31  --   --   --   --   --   --   --   LATICACIDVEN  --  1.4  --   --  1.0  --   --   --   --   --   SARSCOV2NAA POSITIVE*  --   --   --   --   --   --   --   --   --       Hepatic Function Latest Ref Rng & Units 10/15/2019 10/14/2019 10/13/2019  Total Protein 6.5 - 8.1 g/dL 5.4(L) 5.9(L) 6.0(L)  Albumin 3.5 - 5.0 g/dL 2.3(L) 2.6(L) 2.5(L)  AST 15 - 41 U/L 49(H) 53(H) 64(H)  ALT 0 - 44 U/L 37 44 49(H)  Alk Phosphatase 38 - 126 U/L 63 68 76  Total Bilirubin 0.3 - 1.2 mg/dL 0.8 1.0 0.9    Bilirubin, Direct 0.0 - 0.3 mg/dL - - -   SpO2: 95 % O2 Flow Rate (L/min): 2 L/min   Gradually declining mental status.  Obtunded on 10/15/2019 which happened sometime late in the evening on 10/14/2019.  Obtain CT head to rule out a bleed, currently no focal deficits, this could be due to severe dehydration, aggressively hydrate, check bladder scan and UA and monitor.  Climbing D-dimer - D-dimer is rapidly rising, will switch from Eliquis to full dose Lovenox, check lower extremity venous duplex, if stable enough V/Q  however at baseline she is on Eliquis hence long-term management will not change. Due too advanced renal dysfunction not a candidate for CTA.  Transaminitis - Felt to be due to Covid and treatment of same -improving   Severe dehydration with hypernatremia.  Placed on D5 will monitor.  COPD - Continue usual bronchodilators with no evidence of an acute exacerbation  HTN - Blood pressure currently well controlled  Chronic atrial fibrillation - Continue amiodarone and Eliquis - rate controlled, transiently will convert Eliquis to full dose Lovenox till D-dimer trends down.  Chronic diastolic CHF - No evidence of volume overload at present and in fact patient likely slightly dehydrated  CKD stage IV - Followed by Dr. Royce Macadamia -creatinine stable/improving  Recent Labs  Lab 10/11/19 0813 10/12/19 0109 10/13/19 0820 10/14/19 0415 10/15/19 0851  CREATININE 2.83* 3.10* 2.87* 2.64* 2.53*    Debility/deconditioning - Disposition will be challenging as family currently unable to care for patient at home due to 2 other sick family members in the home who are requiring high level of care - cont to ambulate and wean O2 - family hopeful to be able to care for her at home within 48-72hrs    Code Status: DNR  Family Communication: son Chrissie Noa - [phone] on 10/15/19 at 12.25 pm - explained grave prognosis, DNR  Status is: Inpatient  Remains inpatient appropriate  because:Inpatient level of care appropriate due to severity of illness   Dispo: The patient is from: Home              Anticipated d/c is to: Unclear              Anticipated d/c date is: 2 days              Patient currently is not medically stable to d/c.    Consultants:  none  Objective: Blood pressure 112/83, pulse 92, temperature 97.6 F (36.4 C), temperature source Axillary, resp. rate 19, height _0  (1.626 m), weight 48 kg, SpO2 98 %.  Intake/Output Summary (Last 24 hours) at 10/15/2019 1225 Last data filed at 10/15/2019 0500 Gross per 24 hour  Intake 1593.92 ml  Output --  Net 1593.92 ml   Filed Weights   10/30/2019 1700 10/11/19 1553  Weight: 52.2 kg 48 kg    Examination:  Patient in bed she is obtunded, moving all 4 extremities to painful stimuli moaning, unable to answer questions or follow commands [place].AT,PERRAL Supple Neck,No JVD, No cervical lymphadenopathy appriciated.  Symmetrical Chest wall movement, Good air movement bilaterally, CTAB RRR,No Gallops, Rubs or new Murmurs, No Parasternal Heave +ve B.Sounds, Abd Soft, No tenderness, No organomegaly appriciated, No rebound - guarding or rigidity. No Cyanosis, Clubbing or edema, No new Rash or bruise   CBC: Recent Labs  Lab 10/11/19 0813 10/12/19 0109 10/13/19 0820  WBC 14.6* 15.3* 15.1*  NEUTROABS 13.5* 14.3* 14.3*  HGB 11.2* 11.4* 10.9*  HCT 36.3 35.8* 34.8*  MCV 95.0 93.7 94.6  PLT 251 257 433   Basic Metabolic Panel: Recent Labs  Lab 10/13/19 0820 10/14/19 0415 10/15/19 0851  NA 150* 153* 151*  K 3.3* 3.9 4.9  CL 109 114* 117*  CO2 _1 GLUCOSE 148* 129* 142*  BUN 92* 89* 84*  CREATININE 2.87* 2.64* 2.53*  CALCIUM 8.9 9.0 8.6*  MG  --   --  2.7*   GFR: Estimated Creatinine Clearance: 12.1 mL/min (A) (by C-G formula based on SCr of 2.53 mg/dL (H)).  Liver Function Tests: Recent Labs  Lab 10/12/19 0109 10/13/19 0820 10/14/19 0415 10/15/19 0851  AST 104* 64* 53* 49*  ALT  62* 49* 44 37  ALKPHOS 76 76 68 63  BILITOT 0.5 0.9 1.0 0.8  PROT 6.0* 6.0* 5.9* 5.4*  ALBUMIN 2.5* 2.5* 2.6* 2.3*    CBG: Recent Labs  Lab 10/14/19 0759 10/14/19 1159 10/14/19 1714 10/14/19 2107 10/15/19 0833  GLUCAP 129* 111*  128* 89 109*    Recent Results (from the past 240 hour(s))  SARS Coronavirus 2 by RT PCR (hospital order, performed in [facility] hospital lab) Nasopharyngeal Nasopharyngeal Swab     Status: Abnormal   Collection Time: 10/29/2019  5:40 PM   Specimen: Nasopharyngeal Swab  Result Value Ref Range Status   SARS Coronavirus 2 POSITIVE (A) NEGATIVE Final    Comment: RESULT CALLED TO, READ BACK BY AND VERIFIED WITH: RN RUGGIERO MEGAN AT 2035 BY [place] ON 10/25/2019 (NOTE) SARS-CoV-2 target nucleic acids are DETECTED  SARS-CoV-2 RNA is generally detectable in upper respiratory specimens  during the acute phase of infection.  Positive results are indicative  of the presence of the identified virus, but do not rule out bacterial infection or co-infection with other pathogens not detected by the test.  Clinical correlation with patient history and  other diagnostic information is necessary to determine patient infection status.  The expected result is negative.  Fact Sheet for Patients:   StrictlyIdeas.no   Fact Sheet for Healthcare Providers:   BankingDealers.co.za    This test is not yet approved or cleared by the [place] FDA and  has been authorized for detection and/or diagnosis of SARS-CoV-2 by FDA under an Emergency Use Authorization (EUA).  This EUA will remain in  effect (meaning this test can be used) for the duration of  the COVID-19 declaration under Section 564(b)(1) of the Act, 21 U.S.C. section 360-bbb-3(b)(1), unless the authorization is terminated or revoked sooner.  Performed at [facility], [place] [address], [place], [place] [address]   Blood Culture (routine x 2)      Status: None   Collection Time: 10/08/2019  5:40 PM   Specimen: BLOOD  Result Value Ref Range Status   Specimen Description BLOOD LEFT ANTECUBITAL  Final   Special Requests   Final    BOTTLES DRAWN AEROBIC AND ANAEROBIC Blood Culture adequate volume   Culture   Final    NO GROWTH 5 DAYS Performed at [facility], [place] [address] [place], [place] [address]    Report Status 10/14/2019 FINAL  Final  Blood Culture (routine x 2)     Status: None   Collection Time: 10/27/2019  6:49 PM   Specimen: BLOOD  Result Value Ref Range Status   Specimen Description BLOOD RIGHT ANTECUBITAL  Final   Special Requests   Final    BOTTLES DRAWN AEROBIC AND ANAEROBIC Blood Culture adequate volume   Culture   Final    NO GROWTH 5 DAYS Performed at [facility], [place] [address] [place], [place] [address]    Report Status 10/14/2019 FINAL  Final     Scheduled Meds: . acetaminophen  1,000 mg Oral QHS  . allopurinol  100 mg Oral Daily  . amiodarone  200 mg Oral Daily  . apixaban  2.5 mg Oral BID  . atorvastatin  20 mg Oral Daily  . benzonatate  200 mg Oral TID  . calcitRIOL  0.25 mcg Oral Q M,W,F  . carvedilol  6.25 mg Oral BID WC  . cholecalciferol  2,000 Units Oral Daily  . feeding supplement (ENSURE ENLIVE)  237 mL Oral TID BM  . fluticasone furoate-vilanterol  1 puff Inhalation Daily  . insulin aspart  0-15 Units Subcutaneous TID WC  . levothyroxine  75 mcg Oral Q0600  . methylPREDNISolone (SOLU-MEDROL) injection  40 mg Intravenous Daily  . pantoprazole  40 mg Oral Daily  . rOPINIRole  0.5 mg Oral QHS  . senna  1 tablet Oral BID  . vitamin B-12  1,000 mcg Oral Daily     LOS: 5 days   Signature  Lala Lund M.D on 10/15/2019 at 12:25 PM   -  To page go to [email or web address]

## 2019-10-15 NOTE — Progress Notes (Signed)
ANTICOAGULATION CONSULT NOTE - Follow Up Consult  Pharmacy Consult for enoxaparin Indication: atrial fibrillation and increasing D-dimer  Allergies  Allergen Reactions  . Nsaids Other (See Comments)    Sick   . Sulfonamide Derivatives Other (See Comments)    Sick     Patient Measurements: Height: 5\' 4"  (162.6 cm) Weight: 48 kg (105 lb 13.1 oz) IBW/kg (Calculated) : 54.7  Vital Signs: Temp: 97.7 F (36.5 C) (08/14 1237) Temp Source: Axillary (08/14 1237) BP: 125/65 (08/14 1237) Pulse Rate: 113 (08/14 1237)  Labs: Recent Labs    10/13/19 0820 10/14/19 0415 10/15/19 0851  HGB 10.9*  --   --   HCT 34.8*  --   --   PLT 209  --   --   CREATININE 2.87* 2.64* 2.53*    Estimated Creatinine Clearance: 12.1 mL/min (A) (by C-G formula based on SCr of 2.53 mg/dL (H)).   Medications:  Scheduled:  . acetaminophen  1,000 mg Oral QHS  . allopurinol  100 mg Oral Daily  . amiodarone  200 mg Oral Daily  . atorvastatin  20 mg Oral Daily  . benzonatate  200 mg Oral TID  . calcitRIOL  0.25 mcg Oral Q M,W,F  . carvedilol  6.25 mg Oral BID WC  . cholecalciferol  2,000 Units Oral Daily  . feeding supplement (ENSURE ENLIVE)  237 mL Oral TID BM  . fluticasone furoate-vilanterol  1 puff Inhalation Daily  . insulin aspart  0-15 Units Subcutaneous TID WC  . levothyroxine  75 mcg Oral Q0600  . methylPREDNISolone (SOLU-MEDROL) injection  40 mg Intravenous Daily  . pantoprazole  40 mg Oral Daily  . rOPINIRole  0.5 mg Oral QHS  . senna  1 tablet Oral BID  . vitamin B-12  1,000 mcg Oral Daily    Assessment: 89 yof who presented with worsening weakness and SOB - found to have COVID. On apixaban PTA for hx of Afib (LD 8/14@0957 ).   Had some gradually declining mental status - head CT this afternoon not showing any acute intracranial abnormality (small, chronic R occipital lobe infarct). Scr 2.53 (CrCl 12 mL/min).   Goal of Therapy:  Anti-Xa level 0.6-1 units/ml 4hrs after LMWH dose  given Monitor platelets by anticoagulation protocol: Yes   Plan:  Start enoxaparin 48 mg (1 mg/kg) every 24 hours starting at 1800 Monitor renal fx, CBC, and enoxaparin levels pending Scr trend  Antonietta Jewel, PharmD, BCCCP Clinical Pharmacist  Phone: [phone] 10/15/2019 5:22 PM  Please check AMION for all [place] phone numbers After 10:00 PM, call [place] [phone]

## 2019-10-16 ENCOUNTER — Inpatient Hospital Stay (HOSPITAL_COMMUNITY): Payer: Medicare Other

## 2019-10-16 DIAGNOSIS — Z66 Do not resuscitate: Secondary | ICD-10-CM

## 2019-10-16 DIAGNOSIS — Z7189 Other specified counseling: Secondary | ICD-10-CM

## 2019-10-16 DIAGNOSIS — Z515 Encounter for palliative care: Secondary | ICD-10-CM

## 2019-10-16 LAB — CBC WITH DIFFERENTIAL/PLATELET
Abs Immature Granulocytes: 0.14 10*3/uL — ABNORMAL HIGH (ref 0.00–0.07)
Basophils Absolute: 0 10*3/uL (ref 0.0–0.1)
Basophils Relative: 0 %
Eosinophils Absolute: 0 10*3/uL (ref 0.0–0.5)
Eosinophils Relative: 0 %
HCT: 32.9 % — ABNORMAL LOW (ref 36.0–46.0)
Hemoglobin: 10 g/dL — ABNORMAL LOW (ref 12.0–15.0)
Immature Granulocytes: 1 %
Lymphocytes Relative: 1 %
Lymphs Abs: 0.2 10*3/uL — ABNORMAL LOW (ref 0.7–4.0)
MCH: 29.9 pg (ref 26.0–34.0)
MCHC: 30.4 g/dL (ref 30.0–36.0)
MCV: 98.2 fL (ref 80.0–100.0)
Monocytes Absolute: 0.6 10*3/uL (ref 0.1–1.0)
Monocytes Relative: 4 %
Neutro Abs: 17.2 10*3/uL — ABNORMAL HIGH (ref 1.7–7.7)
Neutrophils Relative %: 94 %
Platelets: 173 10*3/uL (ref 150–400)
RBC: 3.35 MIL/uL — ABNORMAL LOW (ref 3.87–5.11)
RDW: 16 % — ABNORMAL HIGH (ref 11.5–15.5)
WBC: 18.1 10*3/uL — ABNORMAL HIGH (ref 4.0–10.5)
nRBC: 0.1 % (ref 0.0–0.2)

## 2019-10-16 LAB — COMPREHENSIVE METABOLIC PANEL
ALT: 41 U/L (ref 0–44)
AST: 47 U/L — ABNORMAL HIGH (ref 15–41)
Albumin: 2.4 g/dL — ABNORMAL LOW (ref 3.5–5.0)
Alkaline Phosphatase: 61 U/L (ref 38–126)
Anion gap: 8 (ref 5–15)
BUN: 80 mg/dL — ABNORMAL HIGH (ref 8–23)
CO2: 27 mmol/L (ref 22–32)
Calcium: 8.7 mg/dL — ABNORMAL LOW (ref 8.9–10.3)
Chloride: 118 mmol/L — ABNORMAL HIGH (ref 98–111)
Creatinine, Ser: 2.41 mg/dL — ABNORMAL HIGH (ref 0.44–1.00)
GFR calc Af Amer: 20 mL/min — ABNORMAL LOW (ref >60)
GFR calc non Af Amer: 18 mL/min — ABNORMAL LOW (ref >60)
Glucose, Bld: 192 mg/dL — ABNORMAL HIGH (ref 70–99)
Potassium: 3.8 mmol/L (ref 3.5–5.1)
Sodium: 153 mmol/L — ABNORMAL HIGH (ref 135–145)
Total Bilirubin: 1 mg/dL (ref 0.3–1.2)
Total Protein: 5.2 g/dL — ABNORMAL LOW (ref 6.5–8.1)

## 2019-10-16 LAB — BLOOD GAS, ARTERIAL
Acid-base deficit: 0.3 mmol/L (ref 0.0–2.0)
Bicarbonate: 26 mmol/L (ref 20.0–28.0)
Drawn by: 56037
FIO2: 36
O2 Saturation: 92.9 %
Patient temperature: 36.5
pCO2 arterial: 58.4 mmHg — ABNORMAL HIGH (ref 32.0–48.0)
pH, Arterial: 7.267 — ABNORMAL LOW (ref 7.350–7.450)
pO2, Arterial: 82.8 mmHg — ABNORMAL LOW (ref 83.0–108.0)

## 2019-10-16 LAB — AMMONIA: Ammonia: 14 umol/L (ref 9–35)

## 2019-10-16 LAB — BRAIN NATRIURETIC PEPTIDE: B Natriuretic Peptide: 484 pg/mL — ABNORMAL HIGH (ref 0.0–100.0)

## 2019-10-16 LAB — C-REACTIVE PROTEIN: CRP: 1.9 mg/dL — ABNORMAL HIGH (ref <1.0)

## 2019-10-16 LAB — D-DIMER, QUANTITATIVE: D-Dimer, Quant: 3.14 ug/mL-FEU — ABNORMAL HIGH (ref 0.00–0.50)

## 2019-10-16 LAB — MAGNESIUM: Magnesium: 2.6 mg/dL — ABNORMAL HIGH (ref 1.7–2.4)

## 2019-10-16 LAB — GLUCOSE, CAPILLARY
Glucose-Capillary: 142 mg/dL — ABNORMAL HIGH (ref 70–99)
Glucose-Capillary: 127 mg/dL — ABNORMAL HIGH (ref 70–99)

## 2019-10-16 LAB — PROCALCITONIN: Procalcitonin: 0.13 ng/mL

## 2019-10-16 MED ORDER — LEVOTHYROXINE SODIUM 100 MCG/5ML IV SOLN
25.0000 ug | Freq: Every day | INTRAVENOUS | Status: DC
  Administered 2019-10-16: 25 ug via INTRAVENOUS
  Filled 2019-10-16: qty 5

## 2019-10-16 MED ORDER — PANTOPRAZOLE SODIUM 40 MG IV SOLR: 40.0000 mg | INTRAVENOUS | Status: DC

## 2019-10-16 MED ORDER — MORPHINE SULFATE 10 MG/5ML PO SOLN
5.0000 mg | ORAL | Status: DC | PRN
  Administered 2019-10-16: 5 mg via ORAL
  Filled 2019-10-16: qty 4

## 2019-10-16 MED ORDER — BISACODYL 10 MG RE SUPP: 10.0000 mg | Freq: Every day | RECTAL | Status: DC | PRN

## 2019-10-16 MED ORDER — LORAZEPAM 2 MG/ML PO CONC: 2.0000 mg | Freq: Once | ORAL | Status: DC

## 2019-10-16 MED ORDER — METHYLPREDNISOLONE SODIUM SUCC 40 MG IJ SOLR
20.0000 mg | Freq: Every day | INTRAMUSCULAR | Status: DC
  Administered 2019-10-16: 20 mg via INTRAVENOUS
  Filled 2019-10-16: qty 1

## 2019-10-16 MED ORDER — LORAZEPAM 2 MG/ML IJ SOLN
1.0000 mg | Freq: Once | INTRAMUSCULAR | Status: AC
  Administered 2019-10-16: 1 mg via INTRAVENOUS

## 2019-10-16 MED ORDER — CHLORHEXIDINE GLUCONATE CLOTH 2 % EX PADS: 6.0000 | MEDICATED_PAD | Freq: Every day | CUTANEOUS | Status: DC

## 2019-10-16 MED ORDER — HYDROMORPHONE HCL 1 MG/ML IJ SOLN: 0.5000 mg | INTRAMUSCULAR | Status: DC | PRN

## 2019-10-16 MED ORDER — CYANOCOBALAMIN 1000 MCG/ML IJ SOLN
1000.0000 ug | INTRAMUSCULAR | Status: DC
  Filled 2019-10-16: qty 1

## 2019-10-16 MED ORDER — GLYCOPYRROLATE 0.2 MG/ML IJ SOLN
0.1000 mg | Freq: Once | INTRAMUSCULAR | Status: AC
  Administered 2019-10-16: 0.1 mg via INTRAVENOUS
  Filled 2019-10-16: qty 1

## 2019-10-16 MED ORDER — DEXTROSE 5 % IV SOLN: INTRAVENOUS | Status: DC

## 2019-10-16 MED ORDER — LORAZEPAM 2 MG/ML IJ SOLN
0.5000 mg | Freq: Four times a day (QID) | INTRAMUSCULAR | Status: DC | PRN
  Administered 2019-10-16: 1 mg via INTRAVENOUS
  Filled 2019-10-16: qty 1

## 2019-10-16 MED ORDER — BIOTENE DRY MOUTH MT LIQD: 15.0000 mL | OROMUCOSAL | Status: DC | PRN

## 2019-10-16 MED ORDER — ATROPINE SULFATE 1 % OP SOLN
2.0000 [drp] | Freq: Four times a day (QID) | OPHTHALMIC | Status: DC
  Filled 2019-10-16: qty 2

## 2019-10-16 MED ORDER — ACETAMINOPHEN 650 MG RE SUPP: 650.0000 mg | RECTAL | Status: DC | PRN

## 2019-11-02 NOTE — Progress Notes (Signed)
Brenda [name]  JAS:[id number] DOB: 1931/05/21 DOA: 10/10/2019 PCP: Charolette Forward, MD    Brief Narrative:  86 year old with a history of chronic diastolic CHF, COPD, HTN, stage IV CKD, and HLD who presented with a 7-day history of worsening weakness and shortness of breath and was found to be acutely hypoxic with a confirmed Covid pneumonia.  Significant Events:  8/7 admit via [place] with hypoxia  Date of Positive COVID Test:  8/7  Vaccination Status: Unvaccinated  COVID-19 specific Treatment: Steroid 8/8 > Remdesivir 8/8 > 8/12  Antimicrobials:  Rocephin 8/8 > 8/9 Zithromax 8/9  DVT prophylaxis: Eliquis  Subjective:  Patient in bed moaning, still encephalopathic unable to answer questions or follow commands.  She is moving all 4 extremities to painful stimuli and by herself as well.  Assessment & Plan:  Acute hypoxic and now hypercapnic respiratory failure -COVID Pneumonia, has completed a course of remdesivir -continue oxygen supplementation and gentle steroid taper.  Overall prognosis is guarded due to advanced age, frail status and now severe encephalopathy.  PCCM consult appreciated last night who recommend comfort measures if further decline, palliative care will also be involved.  She is now DNR.  Recent Labs  Lab 10/18/2019 1740 10/07/2019 1801 10/23/2019 1802 10/05/2019 1802 10/13/2019 1846 10/28/2019 1848 10/10/19 0700 10/10/19 1144 10/11/19 0813 10/12/19 0109 10/13/19 0820 11-09-2019 0146  CRP  --   --   --   --    < >  --   --  13.2* 9.7* 7.8* 4.8* 1.9*  DDIMER  --   --  1.91*   < >  --   --  2.66*  --  3.57* 4.18* 4.75* 3.14*  BNP  --   --   --   --   --   --   --   --  416.2*  --   --  484.0*  PROCALCITON  --   --  0.31  --   --   --   --   --   --   --   --  0.13  LATICACIDVEN  --  1.4  --   --   --  1.0  --   --   --   --   --   --   SARSCOV2NAA POSITIVE*  --   --   --   --   --   --   --   --   --   --   --    < > = values in this interval not displayed.        Hepatic Function Latest Ref Rng & Units 11/09/2019 10/15/2019 10/14/2019  Total Protein 6.5 - 8.1 g/dL 5.2(L) 5.4(L) 5.9(L)  Albumin 3.5 - 5.0 g/dL 2.4(L) 2.3(L) 2.6(L)  AST 15 - 41 U/L 47(H) 49(H) 53(H)  ALT 0 - 44 U/L 41 37 44  Alk Phosphatase 38 - 126 U/L 61 63 68  Total Bilirubin 0.3 - 1.2 mg/dL 1.0 0.8 1.0  Bilirubin, Direct 0.0 - 0.3 mg/dL - - -   SpO2: 94 % O2 Flow Rate (L/min): 5 L/min   Severe toxic and metabolic encephalopathy with extremely poor mentation.  Obtunded on 10/15/2019 which happened sometime late in the evening on 10/14/2019.  CT negative, supportive care for Covid and dehydration being provided, with her advanced age and extremely frail status prognosis is extremely guarded.  Climbing D-dimer - D-dimer is rapidly rising, will switch from Eliquis to full dose Lovenox, check lower extremity venous  duplex, if stable enough V/Q  however at baseline she is on Eliquis hence long-term management will not change. Due too advanced renal dysfunction not a candidate for CTA.  Transaminitis - Felt to be due to Covid and treatment of same -improving   Severe dehydration with hypernatremia.  Continue D5W and monitor.  COPD - Continue usual bronchodilators with no evidence of an acute exacerbation  HTN - Blood pressure currently well controlled  Chronic atrial fibrillation - Continue amiodarone and Eliquis - rate controlled, transiently will convert Eliquis to full dose Lovenox till D-dimer trends down.  Chronic diastolic CHF - No evidence of volume overload at present and in fact patient likely slightly dehydrated  CKD stage IV - Followed by Dr. Royce Macadamia -creatinine stable/improving  Recent Labs  Lab 10/12/19 0109 10/13/19 0820 10/14/19 0415 10/15/19 0851 27-Oct-2019 0146  CREATININE 3.10* 2.87* 2.64* 2.53* 2.41*    Debility/deconditioning - Disposition will be challenging as family currently unable to care for patient at home due to 2 other sick family members in the  home who are requiring high level of care - cont to ambulate and wean O2 - family hopeful to be able to care for her at home within 48-72hrs    Code Status: DNR  Family Communication: son Chrissie Noa - [phone] on 10/15/19 at 12.25 pm - explained grave prognosis, DNR, discussed again on 2019-10-27.  Status is: Inpatient  Remains inpatient appropriate because:Inpatient level of care appropriate due to severity of illness   Dispo: The patient is from: Home              Anticipated d/c is to: Unclear              Anticipated d/c date is: 2 days              Patient currently is not medically stable to d/c.    Consultants: PCCM, [facility].    Objective: Blood pressure (!) 143/83, pulse 97, temperature 98 F (36.7 C), temperature source Axillary, resp. rate (!) 22, height '5\' 4"'  (1.626 m), weight 48 kg, SpO2 94 %.  Intake/Output Summary (Last 24 hours) at October 27, 2019 1046 Last data filed at October 27, 2019 0600 Gross per 24 hour  Intake 1194.64 ml  Output --  Net 1194.64 ml   Filed Weights   11/01/2019 1700 10/11/19 1553  Weight: 52.2 kg 48 kg    Examination:  Patient in bed she is obtunded, moving all 4 extremities to painful stimuli moaning, unable to answer questions or follow commands [place].AT,PERRAL Supple Neck,No JVD, No cervical lymphadenopathy appriciated.  Symmetrical Chest wall movement, Good air movement bilaterally, CTAB RRR,No Gallops, Rubs or new Murmurs, No Parasternal Heave +ve B.Sounds, Abd Soft, No tenderness, No organomegaly appriciated, No rebound - guarding or rigidity. No Cyanosis, Clubbing or edema, No new Rash or bruise    CBC: Recent Labs  Lab 10/12/19 0109 10/13/19 0820 10-27-19 0146  WBC 15.3* 15.1* 18.1*  NEUTROABS 14.3* 14.3* 17.2*  HGB 11.4* 10.9* 10.0*  HCT 35.8* 34.8* 32.9*  MCV 93.7 94.6 98.2  PLT 257 209 235   Basic Metabolic Panel: Recent Labs  Lab 10/14/19 0415 10/15/19 0851 2019-10-27 0146  NA 153* 151* 153*  K 3.9 4.9 3.8  CL 114*  117* 118*  CO2 '26 23 27  ' GLUCOSE 129* 142* 192*  BUN 89* 84* 80*  CREATININE 2.64* 2.53* 2.41*  CALCIUM 9.0 8.6* 8.7*  MG  --  2.7* 2.6*   GFR: Estimated Creatinine Clearance: 12.7 mL/min (A) (  by C-G formula based on SCr of 2.41 mg/dL (H)).  Liver Function Tests: Recent Labs  Lab 10/13/19 0820 10/14/19 0415 10/15/19 0851 11/08/2019 0146  AST 64* 53* 49* 47*  ALT 49* 44 37 41  ALKPHOS 76 68 63 61  BILITOT 0.9 1.0 0.8 1.0  PROT 6.0* 5.9* 5.4* 5.2*  ALBUMIN 2.5* 2.6* 2.3* 2.4*    CBG: Recent Labs  Lab 10/15/19 0833 10/15/19 1235 10/15/19 1625 10/15/19 2118 Nov 08, 2019 0803  GLUCAP 109* 114* 133* 169* 142*    Recent Results (from the past 240 hour(s))  SARS Coronavirus 2 by RT PCR (hospital order, performed in [facility] hospital lab) Nasopharyngeal Nasopharyngeal Swab     Status: Abnormal   Collection Time: 10/20/2019  5:40 PM   Specimen: Nasopharyngeal Swab  Result Value Ref Range Status   SARS Coronavirus 2 POSITIVE (A) NEGATIVE Final    Comment: RESULT CALLED TO, READ BACK BY AND VERIFIED WITH: RN RUGGIERO MEGAN AT 2035 BY [place] ON 10/31/2019 (NOTE) SARS-CoV-2 target nucleic acids are DETECTED  SARS-CoV-2 RNA is generally detectable in upper respiratory specimens  during the acute phase of infection.  Positive results are indicative  of the presence of the identified virus, but do not rule out bacterial infection or co-infection with other pathogens not detected by the test.  Clinical correlation with patient history and  other diagnostic information is necessary to determine patient infection status.  The expected result is negative.  Fact Sheet for Patients:   StrictlyIdeas.no   Fact Sheet for Healthcare Providers:   BankingDealers.co.za    This test is not yet approved or cleared by the [place] FDA and  has been authorized for detection and/or diagnosis of SARS-CoV-2 by FDA under an Emergency  Use Authorization (EUA).  This EUA will remain in  effect (meaning this test can be used) for the duration of  the COVID-19 declaration under Section 564(b)(1) of the Act, 21 U.S.C. section 360-bbb-3(b)(1), unless the authorization is terminated or revoked sooner.  Performed at [facility], [place] [address] [place], [place] [address]   Blood Culture (routine x 2)     Status: None   Collection Time: 10/14/2019  5:40 PM   Specimen: BLOOD  Result Value Ref Range Status   Specimen Description BLOOD LEFT ANTECUBITAL  Final   Special Requests   Final    BOTTLES DRAWN AEROBIC AND ANAEROBIC Blood Culture adequate volume   Culture   Final    NO GROWTH 5 DAYS Performed at [facility], [place] [address], [place], [place] [address]    Report Status 10/14/2019 FINAL  Final  Blood Culture (routine x 2)     Status: None   Collection Time: 10/08/2019  6:49 PM   Specimen: BLOOD  Result Value Ref Range Status   Specimen Description BLOOD RIGHT ANTECUBITAL  Final   Special Requests   Final    BOTTLES DRAWN AEROBIC AND ANAEROBIC Blood Culture adequate volume   Culture   Final    NO GROWTH 5 DAYS Performed at [facility], [place] [address] [place], [place] [address]    Report Status 10/14/2019 FINAL  Final     Scheduled Meds: . acetaminophen  1,000 mg Oral QHS  . amiodarone  200 mg Oral Daily  . atorvastatin  20 mg Oral Daily  . cyanocobalamin  1,000 mcg Intramuscular Q30 days  . enoxaparin (LOVENOX) injection  1 mg/kg Subcutaneous Q24H  . feeding supplement (ENSURE ENLIVE)  237 mL Oral TID BM  . fluticasone furoate-vilanterol  1 puff Inhalation Daily  . insulin aspart  0-15 Units Subcutaneous TID WC  . levothyroxine  25 mcg Intravenous Daily  . methylPREDNISolone (SOLU-MEDROL) injection  20 mg Intravenous Daily  . pantoprazole (PROTONIX) IV  40 mg Intravenous Q24H  . senna  1 tablet Oral BID     LOS: 6 days   Signature  Lala Lund M.D on 2019-10-20 at 10:46 AM   -  To  page go to [email or web address]

## 2019-11-02 NOTE — Death Summary Note (Signed)
[facility] Death Note                                                                                                                                                                                               Brenda [name], is a 86 y.o. female, DOB - 05/28/1931, WEX:[id number]  Admit date - 11/01/2019   Admitting Physician Neena Rhymes, MD  Outpatient Primary MD for the patient is Charolette Forward, MD  LOS - 6  CC - Fever     Notification: Charolette Forward, MD notified of death of 11-Nov-2019   Date and Time of Death - 11-10-2019 at 15:18 pm  Pronounced by - RN  History of present illness:   Brenda [name] is a 86 y.o. female with a history of - chronic diastolic CHF, COPD, HTN, stage IV CKD, and HLD who presented with a 7-day history of worsening weakness and shortness of breath and was found to be acutely hypoxic with a confirmed Covid pneumonia.  Despite appropriate treatment she developed severe Covid 19 related toxic and metabolic encephalopathy, she stopped eating or drinking became extremely dehydrated and subsequently obtunded.  Palliative care was consulted, discussions were made with family and patient was transition to full comfort care.  She passed away comfortably on 11/10/19 and was pronounced dead by the RN at 15:18 p.m.   Final Diagnoses:  Cause if death - COVID-19 infection, Covid encephalopathy.  Signature  Lala Lund M.D on Nov 11, 2019 at 7:06 AM  [facility]  Office Phone -[phone]  Total clinical and documentation time for today Under 30 minutes   Last Note    Brenda [name]  CHE:[id number] DOB: 16-May-1931 DOA: 10/29/2019 PCP: Charolette Forward, MD    Brief Narrative:  86 year old with a history of chronic diastolic CHF, COPD, HTN, stage IV CKD, and HLD who presented with a 7-day history of worsening weakness and shortness of breath and was found to be  acutely hypoxic with a confirmed Covid pneumonia.  Significant Events:  8/7 admit via [place] with hypoxia  Date of Positive COVID Test:  8/7  Vaccination Status: Unvaccinated  COVID-19 specific Treatment: Steroid 8/8 > Remdesivir 8/8 > 8/12  Antimicrobials:  Rocephin 8/8 > 8/9 Zithromax 8/9  DVT prophylaxis: Eliquis  Subjective:  Patient in bed moaning, still encephalopathic unable to answer questions or follow commands.  She is moving all 4 extremities to painful stimuli and by herself as well.  Assessment & Plan:  Acute hypoxic and now hypercapnic respiratory failure -COVID Pneumonia, has completed a course of remdesivir -continue oxygen supplementation and gentle steroid taper.  Overall prognosis is  guarded due to advanced age, frail status and now severe encephalopathy.  PCCM consult appreciated last night who recommend comfort measures if further decline, palliative care will also be involved.  She is now DNR.  Recent Labs  Lab 10/10/19 1144 10/11/19 0813 10/12/19 0109 10/13/19 0820 2019/10/27 0146  CRP 13.2* 9.7* 7.8* 4.8* 1.9*  DDIMER  --  3.57* 4.18* 4.75* 3.14*  BNP  --  416.2*  --   --  484.0*  PROCALCITON  --   --   --   --  0.13      Hepatic Function Latest Ref Rng & Units 10-27-19 10/15/2019 10/14/2019  Total Protein 6.5 - 8.1 g/dL 5.2(L) 5.4(L) 5.9(L)  Albumin 3.5 - 5.0 g/dL 2.4(L) 2.3(L) 2.6(L)  AST 15 - 41 U/L 47(H) 49(H) 53(H)  ALT 0 - 44 U/L 41 37 44  Alk Phosphatase 38 - 126 U/L 61 63 68  Total Bilirubin 0.3 - 1.2 mg/dL 1.0 0.8 1.0  Bilirubin, Direct 0.0 - 0.3 mg/dL - - -   SpO2: 94 % O2 Flow Rate (L/min): 5 L/min   Severe toxic and metabolic encephalopathy with extremely poor mentation.  Obtunded on 10/15/2019 which happened sometime late in the evening on 10/14/2019.  CT negative, supportive care for Covid and dehydration being provided, with her advanced age and extremely frail status prognosis is extremely guarded.  Climbing D-dimer -  D-dimer is rapidly rising, will switch from Eliquis to full dose Lovenox, check lower extremity venous duplex, if stable enough V/Q  however at baseline she is on Eliquis hence long-term management will not change. Due too advanced renal dysfunction not a candidate for CTA.  Transaminitis - Felt to be due to Covid and treatment of same -improving   Severe dehydration with hypernatremia.  Continue D5W and monitor.  COPD - Continue usual bronchodilators with no evidence of an acute exacerbation  HTN - Blood pressure currently well controlled  Chronic atrial fibrillation - Continue amiodarone and Eliquis - rate controlled, transiently will convert Eliquis to full dose Lovenox till D-dimer trends down.  Chronic diastolic CHF - No evidence of volume overload at present and in fact patient likely slightly dehydrated  CKD stage IV - Followed by Dr. Royce Macadamia -creatinine stable/improving  Recent Labs  Lab 10/12/19 0109 10/13/19 0820 10/14/19 0415 10/15/19 0851 October 27, 2019 0146  CREATININE 3.10* 2.87* 2.64* 2.53* 2.41*    Debility/deconditioning - Disposition will be challenging as family currently unable to care for patient at home due to 2 other sick family members in the home who are requiring high level of care - cont to ambulate and wean O2 - family hopeful to be able to care for her at home within 48-72hrs    Code Status: DNR  Family Communication: son Chrissie Noa - [phone] on 10/15/19 at 12.25 pm - explained grave prognosis, DNR, discussed again on 2019-10-27.  Status is: Inpatient  Remains inpatient appropriate because:Inpatient level of care appropriate due to severity of illness   Dispo: The patient is from: Home              Anticipated d/c is to: Unclear              Anticipated d/c date is: 2 days              Patient currently is not medically stable to d/c.    Consultants: PCCM, [facility].    Objective: Blood pressure (!) 143/83, pulse 97, temperature 98 F (36.7 C),  temperature source Axillary,  resp. rate (!) 22, height _0  (1.626 m), weight 48 kg, SpO2 94 %.  Intake/Output Summary (Last 24 hours) at 10/17/2019 0707 Last data filed at 2019/11/12 1100 Gross per 24 hour  Intake 0 ml  Output --  Net 0 ml   Filed Weights   10/06/2019 1700 10/11/19 1553  Weight: 52.2 kg 48 kg    Examination:  Patient in bed she is obtunded, moving all 4 extremities to painful stimuli moaning, unable to answer questions or follow commands [place].AT,PERRAL Supple Neck,No JVD, No cervical lymphadenopathy appriciated.  Symmetrical Chest wall movement, Good air movement bilaterally, CTAB RRR,No Gallops, Rubs or new Murmurs, No Parasternal Heave +ve B.Sounds, Abd Soft, No tenderness, No organomegaly appriciated, No rebound - guarding or rigidity. No Cyanosis, Clubbing or edema, No new Rash or bruise    CBC: Recent Labs  Lab 10/12/19 0109 10/13/19 0820 2019/11/12 0146  WBC 15.3* 15.1* 18.1*  NEUTROABS 14.3* 14.3* 17.2*  HGB 11.4* 10.9* 10.0*  HCT 35.8* 34.8* 32.9*  MCV 93.7 94.6 98.2  PLT 257 209 588   Basic Metabolic Panel: Recent Labs  Lab 10/14/19 0415 10/15/19 0851 11/12/19 0146  NA 153* 151* 153*  K 3.9 4.9 3.8  CL 114* 117* 118*  CO2 _1 GLUCOSE 129* 142* 192*  BUN 89* 84* 80*  CREATININE 2.64* 2.53* 2.41*  CALCIUM 9.0 8.6* 8.7*  MG  --  2.7* 2.6*   GFR: Estimated Creatinine Clearance: 12.7 mL/min (A) (by C-G formula based on SCr of 2.41 mg/dL (H)).  Liver Function Tests: Recent Labs  Lab 10/13/19 0820 10/14/19 0415 10/15/19 0851 12-Nov-2019 0146  AST 64* 53* 49* 47*  ALT 49* 44 37 41  ALKPHOS 76 68 63 61  BILITOT 0.9 1.0 0.8 1.0  PROT 6.0* 5.9* 5.4* 5.2*  ALBUMIN 2.5* 2.6* 2.3* 2.4*    CBG: Recent Labs  Lab 10/15/19 1235 10/15/19 1625 10/15/19 2118 2019/11/12 0803 11/12/19 1204  GLUCAP 114* 133* 169* 142* 127*    Recent Results (from the past 240 hour(s))  SARS Coronavirus 2 by RT PCR (hospital order, performed in [facility] hospital lab) Nasopharyngeal Nasopharyngeal Swab     Status: Abnormal   Collection Time: 10/18/2019  5:40 PM   Specimen: Nasopharyngeal Swab  Result Value Ref Range Status   SARS Coronavirus 2 POSITIVE (A) NEGATIVE Final    Comment: RESULT CALLED TO, READ BACK BY AND VERIFIED WITH: RN RUGGIERO MEGAN AT 2035 BY [place] ON 10/08/2019 (NOTE) SARS-CoV-2 target nucleic acids are DETECTED  SARS-CoV-2 RNA is generally detectable in upper respiratory specimens  during the acute phase of infection.  Positive results are indicative  of the presence of the identified virus, but do not rule out bacterial infection or co-infection with other pathogens not detected by the test.  Clinical correlation with patient history and  other diagnostic information is necessary to determine patient infection status.  The expected result is negative.  Fact Sheet for Patients:   StrictlyIdeas.no   Fact Sheet for Healthcare Providers:   BankingDealers.co.za    This test is not yet approved or cleared by the [place] FDA and  has been authorized for detection and/or diagnosis of SARS-CoV-2 by FDA under an Emergency Use Authorization (EUA).  This EUA will remain in  effect (meaning this test can be used) for the duration of  the COVID-19 declaration under Section 564(b)(1) of the Act, 21 U.S.C. section 360-bbb-3(b)(1), unless the authorization is terminated or revoked sooner.  Performed  at [facility], [place] [address], [place], [place] [address]   Blood Culture (routine x 2)     Status: None   Collection Time: 10/08/2019  5:40 PM   Specimen: BLOOD  Result Value Ref Range Status   Specimen Description BLOOD LEFT ANTECUBITAL  Final   Special Requests   Final    BOTTLES DRAWN AEROBIC AND ANAEROBIC Blood Culture adequate volume   Culture   Final    NO GROWTH 5 DAYS Performed at [facility], [place] [address] [place], [place]  [address]    Report Status 10/14/2019 FINAL  Final  Blood Culture (routine x 2)     Status: None   Collection Time: 10/22/2019  6:49 PM   Specimen: BLOOD  Result Value Ref Range Status   Specimen Description BLOOD RIGHT ANTECUBITAL  Final   Special Requests   Final    BOTTLES DRAWN AEROBIC AND ANAEROBIC Blood Culture adequate volume   Culture   Final    NO GROWTH 5 DAYS Performed at [facility], [place] [address], [place], [place] [address]    Report Status 10/14/2019 FINAL  Final     Scheduled Meds:    LOS: 6 days   Signature  Lala Lund M.D on 10/17/2019 at 7:07 AM   -  To page go to [email or web address]

## 2019-11-02 NOTE — Progress Notes (Addendum)
Called into patient's room by NT for concerns of apnea. Upon assessment, patient found without pulse or signs of life. Time of death pronounced via ausculation by Patrici Ranks, RN & Gwyneth Sprout, RN at 15:18. Bedside nurse notified. Family to be contacted.

## 2019-11-02 NOTE — Consult Note (Signed)
Palliative Medicine Inpatient Consult Note  Reason for consult:  End of Life  HPI:  Per intake H&P --> 86 year old with a history of chronic diastolic CHF, COPD, HTN, stage IV CKD, and HLD who presented with a 7-day history of worsening weakness and shortness of breath and was found to be acutely hypoxic with a confirmed Covid pneumonia.  Palliative care was asked to get involved to help in end of life conversations.  Clinical Assessment/Goals of Care: I have reviewed medical records including EPIC notes, labs and imaging, received report from bedside RN, assessed the patient.    I called Lucienne Minks to further discuss diagnosis prognosis, GOC, EOL wishes, disposition and options.   I introduced Palliative Medicine as specialized medical care for people living with serious illness. It focuses on providing relief from the symptoms and stress of a serious illness. The goal is to improve quality of life for both the patient and the family.  I asked Chrissie Noa to tell me about his mother. He shares that she was from [facility]. She has lived here throughout her life. She was married twice and is a widow. She has two sons. She use to work at [employer] and also held a job at [employer]. She is a woman of faith and practices within the [facility] denomination.   I asked Chrissie Noa how Janalyn had been doing at home. He shares that she lived with he and his wife. She was able to mobilize and do basic tasks for herself up until last week. "This took it out of her" per Chrissie Noa.  I asked Chrissie Noa what he understood about Mildreds present condition, he stated that the medical doctors had shared that they do not feel she will make it through this. I shared with Chrissie Noa that Blondine had a very poor prognosis even in the best of circumstances.  Chrissie Noa confirms that Morrisa is DNAR/DNI code status. I explained that it would be reasonable at this point in time to consider transitioning focus to  comfort oriented care. We talked about transition to comfort measures in house and what that would entail inclusive of medications to control pain, dyspnea, agitation, nausea, itching, and hiccups.    We discussed stopping all uneccessary measures such as blood draws, needle sticks, and frequent vital signs.   I emphasized that I wish this were not the case and sadly COVID PNA can evolve into a terminal diagnosis. He shared that she has PNA prior and it took her three months to recover.   Offered support through listening.   Discussed the importance of continued conversation with family and their  medical providers regarding overall plan of care and treatment options, ensuring decisions are within the context of the patients values and GOCs.  Decision Maker: Lucienne Minks [facility]) [phone]  SUMMARY OF RECOMMENDATIONS   DNAR/DNI  Initiate comfort measures  Spiritual Support  Code Status/Advance Care Planning: DNAR/DNI   Symptom Management:  Dyspnea: Pain:  - Dilaudid 0.5mg -1mg  IV  Fever:  - Tylenol 650mg  PR Q6H PRN Agitation: Anxiety:  - Lorazepam 0.5mg  - 1mg  IV  Q1H PRN Nausea:  - Zofran 4mg  PO/IV Q6H PRN  Secretions:  - Glycopyrrolate 0.4mg  IV Q4H ATC Dry Eyes:  - Artificial Tears PRN Xerostomia:  - Biotene 23ml PRN  - BID oral care Urinary Retention:  - Bladder scan if > 216ml place and maintain foley  Constipation:  - Bisacodyl 10mg  PR PRN QDay Spiritual:  - Chaplain consult   Palliative Prophylaxis:   Oral  Care, Turn Q2H, Pain control  Additional Recommendations (Limitations, Scope, Preferences):  Comfort Measures   Psycho-social/Spiritual:   Desire for further Chaplaincy support: Yes - [employer]  Additional Recommendations: Education on Hospice   Prognosis: Poor likely hours to days  Discharge Planning: Discharge will be [facility]  PPS: 10%   This conversation/these recommendations were discussed with patient primary care team, Dr.  Candiss Norse  Time In: 1040 Time Out:  1150 Total Time: 30 Greater than 50%  of this time was spent counseling and coordinating care related to the above assessment and plan. __________________________________________________________ Addendum: Upon afternoon assessment patient moaning. Noted to be more dyspneic and appears very uncomfortable.   I spoke to her nurse, Ryta who stated that her IV had infiltrated.   While waiting for the IV Team we transitioned her to oral morphine and oral ativan to get her symptoms under better control. Morphine provided and IV placed by IV team prior to ativan therefore was able to give IV.   Patient observed and appeared more comfortable about thirty minutes thereafter.   Time In:1400 Time Out:  1450 Total Time: 50 Greater than 50%  of this time was spent counseling and coordinating care related to the above assessment and plan.  [place] Team Team Cell Phone: [phone] Please utilize secure chat with additional questions, if there is no response within 30 minutes please call the above phone number  Palliative Medicine Team providers are available by phone from 7am to 7pm daily and can be reached through the team cell phone.  Should this patient require assistance outside of these hours, please call the patient's attending physician.

## 2019-11-02 NOTE — Consult Note (Addendum)
NAME:  Brenda [name], MRN:  [id number], DOB:  Sep 08, 1931, LOS: 6 ADMISSION DATE:  10/10/2019, CONSULTATION DATE:  11/08/19 REFERRING MD:  EDP, CHIEF COMPLAINT:  Covid-19  Brief History   86 year old female with past medical history of HFpEF, COPD, hypertension, stage IV CKD, HL who was admitted on 8/9 with Covid-19 pneumonia.  She was admitted and treated with steroids, remdesivir, Rocephin, Zithromax.  Mental status has gradually been declining.  Overnight on 8/15 became more confused and restless, blood gas obtained showing pH of 7.26, PCO2 58 and PO2 82.  PCCM consulted for ICU transfer and BiPAP, she is currently DNR.  History of present illness   Ms. Brenda [name] is a 86 year old female with past medical history of HFpEF, COPD, hypertension, stage IV CKD, HL who was admitted on 8/9 with Covid-19 pneumonia.  She was admitted and treated with steroids, remdesivir, Rocephin, Zithromax with gradually declining mental status over the last few days.  Was made DNR by family, not ready to transition to comfort care.  Overnight on 8/15 she became more confused and less responsive, ABG obtained showing pH of 7.26, PCO2 58 and PO2 82.  PCCM consulted for possible BiPAP and transfer  Past Medical History   has a past medical history of Allergic rhinitis, Arthritis, Atrial fibrillation ([place]), CAROTID ARTERY DISEASE, Chronic diastolic heart failure due to hypertrophic obstructive cardiomyopathy ([place]), COPD, Edema, Exertional shortness of breath, GASTRITIS, High cholesterol, History of blood transfusion (12/2009), HYPERTENSION, Pneumonia (03/14/2013), and PVD (peripheral vascular disease) ([place]).   Significant Hospital Events   8/9 admit to internal medicine 8/15 PCCM consult  Consults:  PCCM  Procedures:    Significant Diagnostic Tests:  8/14 CT head>> Generalized cerebral atrophy.  Small, chronic right occipital lobe infarct.  No acute intracranial abnormality.  Micro Data:  8/8 SARS-Covid-2>>  positive 8/8 blood cultures x2>> no growth  Antimicrobials:  Azithromycin 8/10 Ceftriaxone 8/8  Interim history/subjective:  Patient moaning, minimally interactive, not in acute respiratory distress, opens eyes and tracking  Objective   Blood pressure (!) 148/76, pulse (!) 114, temperature 97.7 F (36.5 C), temperature source Axillary, resp. rate 20, height 5\' 4"  (1.626 m), weight 48 kg, SpO2 93 %.        Intake/Output Summary (Last 24 hours) at Nov 08, 2019 0511 Last data filed at 11/08/19 0400 Gross per 24 hour  Intake 1044.59 ml  Output --  Net 1044.59 ml   Filed Weights   10/08/2019 1700 10/11/19 1553  Weight: 52.2 kg 48 kg    General: Thin, frail, elderly female moaning, restless HEENT: MM pink/moist Neuro: Awake, continuous moaning, not conversational, not following commands  CV: s1s2 RRR, no m/r/g PULM: Decreased breath sounds throughout GI: soft, bsx4 active  Extremities: warm/dry, no edema  Skin: no rashes or lesions   Resolved Hospital Problem list     Assessment & Plan:   Acute hypoxic and hypercarbic respiratory failure secondary to Covid-19 pneumonia Frail and elderly female on high flow nasal cannula, she is restless and moaning, do not think she will tolerate BiPAP mask and given overall clinical picture do not think she is an appropriate transfer to the intensive care unit.  Family has made her DNR, recommend continued goals of care discussions.     We will sign off, please reengage if there is anything that the critical care service can assist with.    Labs   CBC: Recent Labs  Lab 10/19/2019 1802 10/31/2019 1802 10/10/19 1144 10/11/19 0813 10/12/19 0109 10/13/19 0820 11/08/19 7356  WBC 14.9*   < > 12.0* 14.6* 15.3* 15.1* 18.1*  NEUTROABS 13.6*  --   --  13.5* 14.3* 14.3* 17.2*  HGB 11.4*   < > 11.1* 11.2* 11.4* 10.9* 10.0*  HCT 37.5   < > 36.8 36.3 35.8* 34.8* 32.9*  MCV 97.4   < > 97.4 95.0 93.7 94.6 98.2  PLT 240   < > 238 251 257 209 173    < > = values in this interval not displayed.    Basic Metabolic Panel: Recent Labs  Lab 10/12/19 0109 10/13/19 0820 10/14/19 0415 10/15/19 0851 10-19-19 0146  NA 146* 150* 153* 151* 153*  K 3.5 3.3* 3.9 4.9 3.8  CL 103 109 114* 117* 118*  CO2 29 29 26 23 27   GLUCOSE 150* 148* 129* 142* 192*  BUN 89* 92* 89* 84* 80*  CREATININE 3.10* 2.87* 2.64* 2.53* 2.41*  CALCIUM 8.9 8.9 9.0 8.6* 8.7*  MG  --   --   --  2.7* 2.6*   GFR: Estimated Creatinine Clearance: 12.7 mL/min (A) (by C-G formula based on SCr of 2.41 mg/dL (H)). Recent Labs  Lab 10/12/2019 1801 10/23/2019 1802 10/23/2019 1848 10/10/19 1144 10/11/19 0813 10/12/19 0109 10/13/19 0820 10/19/19 0146  PROCALCITON  --  0.31  --   --   --   --   --   --   WBC  --  14.9*  --    < > 14.6* 15.3* 15.1* 18.1*  LATICACIDVEN 1.4  --  1.0  --   --   --   --   --    < > = values in this interval not displayed.    Liver Function Tests: Recent Labs  Lab 10/12/19 0109 10/13/19 0820 10/14/19 0415 10/15/19 0851 2019/10/19 0146  AST 104* 64* 53* 49* 47*  ALT 62* 49* 44 37 41  ALKPHOS 76 76 68 63 61  BILITOT 0.5 0.9 1.0 0.8 1.0  PROT 6.0* 6.0* 5.9* 5.4* 5.2*  ALBUMIN 2.5* 2.5* 2.6* 2.3* 2.4*   No results for input(s): LIPASE, AMYLASE in the last 168 hours. Recent Labs  Lab 2019-10-19 0146  AMMONIA 14    ABG    Component Value Date/Time   PHART 7.267 (L) October 19, 2019 0020   PCO2ART 58.4 (H) October 19, 2019 0020   PO2ART 82.8 (L) October 19, 2019 0020   HCO3 26.0 10-19-19 0020   TCO2 31 03/17/2013 0138   ACIDBASEDEF 0.3 10/19/2019 0020   O2SAT 92.9 Oct 19, 2019 0020     Coagulation Profile: No results for input(s): INR, PROTIME in the last 168 hours.  Cardiac Enzymes: No results for input(s): CKTOTAL, CKMB, CKMBINDEX, TROPONINI in the last 168 hours.  HbA1C: No results found for: HGBA1C  CBG: Recent Labs  Lab 10/14/19 2107 10/15/19 0833 10/15/19 1235 10/15/19 1625 10/15/19 2118  GLUCAP 89 109* 114* 133* 169*    Review  of Systems:   Unable to obtain secondary to mental status  Past Medical History  She,  has a past medical history of Allergic rhinitis, Arthritis, Atrial fibrillation ([place]), CAROTID ARTERY DISEASE, Chronic diastolic heart failure due to hypertrophic obstructive cardiomyopathy ([place]), COPD, Edema, Exertional shortness of breath, GASTRITIS, High cholesterol, History of blood transfusion (12/2009), HYPERTENSION, Pneumonia (03/14/2013), and PVD (peripheral vascular disease) ([place]).   Surgical History    Past Surgical History:  Procedure Laterality Date   AORTO-FEMORAL BYPASS GRAFT Bilateral 12/08/2009   aortobifemoral bypass 12/08/09 [Other]   APPENDECTOMY     CARDIAC CATHETERIZATION     CATARACT  EXTRACTION W/ INTRAOCULAR LENS  IMPLANT, BILATERAL Bilateral    CHOLECYSTECTOMY     LUMBAR LAMINECTOMY  03/13/2011   Procedure: MICRODISCECTOMY LUMBAR LAMINECTOMY;  Surgeon: Lawrence Santiago [facility];  Location: [place];  Service: Orthopedics;  Laterality: Bilateral;  Lumbar 3-4, lumbar 4-5, lumbar 5-sacrum 1 decompression.   PAROTID GLAND TUMOR EXCISION     PARTIAL GASTRECTOMY     "stomach ulcer"    SHOULDER OPEN ROTATOR CUFF REPAIR Right    TOTAL ABDOMINAL HYSTERECTOMY     "i've had 2; they did a partial then a complete"     Social History   reports that she quit smoking about 9 years ago. Her smoking use included cigarettes. She has a 114.00 pack-year smoking history. She has never used smokeless tobacco. She reports that she does not drink alcohol and does not use drugs.   Family History   Her family history includes Coronary artery disease in her mother and sister; Stroke in her father.   Allergies Allergies  Allergen Reactions   Nsaids Other (See Comments)    Sick    Sulfonamide Derivatives Other (See Comments)    Sick      Home Medications  Prior to Admission medications   Medication Sig Start Date End Date Taking? Authorizing Provider  acetaminophen (TYLENOL) 500 MG tablet  Take 1,000 mg by mouth at bedtime.   Yes [provider]  allopurinol (ZYLOPRIM) 100 MG tablet Take 100 mg by mouth daily. 09/16/19  Yes [provider]  amiodarone (PACERONE) 200 MG tablet Take 200 mg by mouth daily.   Yes [provider]  apixaban (ELIQUIS) 2.5 MG TABS tablet Take 2.5 mg by mouth 2 (two) times daily.   Yes [provider]  atorvastatin (LIPITOR) 20 MG tablet Take 20 mg by mouth daily.   Yes [provider]  calcitRIOL (ROCALTROL) 0.25 MCG capsule Take 0.25 mcg by mouth every Monday, Wednesday, and Friday. 09/16/19  Yes [provider]  carvedilol (COREG) 6.25 MG tablet Take 6.25 mg by mouth 2 (two) times daily. 10/06/19  Yes [provider]  Cholecalciferol (VITAMIN D) 50 MCG (2000 UT) tablet Take 2,000 Units by mouth daily.   Yes [provider]  diphenhydrAMINE (BENADRYL) 25 MG tablet Take 25 mg by mouth at bedtime.   Yes [provider]  fluticasone furoate-vilanterol (BREO ELLIPTA) 200-25 MCG/INH AEPB Inhale 1 puff into the lungs daily.   Yes [provider]  furosemide (LASIX) 40 MG tablet Take 1 tablet (40 mg total) by mouth daily. 03/24/13  Yes Charolette Forward, MD  levothyroxine (EUTHYROX) 75 MCG tablet Take 75 mcg by mouth daily before breakfast.   Yes [provider]  pantoprazole (PROTONIX) 40 MG tablet Take 1 tablet (40 mg total) by mouth daily. 03/24/13  Yes Charolette Forward, MD  rOPINIRole (REQUIP) 0.5 MG tablet Take 0.5 mg by mouth at bedtime. 09/30/19  Yes [provider]  vitamin B-12 (CYANOCOBALAMIN) 1000 MCG tablet Take 1,000 mcg by mouth daily.   Yes [provider]     Critical care time: 40 minutes     CRITICAL CARE Performed by: Otilio Carpen Thoma Paulsen   Total critical care time: 40 minutes  Critical care time was exclusive of separately billable procedures and treating other patients.  Critical care was necessary to treat or prevent imminent or  life-threatening deterioration.  Critical care was time spent personally by me on the following activities: development of treatment plan with patient and/or surrogate as well as nursing,  discussions with consultants, evaluation of patient's response to treatment, examination of patient, obtaining history from patient or surrogate, ordering and performing treatments and interventions, ordering and review of laboratory studies, ordering and review of radiographic studies, pulse oximetry and re-evaluation of patient's condition.  Otilio Carpen Quetzaly Ebner, PA-C

## 2019-11-02 NOTE — Progress Notes (Addendum)
Overnight event  Paged by nursing staff on 8/14 at 11:46 PM as patient appeared confused and restless.  Afebrile.  Slightly tachycardic, requiring 4 L supplemental oxygen.  Head CT done earlier in the day negative for acute intracranial abnormality.  Bladder scan done at this time not suggestive of acute urinary retention.  Ammonia level ordered and pending.  Blood gas on 4 L supplemental oxygen showing pH 7.26, PCO2 58, and PO2 82.  Unable to do BiPAP on 5W, needs ICU transfer.  Critical care has been consulted.  Addendum: UA done earlier today not suggestive of infection.  Urine culture ordered.  Update: Patient is awake but continues to be confused and disoriented. Constantly moaning and not able to give any history.  Able to move all extremities.  Course breath sounds appreciated upon auscultation of the lungs, no wheezing.  No lower extremity edema.  Abdomen soft and nontender to palpation.  Not tachycardic or tachypneic.  Satting above 92% on 4 L supplemental oxygen.  Received a call from critical care.  Patient has been seen by critical care physician and it is felt that she is not a good candidate for BiPAP.  Recommended goals of care discussion with family.  I called and spoke to [name] over the phone.  He confirms that the patient is DNR.  However, based on the conversation, it seems he is not ready to have a goals of care discussion at this time.  Son states he will call again in the morning and have further discussion regarding this matter.

## 2019-11-02 DEATH — deceased
